# Patient Record
Sex: Female | Born: 1940 | Race: White | Hispanic: No | Smoking: Never smoker
Health system: Southern US, Community
[De-identification: ages and names within clinical notes are randomized; demographics above are authoritative.]

## PROBLEM LIST (undated history)

## (undated) DIAGNOSIS — E7800 Pure hypercholesterolemia, unspecified: Secondary | ICD-10-CM

## (undated) DIAGNOSIS — I10 Essential (primary) hypertension: Secondary | ICD-10-CM

## (undated) HISTORY — PX: PACEMAKER PLACEMENT: SHX43

---

## 2010-11-08 DIAGNOSIS — E785 Hyperlipidemia, unspecified: Secondary | ICD-10-CM | POA: Insufficient documentation

## 2010-11-08 DIAGNOSIS — I10 Essential (primary) hypertension: Secondary | ICD-10-CM | POA: Diagnosis present

## 2011-12-24 DIAGNOSIS — Z860100 Personal history of colon polyps, unspecified: Secondary | ICD-10-CM | POA: Insufficient documentation

## 2015-04-21 DIAGNOSIS — M8580 Other specified disorders of bone density and structure, unspecified site: Secondary | ICD-10-CM | POA: Insufficient documentation

## 2015-04-21 DIAGNOSIS — E039 Hypothyroidism, unspecified: Secondary | ICD-10-CM | POA: Insufficient documentation

## 2015-04-21 DIAGNOSIS — D509 Iron deficiency anemia, unspecified: Secondary | ICD-10-CM | POA: Insufficient documentation

## 2015-04-21 DIAGNOSIS — E7801 Familial hypercholesterolemia: Secondary | ICD-10-CM | POA: Insufficient documentation

## 2015-06-20 DIAGNOSIS — K219 Gastro-esophageal reflux disease without esophagitis: Secondary | ICD-10-CM | POA: Insufficient documentation

## 2017-07-10 DIAGNOSIS — R7301 Impaired fasting glucose: Secondary | ICD-10-CM | POA: Insufficient documentation

## 2017-11-19 DIAGNOSIS — I5021 Acute systolic (congestive) heart failure: Secondary | ICD-10-CM | POA: Insufficient documentation

## 2017-11-21 DIAGNOSIS — I429 Cardiomyopathy, unspecified: Secondary | ICD-10-CM

## 2017-11-22 DIAGNOSIS — Z9889 Other specified postprocedural states: Secondary | ICD-10-CM | POA: Insufficient documentation

## 2017-12-06 DIAGNOSIS — Z79899 Other long term (current) drug therapy: Secondary | ICD-10-CM | POA: Insufficient documentation

## 2017-12-06 DIAGNOSIS — R413 Other amnesia: Secondary | ICD-10-CM | POA: Diagnosis present

## 2017-12-16 DIAGNOSIS — G4700 Insomnia, unspecified: Secondary | ICD-10-CM | POA: Insufficient documentation

## 2018-01-10 DIAGNOSIS — E876 Hypokalemia: Secondary | ICD-10-CM

## 2018-01-10 DIAGNOSIS — Z7901 Long term (current) use of anticoagulants: Secondary | ICD-10-CM | POA: Insufficient documentation

## 2018-01-10 DIAGNOSIS — M62838 Other muscle spasm: Secondary | ICD-10-CM | POA: Insufficient documentation

## 2018-01-21 DIAGNOSIS — Z950 Presence of cardiac pacemaker: Secondary | ICD-10-CM | POA: Insufficient documentation

## 2019-07-22 ENCOUNTER — Emergency Department (HOSPITAL_COMMUNITY)
Admission: EM | Admit: 2019-07-22 | Discharge: 2019-07-23 | Disposition: A | Discharge status: Home / Self Care | Payer: Medicare Other | Attending: Emergency Medicine | Admitting: Emergency Medicine

## 2019-07-22 ENCOUNTER — Emergency Department (HOSPITAL_COMMUNITY): Payer: Medicare Other

## 2019-07-22 ENCOUNTER — Encounter (HOSPITAL_COMMUNITY): Payer: Self-pay

## 2019-07-22 DIAGNOSIS — S42292A Other displaced fracture of upper end of left humerus, initial encounter for closed fracture: Secondary | ICD-10-CM | POA: Insufficient documentation

## 2019-07-22 DIAGNOSIS — S43005A Unspecified dislocation of left shoulder joint, initial encounter: Secondary | ICD-10-CM | POA: Diagnosis not present

## 2019-07-22 DIAGNOSIS — W010XXA Fall on same level from slipping, tripping and stumbling without subsequent striking against object, initial encounter: Secondary | ICD-10-CM | POA: Insufficient documentation

## 2019-07-22 DIAGNOSIS — Y939 Activity, unspecified: Secondary | ICD-10-CM | POA: Insufficient documentation

## 2019-07-22 DIAGNOSIS — Y9222 Religious institution as the place of occurrence of the external cause: Secondary | ICD-10-CM | POA: Insufficient documentation

## 2019-07-22 DIAGNOSIS — Y999 Unspecified external cause status: Secondary | ICD-10-CM | POA: Diagnosis not present

## 2019-07-22 DIAGNOSIS — R52 Pain, unspecified: Secondary | ICD-10-CM

## 2019-07-22 DIAGNOSIS — Z950 Presence of cardiac pacemaker: Secondary | ICD-10-CM | POA: Insufficient documentation

## 2019-07-22 DIAGNOSIS — S4992XA Unspecified injury of left shoulder and upper arm, initial encounter: Secondary | ICD-10-CM | POA: Diagnosis present

## 2019-07-22 DIAGNOSIS — S0990XA Unspecified injury of head, initial encounter: Secondary | ICD-10-CM | POA: Diagnosis not present

## 2019-07-22 MED ORDER — OXYCODONE HCL 5 MG PO TABS: 5.0000 mg | ORAL_TABLET | Freq: Three times a day (TID) | ORAL | 0 refills | Status: DC | PRN

## 2019-07-22 MED ORDER — FENTANYL CITRATE (PF) 100 MCG/2ML IJ SOLN
50.0000 ug | Freq: Once | INTRAMUSCULAR | Status: AC
  Administered 2019-07-22: 50 ug via INTRAVENOUS
  Filled 2019-07-22: qty 2

## 2019-07-22 MED ORDER — ONDANSETRON HCL 4 MG/2ML IJ SOLN
4.0000 mg | Freq: Once | INTRAMUSCULAR | Status: AC
  Administered 2019-07-22: 22:00:00 4 mg via INTRAVENOUS
  Filled 2019-07-22: qty 2

## 2019-07-22 MED ORDER — PROPOFOL 10 MG/ML IV BOLUS
0.5000 mg/kg | Freq: Once | INTRAVENOUS | Status: AC
  Administered 2019-07-22: 25 mg via INTRAVENOUS
  Filled 2019-07-22: qty 20

## 2019-07-22 MED ORDER — PROPOFOL 10 MG/ML IV BOLUS
INTRAVENOUS | Status: AC | PRN
  Administered 2019-07-22: 20 mg via INTRAVENOUS
  Administered 2019-07-22 (×2): 25 mg via INTRAVENOUS
  Administered 2019-07-22: 20 mg via INTRAVENOUS

## 2019-07-22 MED ORDER — SODIUM CHLORIDE 0.9 % IV BOLUS
500.0000 mL | Freq: Once | INTRAVENOUS | Status: AC
  Administered 2019-07-22: 500 mL via INTRAVENOUS

## 2019-07-22 NOTE — ED Triage Notes (Signed)
[place]. Pt tripped on lip of door and fell, injuring left upper arm. No obvious deformity. Sling and cushion without relief. + PMS  170palp 100HR 20RR 96% RA 98.2

## 2019-07-22 NOTE — ED Notes (Signed)
Patient transported to X-ray 

## 2019-07-22 NOTE — ED Provider Notes (Addendum)
[facility]-EMERGENCY DEPT Provider Note   CSN: [id number] Arrival date & time: 07/22/19  1939     History Chief Complaint  Patient presents with  . Fall  . Arm Pain    Danielle Blanchard is a 79 y.o. female.  Patient with mechanical fall prior to arrival, landed on left elbow.  Pain to the entire left arm, thinks that she hit her head.  On blood thinners.  Mild headache. No neck pain.   The history is provided by the patient.  Fall This is a new problem. The current episode started less than 1 hour ago. The problem has not changed since onset.Associated symptoms include headaches. Pertinent negatives include no chest pain, no abdominal pain and no shortness of breath. Associated symptoms comments: Left arm pain. Exacerbated by: movement. Nothing relieves the symptoms. She has tried nothing for the symptoms. The treatment provided no relief.  Arm Pain Associated symptoms include headaches. Pertinent negatives include no chest pain, no abdominal pain and no shortness of breath.       History reviewed. No pertinent past medical history.  There are no problems to display for this patient.   Past Surgical History:  Procedure Laterality Date  . PACEMAKER PLACEMENT       OB History   No obstetric history on file.     No family history on file.  Social History   Tobacco Use  . Smoking status: Never Smoker  Substance Use Topics  . Alcohol use: Never  . Drug use: Never    Home Medications Prior to Admission medications   Medication Sig Start Date End Date Taking? Authorizing Provider  oxyCODONE (ROXICODONE) 5 MG immediate release tablet Take 1 tablet (5 mg total) by mouth every 8 (eight) hours as needed for up to 15 doses for severe pain or breakthrough pain. 07/22/19   Virgina Norfolk, DO    Allergies    Patient has no known allergies.  Review of Systems   Review of Systems  Constitutional: Negative for chills and fever.  HENT: Negative for ear  pain and sore throat.   Eyes: Negative for pain and visual disturbance.  Respiratory: Negative for cough and shortness of breath.   Cardiovascular: Negative for chest pain and palpitations.  Gastrointestinal: Negative for abdominal pain and vomiting.  Genitourinary: Negative for dysuria and hematuria.  Musculoskeletal: Positive for arthralgias. Negative for back pain, gait problem, joint swelling, myalgias, neck pain and neck stiffness.  Skin: Negative for color change and rash.  Neurological: Positive for headaches. Negative for seizures and syncope.  All other systems reviewed and are negative.   Physical Exam Updated Vital Signs  ED Triage Vitals  Enc Vitals Group     BP 07/22/19 1952 (!) 165/71     Pulse Rate 07/22/19 1952 60     Resp 07/22/19 1952 16     Temp 07/22/19 1952 98.3 F (36.8 C)     Temp Source 07/22/19 1952 Oral     SpO2 07/22/19 1946 96 %     Weight 07/22/19 1951 145 lb (65.8 kg)     Height 07/22/19 1951 5\' 2"  (1.575 m)     Head Circumference --      Peak Flow --      Pain Score 07/22/19 1951 10     Pain Loc --      Pain Edu? --      Excl. in GC? --     Physical Exam Vitals and nursing note reviewed.  Constitutional:      General: She is not in acute distress.    Appearance: She is well-developed. She is not ill-appearing.  HENT:     Head: Normocephalic and atraumatic.     Nose: Nose normal.     Mouth/Throat:     Mouth: Mucous membranes are moist.  Eyes:     Extraocular Movements: Extraocular movements intact.     Conjunctiva/sclera: Conjunctivae normal.     Pupils: Pupils are equal, round, and reactive to light.  Cardiovascular:     Rate and Rhythm: Normal rate and regular rhythm.     Pulses: Normal pulses.     Heart sounds: Normal heart sounds. No murmur.  Pulmonary:     Effort: Pulmonary effort is normal. No respiratory distress.     Breath sounds: Normal breath sounds.  Abdominal:     Palpations: Abdomen is soft.     Tenderness: There is  no abdominal tenderness.  Musculoskeletal:        General: Tenderness present. No deformity.     Cervical back: Neck supple. No tenderness.     Comments: Tenderness to left shoulder, left elbow, left wrist, overall difficult to assess LUE, decreased ROM of LUE due to pain   Skin:    General: Skin is warm and dry.  Neurological:     General: No focal deficit present.     Mental Status: She is alert and oriented to person, place, and time.     Cranial Nerves: No cranial nerve deficit.     Sensory: No sensory deficit.     Motor: No weakness.     Coordination: Coordination normal.     ED Results / Procedures / Treatments   Labs (all labs ordered are listed, but only abnormal results are displayed) Labs Reviewed - No data to display  EKG None  Radiology DG Chest 2 View  Result Date: 07/22/2019 CLINICAL DATA:  Trip and fall walking into church this evening. Left upper extremity pain most prominent involving the shoulder. 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 Heart size is grossly normal. No pneumothorax, large pleural effusion or focal airspace disease. Left shoulder fracture dislocation better assessed on dedicated imaging. No visualized rib fracture. IMPRESSION: 1. Left proximal humeral fracture dislocation. No other acute chest finding. 2. Large hiatal hernia. Electronically Signed   By: Narda Rutherford M.D.   On: 07/22/2019 21:25   DG Elbow Complete Left  Result Date: 07/22/2019 CLINICAL DATA:  Trip and fall walking into church this evening. Left upper extremity pain most prominent involving the shoulder. EXAM: LEFT ELBOW - COMPLETE 3+ VIEW COMPARISON:  None. FINDINGS: Lateral view is limited by positioning. No obvious joint effusion or acute fracture. No evidence of dislocation. Soft tissues are unremarkable. IMPRESSION: No fracture or subluxation of the left elbow.  Electronically Signed   By: Narda Rutherford M.D.   On: 07/22/2019 21:25   DG Wrist Complete Left  Result Date: 07/22/2019 CLINICAL DATA:  Trip and fall walking into church this evening. Left upper extremity pain most prominent involving the shoulder. EXAM: LEFT WRIST - COMPLETE 3+ VIEW COMPARISON:  None. FINDINGS: There is no evidence of fracture or dislocation. Osteoarthritis involving the radiocarpal joint and base of the thumb at the carpal metacarpal joint. Small erosion involving the ulna styloid. Soft tissues are unremarkable. IMPRESSION: 1. No fracture or subluxation of the left wrist. 2. Mild osteoarthritis. Erosion  involving the ulna styloid is nonspecific, however can be seen in the setting of inflammatory arthropathy such as rheumatoid arthritis. Recommend correlation for any pain prior to acute injury. Electronically Signed   By: Narda RutherfordMelanie  Sanford M.D.   On: 07/22/2019 21:27   CT Head Wo Contrast  Result Date: 07/22/2019 CLINICAL DATA:  Tripped and fell, left-sided head injury EXAM: CT HEAD WITHOUT CONTRAST TECHNIQUE: Contiguous axial images were obtained from the base of the skull through the vertex without intravenous contrast. COMPARISON:  None. FINDINGS: Brain: There is encephalomalacia right temporal lobe consistent with prior infarct. No signs of acute infarct or hemorrhage. Lateral ventricles and midline structures are unremarkable. No acute extra-axial fluid collections. No mass effect. Vascular: No hyperdense vessel or unexpected calcification. Skull: Normal. Negative for fracture or focal lesion. Sinuses/Orbits: No acute finding. Other: None. IMPRESSION: 1. Chronic right temporal lobe infarct. 2. No acute intracranial process. Electronically Signed   By: Sharlet SalinaMichael  Brown M.D.   On: 07/22/2019 21:30   DG Shoulder Left  Result Date: 07/22/2019 CLINICAL DATA:  Trip and fall walking into church this evening. Left upper extremity pain most prominent involving the shoulder. EXAM: LEFT  SHOULDER - 2+ VIEW COMPARISON:  None. FINDINGS: Displaced fracture of the proximal humerus with greater than 1 cm displacement of a 3.4 cm osseous fragment involving the lateral humeral head. There is anterior dislocation of the humeral head with respect to the glenoid. Acromioclavicular joint remains congruent. No fracture of the included left ribs. IMPRESSION: Fracture dislocation of the left shoulder. Anterior displacement of the humerus with comminuted and displaced proximal humeral fracture, greater than 1 cm displacement involves the lateral humeral head. Electronically Signed   By: Narda RutherfordMelanie  Sanford M.D.   On: 07/22/2019 21:23   DG Shoulder Left Port  Result Date: 07/22/2019 CLINICAL DATA:  Post reduction EXAM: LEFT SHOULDER COMPARISON:  07/22/2019 FINDINGS: Reduction of previously noted anterior shoulder dislocation, humeral head appears to articulate with the glenoid on the limited two views. Acute comminuted fracture involving the humeral neck and greater tuberosity. IMPRESSION: Reduction of previously noted humeral head dislocation. Acute comminuted proximal humerus fracture Electronically Signed   By: Jasmine PangKim  Fujinaga M.D.   On: 07/22/2019 23:10   DG Humerus Left  Result Date: 07/22/2019 CLINICAL DATA:  Trip and fall walking into church this evening. Left upper extremity pain most prominent involving the shoulder. EXAM: LEFT HUMERUS - 2+ VIEW COMPARISON:  None. FINDINGS: Anterior dislocation of the humerus with respect to the glenoid. Displaced and comminuted fracture involving the proximal humerus with the displaced fragment of the lateral humeral head. More distal humeral shaft is intact. IMPRESSION: Displaced and comminuted proximal humerus fracture with anterior shoulder dislocation. Electronically Signed   By: Narda RutherfordMelanie  Sanford M.D.   On: 07/22/2019 21:26    Procedures .Sedation  Date/Time: 07/22/2019 9:42 PM Performed by: Virgina Norfolkuratolo, Nathalya Wolanski, DO Authorized by: Virgina Norfolkuratolo, Jamylah Marinaccio, DO   Consent:      Consent obtained:  Written   Consent given by:  Patient   Risks discussed:  Allergic reaction, inadequate sedation, nausea, prolonged hypoxia resulting in organ damage, dysrhythmia, prolonged sedation necessitating reversal, respiratory compromise necessitating ventilatory assistance and intubation and vomiting   Alternatives discussed:  Analgesia without sedation Universal protocol:    Procedure explained and questions answered to patient or proxy's satisfaction: yes     Relevant documents present and verified: yes     Test results available and properly labeled: yes     Imaging studies available: yes  Immediately prior to procedure a time out was called: yes     Patient identity confirmation method:  Arm band, verbally with patient and hospital-assigned identification number Indications:    Procedure performed:  Fracture reduction (fracture/dislocation reduciton)   Procedure necessitating sedation performed by:  Physician performing sedation Pre-sedation assessment:    Time since last food or drink:  6 hours   ASA classification: class 2 - patient with mild systemic disease     Neck mobility: normal     Mouth opening:  3 or more finger widths   Thyromental distance:  4 finger widths   Mallampati score:  II - soft palate, uvula, fauces visible   Pre-sedation assessments completed and reviewed: airway patency, cardiovascular function, hydration status, mental status, nausea/vomiting, pain level, respiratory function and temperature     Pre-sedation assessment completed:  07/22/2019 9:43 PM Immediate pre-procedure details:    Reassessment: Patient reassessed immediately prior to procedure     Reviewed: vital signs, relevant labs/tests and NPO status     Verified: bag valve mask available, emergency equipment available, intubation equipment available, IV patency confirmed, oxygen available, reversal medications available and suction available   Procedure details (see MAR for exact  dosages):    Preoxygenation:  Nasal cannula   Sedation:  Propofol   Intended level of sedation: deep   Intra-procedure monitoring:  Blood pressure monitoring, cardiac monitor, continuous capnometry, continuous pulse oximetry, frequent LOC assessments and frequent vital sign checks   Intra-procedure events: none     Total Provider sedation time (minutes):  25 Post-procedure details:    Post-sedation assessment completed:  07/22/2019 11:10 PM   Attendance: Constant attendance by certified staff until patient recovered     Recovery: Patient returned to pre-procedure baseline     Post-sedation assessments completed and reviewed: airway patency, cardiovascular function, hydration status, mental status, nausea/vomiting, pain level, respiratory function and temperature     Patient is stable for discharge or admission: yes     Patient tolerance:  Tolerated well, no immediate complications  .Ortho Injury Treatment  Date/Time: 07/23/2019 1:01 AM Performed by: Lennice Sites, DO Authorized by: Lennice Sites, DO   Consent:    Consent obtained:  Written   Consent given by:  Patient   Risks discussed:  Irreducible dislocation, nerve damage, fracture, recurrent dislocation, stiffness, restricted joint movement and vascular damage   Alternatives discussed:  No treatmentInjury location: upper arm Location details: left upper arm Injury type: fracture-dislocation Pre-procedure neurovascular assessment: neurovascularly intact Pre-procedure distal perfusion: normal Pre-procedure neurological function: normal Pre-procedure range of motion: reduced  Anesthesia: Local anesthesia used: no  Patient sedated: Yes. Refer to sedation procedure documentation for details of sedation. Manipulation performed: yes Skin traction used: yes Skeletal traction used: yes Reduction successful: yes X-ray confirmed reduction: yes Immobilization: sling Post-procedure neurovascular assessment: post-procedure  neurovascularly intact Post-procedure distal perfusion: normal Post-procedure neurological function: normal Post-procedure range of motion: normal Patient tolerance: patient tolerated the procedure well with no immediate complications    (including critical care time)  Medications Ordered in ED Medications  fentaNYL (SUBLIMAZE) injection 50 mcg (50 mcg Intravenous Given 07/22/19 2041)  propofol (DIPRIVAN) 10 mg/mL bolus/IV push 32.9 mg (25 mg Intravenous Given 07/22/19 2228)  sodium chloride 0.9 % bolus 500 mL (500 mLs Intravenous New Bag/Given 07/22/19 2221)  ondansetron (ZOFRAN) injection 4 mg (4 mg Intravenous Given 07/22/19 2227)  propofol (DIPRIVAN) 10 mg/mL bolus/IV push (20 mg Intravenous Given 07/22/19 2250)    ED Course  I have reviewed the triage  vital signs and the nursing notes.  Pertinent labs & imaging results that were available during my care of the patient were reviewed by me and considered in my medical decision making (see chart for details).    MDM Rules/Calculators/A&P                      Danielle Blanchard is an 79 year old female who presents to the ED with left shoulder pain after a fall.  Patient on blood thinners.  Did not think she hit her head.  However states that she does not fully know.  No loss of consciousness.  Normal vitals.  No fever.  Mostly tenderness to the left shoulder area.  Decreased range of motion secondary to pain.  CT scan of the head was unremarkable.  X-ray of the left shoulder shows fracture dislocation of the shoulder.  Fractures at the proximal humeral head.  Shoulder and fracture were reduced under propofol sedation.  Talked with Dr. Ranell Patrick with orthopaedics who will follow up with the patient in a week.  Patient will wear sling, roxicodone for pain as well as tylenol and motrin.  This chart was dictated using voice recognition software.  Despite best efforts to proofread,  errors can occur which can change the documentation meaning.      Final Clinical Impression(s) / ED Diagnoses Final diagnoses:  Other closed displaced fracture of proximal end of left humerus, initial encounter  Dislocation of left shoulder joint, initial encounter    Rx / DC Orders ED Discharge Orders         Ordered    oxyCODONE (ROXICODONE) 5 MG immediate release tablet  Every 8 hours PRN     07/22/19 2345           Virgina Norfolk, DO 07/22/19 2346    Virgina Norfolk, DO 07/23/19 0102

## 2019-07-22 NOTE — ED Notes (Signed)
Pt is on eliquis, stated she hit the left side of her head.

## 2019-07-22 NOTE — Discharge Instructions (Addendum)
Wear sling at all times, no weightbearing to left upper extremity.  Use Tylenol for pain.  Use Roxicodone for breakthrough pain, be careful with use as this is a narcotic pain medicine, use as prescribed. Recommend ice as well to your shoulder as often as possible. Follow up with Dr. Ranell Patrick with orthopaedics in one week, call to confirm appointment.

## 2019-07-22 NOTE — ED Notes (Signed)
Rainbow sent to lab

## 2019-07-23 NOTE — ED Notes (Signed)
Patient's son informed of patient's discharge. Patient's son stated that he is coming to pick her up.

## 2019-12-05 ENCOUNTER — Emergency Department (HOSPITAL_COMMUNITY)
Admission: EM | Admit: 2019-12-05 | Discharge: 2019-12-06 | Disposition: A | Discharge status: Home / Self Care | Payer: Medicare Other | Attending: Emergency Medicine | Admitting: Emergency Medicine

## 2019-12-05 ENCOUNTER — Emergency Department (HOSPITAL_COMMUNITY): Payer: Medicare Other

## 2019-12-05 ENCOUNTER — Other Ambulatory Visit: Payer: Self-pay

## 2019-12-05 DIAGNOSIS — R079 Chest pain, unspecified: Secondary | ICD-10-CM | POA: Insufficient documentation

## 2019-12-05 DIAGNOSIS — K449 Diaphragmatic hernia without obstruction or gangrene: Secondary | ICD-10-CM

## 2019-12-05 DIAGNOSIS — Z950 Presence of cardiac pacemaker: Secondary | ICD-10-CM | POA: Diagnosis not present

## 2019-12-05 DIAGNOSIS — Z79899 Other long term (current) drug therapy: Secondary | ICD-10-CM | POA: Insufficient documentation

## 2019-12-05 DIAGNOSIS — R109 Unspecified abdominal pain: Secondary | ICD-10-CM | POA: Diagnosis present

## 2019-12-05 LAB — BASIC METABOLIC PANEL
Anion gap: 9 (ref 5–15)
BUN: 14 mg/dL (ref 8–23)
CO2: 25 mmol/L (ref 22–32)
Calcium: 8.8 mg/dL — ABNORMAL LOW (ref 8.9–10.3)
Chloride: 106 mmol/L (ref 98–111)
Creatinine, Ser: 0.87 mg/dL (ref 0.44–1.00)
GFR calc Af Amer: >60 mL/min (ref >60)
GFR calc non Af Amer: >60 mL/min (ref >60)
Glucose, Bld: 129 mg/dL — ABNORMAL HIGH (ref 70–99)
Potassium: 3.3 mmol/L — ABNORMAL LOW (ref 3.5–5.1)
Sodium: 140 mmol/L (ref 135–145)

## 2019-12-05 LAB — CBC
HCT: 34.3 % — ABNORMAL LOW (ref 36.0–46.0)
Hemoglobin: 10.9 g/dL — ABNORMAL LOW (ref 12.0–15.0)
MCH: 29.7 pg (ref 26.0–34.0)
MCHC: 31.8 g/dL (ref 30.0–36.0)
MCV: 93.5 fL (ref 80.0–100.0)
Platelets: 180 10*3/uL (ref 150–400)
RBC: 3.67 MIL/uL — ABNORMAL LOW (ref 3.87–5.11)
RDW: 14.8 % (ref 11.5–15.5)
WBC: 6.9 10*3/uL (ref 4.0–10.5)
nRBC: 0 % (ref 0.0–0.2)

## 2019-12-05 LAB — TROPONIN I (HIGH SENSITIVITY): Troponin I (High Sensitivity): 6 ng/L (ref <18)

## 2019-12-05 NOTE — ED Triage Notes (Addendum)
Pt presents to ED POV. Pt c/o CP that began at 2100. Pt was eating while eating. Pain began in abd, went to chest, back and neck. Pt EMS given nitro x2 and 10mg  morphine. Pt atrial paced, hx SSS  EMS VS -  138/78, intial 190/100 HR 98, intermittent atrial tach

## 2019-12-06 LAB — TROPONIN I (HIGH SENSITIVITY): Troponin I (High Sensitivity): 5 ng/L (ref <18)

## 2019-12-06 MED ORDER — PANTOPRAZOLE SODIUM 40 MG PO TBEC: 40.0000 mg | DELAYED_RELEASE_TABLET | Freq: Every day | ORAL | Status: DC

## 2019-12-06 MED ORDER — ONDANSETRON 4 MG PO TBDP
4.0000 mg | ORAL_TABLET | Freq: Once | ORAL | Status: AC | PRN
  Administered 2019-12-06: 4 mg via ORAL
  Filled 2019-12-06: qty 1

## 2019-12-06 MED ORDER — OMEPRAZOLE 40 MG PO CPDR: 40.0000 mg | DELAYED_RELEASE_CAPSULE | Freq: Every day | ORAL | 1 refills | Status: DC

## 2019-12-06 NOTE — Discharge Instructions (Signed)
1.  Take a 40 mg omeprazole capsule 30 minutes before breakfast every day. 2.  Follow dietary restrictions for low-fat foods, eat smaller meals more frequently. 3.  Make an appointment to see your doctor within the next week for recheck.  You may need referral to a gastroenterologist for an upper endoscopy. 4.  Return to emergency department immediately if you have recurrence of symptoms or new concerning symptoms develop.  Return if you get chest pressure or shortness of breath with walking or other symptoms suggesting heart problems.

## 2019-12-06 NOTE — ED Provider Notes (Signed)
[facility] EMERGENCY DEPARTMENT Provider Note   CSN: [id number] Arrival date & time: 12/05/19  2223     History Chief Complaint  Patient presents with  . Chest Pain    Danielle Blanchard is a 79 y.o. female.  HPI Patient ate dinner.  She had steak and cream potatoes and corn on the cob.  She had finished eating and just very shortly afterward started developing intense pain in her central upper abdomen.  She reports this started radiating slightly lower in her abdomen as well as up into her chest and shoulders.  Pain became very intense.  At that time EMS was called.  Patient denies any vomiting.  She did not have any choking sensation.  She reports she had a similar episode of month or more ago, she got evaluated at [facility].  She reports there did not seem to be any problems with her heart at that time and the episode was attributed to "food poisoning".  Patient reports that she has taken omeprazole in the past.  She is not sure if she takes it on a daily basis any longer.  Patient was treated with nitroglycerin and morphine by EMS.  Symptoms significantly improved.  At this time, patient denies she has any pain.    No past medical history on file.  There are no problems to display for this patient.   Past Surgical History:  Procedure Laterality Date  . PACEMAKER PLACEMENT       OB History   No obstetric history on file.     No family history on file.  Social History   Tobacco Use  . Smoking status: Never Smoker  Substance Use Topics  . Alcohol use: Never  . Drug use: Never    Home Medications Prior to Admission medications   Medication Sig Start Date End Date Taking? Authorizing Provider  oxyCODONE (ROXICODONE) 5 MG immediate release tablet Take 1 tablet (5 mg total) by mouth every 8 (eight) hours as needed for up to 15 doses for severe pain or breakthrough pain. 07/22/19   Virgina Norfolk, DO    Allergies    Patient has no known  allergies.  Review of Systems   Review of Systems 10 systems reviewed and negative except as per HPI Physical Exam Updated Vital Signs BP (!) 150/88   Pulse 78   Temp 98.3 F (36.8 C) (Oral)   Resp 16   SpO2 95%   Physical Exam Constitutional:      Appearance: She is well-developed.  HENT:     Head: Normocephalic and atraumatic.  Eyes:     Extraocular Movements: Extraocular movements intact.     Conjunctiva/sclera: Conjunctivae normal.  Cardiovascular:     Rate and Rhythm: Normal rate and regular rhythm.     Heart sounds: Normal heart sounds.  Pulmonary:     Effort: Pulmonary effort is normal.     Breath sounds: Normal breath sounds.  Abdominal:     General: Bowel sounds are normal. There is no distension.     Palpations: Abdomen is soft.     Tenderness: There is abdominal tenderness.     Comments: Patient has very mild epigastric discomfort to deep palpation.  No guarding.  No mass.  Musculoskeletal:        General: No swelling or tenderness. Normal range of motion.     Cervical back: Neck supple.     Right lower leg: No edema.     Left lower leg: No  edema.  Skin:    General: Skin is warm and dry.  Neurological:     Mental Status: She is alert and oriented to person, place, and time.     GCS: GCS eye subscore is 4. GCS verbal subscore is 5. GCS motor subscore is 6.     Coordination: Coordination normal.  Psychiatric:        Mood and Affect: Mood normal.     ED Results / Procedures / Treatments   Labs (all labs ordered are listed, but only abnormal results are displayed) Labs Reviewed  BASIC METABOLIC PANEL - Abnormal; Notable for the following components:      Result Value   Potassium 3.3 (*)    Glucose, Bld 129 (*)    Calcium 8.8 (*)    All other components within normal limits  CBC - Abnormal; Notable for the following components:   RBC 3.67 (*)    Hemoglobin 10.9 (*)    HCT 34.3 (*)    All other components within normal limits  TROPONIN I (HIGH  SENSITIVITY)  TROPONIN I (HIGH SENSITIVITY)    EKG EKG Interpretation  Date/Time:  Saturday December 05 2019 22:35:13 EDT Ventricular Rate:  61 PR Interval:  210 QRS Duration: 114 QT Interval:  494 QTC Calculation: 497 R Axis:   19 Text Interpretation: Atrial-paced rhythm with prolonged AV conduction Minimal voltage criteria for LVH, may be normal variant ( Cornell product ) Prolonged QT Abnormal ECG No old tracing to compare Confirmed by Drema Pry [phone]) on 12/06/2019 4:55:43 AM   Radiology DG Chest 2 View  Result Date: 12/05/2019 CLINICAL DATA:  Chest pain EXAM: CHEST - 2 VIEW COMPARISON:  07/22/2019 FINDINGS: Left pacer remains in place, unchanged. Large hiatal hernia, stable. Heart is normal size. Lungs clear. No effusions or acute bony abnormality. IMPRESSION: Large hiatal hernia.  No active disease. Electronically Signed   By: Charlett Nose M.D.   On: 12/05/2019 23:24    Procedures Procedures (including critical care time)  Medications Ordered in ED Medications  pantoprazole (PROTONIX) EC tablet 40 mg (has no administration in time range)  ondansetron (ZOFRAN-ODT) disintegrating tablet 4 mg (4 mg Oral Given 12/06/19 0121)    ED Course  I have reviewed the triage vital signs and the nursing notes.  Pertinent labs & imaging results that were available during my care of the patient were reviewed by me and considered in my medical decision making (see chart for details).    MDM Rules/Calculators/A&P                         Patient presents with an episode of severe pain that occurred after eating a large meal.  He does not describe any food impaction.  It started just shortly after completing the meal without difficulty.  Pain started in the central and epigastric abdomen and then began radiating to her central chest.  Patient did not at any time vomit.  Patient was treated by EMS with morphine and nitroglycerin.  She has had prolonged ED wait and pain has resolved.  At  this time, I have high suspicion for hiatal hernia and esophageal spasm as the cause of the patient's symptoms.  2 sets of troponin are normal.  Patient has an EKG with atrial paced and no ischemia.  Chest x-ray shows hiatal hernia but no other acute abnormality.  Abdominal exam has minimal tenderness.  At this time I have much lower suspicion for aneurysm or  dissection given complete resolution of symptoms which initially were severe.  Blood pressures only moderately elevated throughout ED stay.  EMS notes document a initial blood pressure of 190/100 but then subsequently 138/78.  The symptoms resolved and blood pressures hovering in 150s over 80s have low suspicion for hypertensive emergency.  At this time I feel patient stable for discharge.  She is to start omeprazole 40 mg daily and dietary adjustments for reflux and hiatal hernia.  Recommendations follow-up with PCP as well as GI.  Return precautions reviewed. Final Clinical Impression(s) / ED Diagnoses Final diagnoses:  Nonspecific chest pain  Hiatal hernia    Rx / DC Orders ED Discharge Orders    None       Arby Barrette, MD 12/06/19 1123

## 2019-12-06 NOTE — ED Notes (Signed)
Patient verbalizes understanding of discharge instructions. Opportunity for questioning and answers were provided. Armband removed by staff, pt discharged from ED to home with son 

## 2019-12-08 ENCOUNTER — Other Ambulatory Visit: Payer: Medicare Other

## 2019-12-08 ENCOUNTER — Other Ambulatory Visit: Payer: Self-pay

## 2019-12-08 DIAGNOSIS — Z20822 Contact with and (suspected) exposure to covid-19: Secondary | ICD-10-CM

## 2019-12-09 LAB — NOVEL CORONAVIRUS, NAA: SARS-CoV-2, NAA: DETECTED — AB

## 2019-12-10 ENCOUNTER — Telehealth: Payer: Self-pay | Admitting: *Deleted

## 2019-12-10 NOTE — Telephone Encounter (Signed)
Reviewed positive Covid results with the patient's son, Danielle Blanchard. The patient lives with Danielle Blanchard who is also Covid positive. Danielle Blanchard will relay health information and precautions that we discussed to his mother including monitoring her breathing for any changes, encouraging plenty of fluids to stay hydrated, washing hands frequently, disinfecting common areas often and not leaving her designated area with a mask on. Will notify [facility] of results.

## 2019-12-18 ENCOUNTER — Other Ambulatory Visit: Payer: Medicare Other

## 2019-12-18 ENCOUNTER — Other Ambulatory Visit: Payer: Self-pay | Admitting: *Deleted

## 2019-12-18 DIAGNOSIS — Z20822 Contact with and (suspected) exposure to covid-19: Secondary | ICD-10-CM

## 2019-12-21 LAB — NOVEL CORONAVIRUS, NAA: SARS-CoV-2, NAA: DETECTED — AB

## 2020-02-15 ENCOUNTER — Other Ambulatory Visit: Payer: Self-pay

## 2020-02-15 ENCOUNTER — Ambulatory Visit
Admission: EM | Admit: 2020-02-15 | Discharge: 2020-02-15 | Disposition: A | Discharge status: Home / Self Care | Payer: Medicare Other

## 2020-02-15 ENCOUNTER — Encounter: Payer: Self-pay | Admitting: Emergency Medicine

## 2020-02-15 DIAGNOSIS — H18891 Other specified disorders of cornea, right eye: Secondary | ICD-10-CM

## 2020-02-15 DIAGNOSIS — H0102A Squamous blepharitis right eye, upper and lower eyelids: Secondary | ICD-10-CM

## 2020-02-15 HISTORY — DX: Pure hypercholesterolemia, unspecified: E78.00

## 2020-02-15 HISTORY — DX: Essential (primary) hypertension: I10

## 2020-02-15 MED ORDER — SYSTANE 0.4-0.3 % OP GEL: 1.0000 "application " | Freq: Three times a day (TID) | OPHTHALMIC | 0 refills | Status: DC | PRN

## 2020-02-15 NOTE — ED Provider Notes (Signed)
[facility]    CSN: [id number] Arrival date & time: 02/15/20  1244      History   Chief Complaint Chief Complaint  Patient presents with  . Eye Problem    HPI Arlethia Basso is a 79 y.o. female.   79 year old female comes in for few week history of right eye irritation. States foreign body sensation with eye watering. Occasional crusting. Intermittent blurry vision that resolves with blinking. Denies photophobia, eye injury. Denies contact lens use. Has glasses for reading, but does not wear it.      Past Medical History:  Diagnosis Date  . High cholesterol   . Hypertension     There are no problems to display for this patient.   Past Surgical History:  Procedure Laterality Date  . PACEMAKER PLACEMENT      OB History   No obstetric history on file.      Home Medications    Prior to Admission medications   Medication Sig Start Date End Date Taking? Authorizing Provider  apixaban (ELIQUIS) 2.5 MG TABS tablet Take by mouth 2 (two) times daily.   Yes [provider]  carvedilol (COREG) 12.5 MG tablet Take 12.5 mg by mouth 2 (two) times daily with a meal.   Yes [provider]  losartan (COZAAR) 50 MG tablet Take 50 mg by mouth daily.   Yes [provider]  rosuvastatin (CRESTOR) 40 MG tablet Take 40 mg by mouth daily.   Yes [provider]  omeprazole (PRILOSEC) 40 MG capsule Take 1 capsule (40 mg total) by mouth daily. Take 1 tablet in the morning 30 minutes before you eat.  Take daily. 12/06/19   Arby Barrette, MD  Polyethyl Glycol-Propyl Glycol (SYSTANE) 0.4-0.3 % GEL ophthalmic gel Place 1 application into both eyes 3 (three) times daily as needed. 02/15/20   Belinda Fisher, PA-C    Family History No family history on file.  Social History Social History   Tobacco Use  . Smoking status: Never Smoker  . Smokeless tobacco: Never Used  Substance Use Topics  . Alcohol use: Never  . Drug use: Never      Allergies   Patient has no known allergies.   Review of Systems Review of Systems  Reason unable to perform ROS: See HPI as above.     Physical Exam Triage Vital Signs ED Triage Vitals  Enc Vitals Group     BP 02/15/20 1305 114/68     Pulse Rate 02/15/20 1305 68     Resp 02/15/20 1305 16     Temp 02/15/20 1305 98.3 F (36.8 C)     Temp Source 02/15/20 1305 Oral     SpO2 02/15/20 1305 96 %     Weight --      Height --      Head Circumference --      Peak Flow --      Pain Score 02/15/20 1301 5     Pain Loc --      Pain Edu? --      Excl. in GC? --    No data found.  Updated Vital Signs BP 114/68 (BP Location: Left Arm)   Pulse 68   Temp 98.3 F (36.8 C) (Oral)   Resp 16   SpO2 96%   Visual Acuity Right Eye Distance:   20/30 ([place]) Left Eye Distance:   20/30 ([place]) Bilateral Distance:    Right Eye Near:   Left Eye Near:  Bilateral Near:     Physical Exam Constitutional:      General: She is not in acute distress.    Appearance: She is well-developed. She is not ill-appearing, toxic-appearing or diaphoretic.  HENT:     Head: Normocephalic and atraumatic.  Eyes:     Pupils: Pupils are equal, round, and reactive to light.      Comments: Temporal right conjunctival injection. EOMi. PERRLA.   Neurological:     Mental Status: She is alert and oriented to person, place, and time.      UC Treatments / Results  Labs (all labs ordered are listed, but only abnormal results are displayed) Labs Reviewed - No data to display  EKG   Radiology No results found.  Procedures Procedures (including critical care time)  Medications Ordered in UC Medications - No data to display  Initial Impression / Assessment and Plan / UC Course  I have reviewed the triage vital signs and the nursing notes.  Pertinent labs & imaging results that were available during my care of the patient were reviewed by me and considered in my medical decision making (see chart  for details).    Discussed history and exam most consistent with dry eyes/blepharitis. Also ? Blocked tear duct given swelling to the nasal aspect of the eye. For now, will start artificial tears/gel and do warm compresses. Ophthalmology follow up this week. Return precautions given.  Final Clinical Impressions(s) / UC Diagnoses   Final diagnoses:  Corneal irritation of right eye  Squamous blepharitis of upper and lower eyelids of both eyes    ED Prescriptions    Medication Sig Dispense Auth. Provider   Polyethyl Glycol-Propyl Glycol (SYSTANE) 0.4-0.3 % GEL ophthalmic gel Place 1 application into both eyes 3 (three) times daily as needed. 8 mL Belinda Fisher, PA-C     PDMP not reviewed this encounter.   Belinda Fisher, PA-C 02/15/20 1350

## 2020-02-15 NOTE — Discharge Instructions (Signed)
Artificial tear gel as needed. Warm compresses as directed. Follow up with eye doctor this week for recheckMonitor for redness/pain/swelling to the corner of the eye. Monitor for any worsening of symptoms, changes in vision, sensitivity to light, eye swelling, painful eye movement, follow up with ophthalmology for further evaluation.

## 2020-02-15 NOTE — ED Triage Notes (Signed)
States her R eye has been irritated for several weeks. States it feels like something is in it and it is watery. Endorses slightly blurred vision to the R eye

## 2020-12-27 ENCOUNTER — Ambulatory Visit (HOSPITAL_BASED_OUTPATIENT_CLINIC_OR_DEPARTMENT_OTHER)
Admission: RE | Admit: 2020-12-27 | Discharge: 2020-12-27 | Disposition: A | Discharge status: Home / Self Care | Payer: Medicare Other | Source: Ambulatory Visit | Attending: Emergency Medicine | Admitting: Emergency Medicine

## 2020-12-27 ENCOUNTER — Ambulatory Visit
Admission: EM | Admit: 2020-12-27 | Discharge: 2020-12-27 | Disposition: A | Discharge status: Home / Self Care | Payer: Medicare Other | Attending: Emergency Medicine | Admitting: Emergency Medicine

## 2020-12-27 ENCOUNTER — Other Ambulatory Visit: Payer: Self-pay

## 2020-12-27 DIAGNOSIS — M47816 Spondylosis without myelopathy or radiculopathy, lumbar region: Secondary | ICD-10-CM | POA: Diagnosis not present

## 2020-12-27 DIAGNOSIS — M4316 Spondylolisthesis, lumbar region: Secondary | ICD-10-CM | POA: Diagnosis present

## 2020-12-27 DIAGNOSIS — M545 Low back pain, unspecified: Secondary | ICD-10-CM

## 2020-12-27 MED ORDER — DICLOFENAC SODIUM 2 % EX SOLN: 2.0000 | Freq: Two times a day (BID) | CUTANEOUS | 0 refills | Status: DC

## 2020-12-27 MED ORDER — METHYLPREDNISOLONE ACETATE 80 MG/ML IJ SUSP
80.0000 mg | Freq: Once | INTRAMUSCULAR | Status: AC
  Administered 2020-12-27: 80 mg via INTRAMUSCULAR

## 2020-12-27 MED ORDER — METHOCARBAMOL 500 MG PO TABS: 500.0000 mg | ORAL_TABLET | Freq: Every evening | ORAL | 0 refills | Status: DC | PRN

## 2020-12-27 NOTE — ED Triage Notes (Addendum)
Pt reports having a fall about a week ago and now has lower back pain (dull/ sharp) for a bout a week (mid back pain).

## 2020-12-27 NOTE — Discharge Instructions (Signed)
We gave you a shot of Depo-Medrol Tylenol as needed Topical diclofenac solution to back May use over-the-counter lidocaine patches Robaxin at bedtime-this is a muscle relaxer, may cause drowsiness Continue to move, avoid bedrest Please follow-up if not improving or worsening

## 2020-12-28 ENCOUNTER — Encounter (HOSPITAL_BASED_OUTPATIENT_CLINIC_OR_DEPARTMENT_OTHER): Payer: Self-pay

## 2020-12-28 ENCOUNTER — Other Ambulatory Visit: Payer: Self-pay

## 2020-12-28 ENCOUNTER — Emergency Department (HOSPITAL_BASED_OUTPATIENT_CLINIC_OR_DEPARTMENT_OTHER)
Admission: EM | Admit: 2020-12-28 | Discharge: 2020-12-28 | Disposition: A | Discharge status: Home / Self Care | Payer: Medicare Other | Attending: Student | Admitting: Student

## 2020-12-28 ENCOUNTER — Emergency Department (HOSPITAL_BASED_OUTPATIENT_CLINIC_OR_DEPARTMENT_OTHER): Payer: Medicare Other

## 2020-12-28 DIAGNOSIS — S3210XA Unspecified fracture of sacrum, initial encounter for closed fracture: Secondary | ICD-10-CM | POA: Diagnosis not present

## 2020-12-28 DIAGNOSIS — S3992XA Unspecified injury of lower back, initial encounter: Secondary | ICD-10-CM | POA: Diagnosis present

## 2020-12-28 DIAGNOSIS — M545 Low back pain, unspecified: Secondary | ICD-10-CM

## 2020-12-28 DIAGNOSIS — I10 Essential (primary) hypertension: Secondary | ICD-10-CM | POA: Insufficient documentation

## 2020-12-28 DIAGNOSIS — W19XXXA Unspecified fall, initial encounter: Secondary | ICD-10-CM | POA: Diagnosis not present

## 2020-12-28 DIAGNOSIS — R1011 Right upper quadrant pain: Secondary | ICD-10-CM | POA: Diagnosis not present

## 2020-12-28 DIAGNOSIS — Z79899 Other long term (current) drug therapy: Secondary | ICD-10-CM | POA: Insufficient documentation

## 2020-12-28 LAB — CBC WITH DIFFERENTIAL/PLATELET
Abs Immature Granulocytes: 0.03 10*3/uL (ref 0.00–0.07)
Basophils Absolute: 0 10*3/uL (ref 0.0–0.1)
Basophils Relative: 0 %
Eosinophils Absolute: 0.2 10*3/uL (ref 0.0–0.5)
Eosinophils Relative: 2 %
HCT: 35.8 % — ABNORMAL LOW (ref 36.0–46.0)
Hemoglobin: 11.9 g/dL — ABNORMAL LOW (ref 12.0–15.0)
Immature Granulocytes: 0 %
Lymphocytes Relative: 11 %
Lymphs Abs: 0.8 10*3/uL (ref 0.7–4.0)
MCH: 30.7 pg (ref 26.0–34.0)
MCHC: 33.2 g/dL (ref 30.0–36.0)
MCV: 92.5 fL (ref 80.0–100.0)
Monocytes Absolute: 0.6 10*3/uL (ref 0.1–1.0)
Monocytes Relative: 8 %
Neutro Abs: 5.9 10*3/uL (ref 1.7–7.7)
Neutrophils Relative %: 79 %
Platelets: 163 10*3/uL (ref 150–400)
RBC: 3.87 MIL/uL (ref 3.87–5.11)
RDW: 13.5 % (ref 11.5–15.5)
WBC: 7.5 10*3/uL (ref 4.0–10.5)
nRBC: 0 % (ref 0.0–0.2)

## 2020-12-28 LAB — COMPREHENSIVE METABOLIC PANEL
ALT: 26 U/L (ref 0–44)
AST: 29 U/L (ref 15–41)
Albumin: 4.2 g/dL (ref 3.5–5.0)
Alkaline Phosphatase: 81 U/L (ref 38–126)
Anion gap: 9 (ref 5–15)
BUN: 16 mg/dL (ref 8–23)
CO2: 29 mmol/L (ref 22–32)
Calcium: 9.2 mg/dL (ref 8.9–10.3)
Chloride: 96 mmol/L — ABNORMAL LOW (ref 98–111)
Creatinine, Ser: 0.96 mg/dL (ref 0.44–1.00)
GFR, Estimated: 59 mL/min — ABNORMAL LOW (ref >60)
Glucose, Bld: 114 mg/dL — ABNORMAL HIGH (ref 70–99)
Potassium: 3.6 mmol/L (ref 3.5–5.1)
Sodium: 134 mmol/L — ABNORMAL LOW (ref 135–145)
Total Bilirubin: 0.8 mg/dL (ref 0.3–1.2)
Total Protein: 7.7 g/dL (ref 6.5–8.1)

## 2020-12-28 LAB — LIPASE, BLOOD: Lipase: 26 U/L (ref 11–51)

## 2020-12-28 MED ORDER — IOHEXOL 350 MG/ML SOLN
80.0000 mL | Freq: Once | INTRAVENOUS | Status: AC | PRN
  Administered 2020-12-28: 80 mL via INTRAVENOUS

## 2020-12-28 MED ORDER — ACETAMINOPHEN 500 MG PO TABS
1000.0000 mg | ORAL_TABLET | Freq: Once | ORAL | Status: AC
  Administered 2020-12-28: 1000 mg via ORAL
  Filled 2020-12-28: qty 2

## 2020-12-28 NOTE — ED Triage Notes (Signed)
Per pt and son, pt c/o lower back pain x 2-3 day-abd pain today-denies n/v/d-seen at [facility] yesterday for back pain-NAD-to triage in w/c

## 2020-12-28 NOTE — ED Provider Notes (Addendum)
[facility] EMERGENCY DEPARTMENT Provider Note   CSN: [id number] Arrival date & time: 12/28/20  1909     History Chief Complaint  Patient presents with   Back Pain   Abdominal Pain    Danielle Blanchard is a 80 y.o. female with PMH HLD, HTN who presents the emergency department for evaluation of back pain and abdominal pain.  Patient seen in urgent care yesterday with negative traumatic lumbar x-rays and was ultimately discharged with a steroid regimen, but the patient has not picked these medications up yet and has not been started on them.  She represents today because of new abdominal pain that is present in a bandlike distribution over the right and left turns.  She endorses pain over the tailbone after a fall last week.  Denies chest pain, shortness of breath, cough, headache, fever or other systemic symptoms.   Back Pain Associated symptoms: abdominal pain   Associated symptoms: no chest pain, no dysuria and no fever   Abdominal Pain Associated symptoms: no chest pain, no chills, no cough, no dysuria, no fever, no hematuria, no shortness of breath, no sore throat and no vomiting       Past Medical History:  Diagnosis Date   High cholesterol    Hypertension     There are no problems to display for this patient.   Past Surgical History:  Procedure Laterality Date   PACEMAKER PLACEMENT       OB History   No obstetric history on file.     No family history on file.  Social History   Tobacco Use   Smoking status: Never   Smokeless tobacco: Never  Vaping Use   Vaping Use: Never used  Substance Use Topics   Alcohol use: Never   Drug use: Never    Home Medications Prior to Admission medications   Medication Sig Start Date End Date Taking? Authorizing Provider  apixaban (ELIQUIS) 2.5 MG TABS tablet Take by mouth 2 (two) times daily.    [provider]  carvedilol (COREG) 12.5 MG tablet Take 12.5 mg by mouth 2 (two) times daily with a meal.     [provider]  Diclofenac Sodium (PENNSAID) 2 % SOLN Apply 2 Pump topically 2 (two) times daily. 12/27/20   Wieters, Hallie C, PA-C  losartan (COZAAR) 50 MG tablet Take 50 mg by mouth daily.    [provider]  methocarbamol (ROBAXIN) 500 MG tablet Take 1 tablet (500 mg total) by mouth at bedtime as needed for muscle spasms. 12/27/20   Wieters, Hallie C, PA-C  omeprazole (PRILOSEC) 40 MG capsule Take 1 capsule (40 mg total) by mouth daily. Take 1 tablet in the morning 30 minutes before you eat.  Take daily. 12/06/19   Arby Barrette, MD  Polyethyl Glycol-Propyl Glycol (SYSTANE) 0.4-0.3 % GEL ophthalmic gel Place 1 application into both eyes 3 (three) times daily as needed. 02/15/20   Cathie Hoops, Amy V, PA-C  rosuvastatin (CRESTOR) 40 MG tablet Take 40 mg by mouth daily.    [provider]    Allergies    Patient has no known allergies.  Review of Systems   Review of Systems  Constitutional:  Negative for chills and fever.  HENT:  Negative for ear pain and sore throat.   Eyes:  Negative for pain and visual disturbance.  Respiratory:  Negative for cough and shortness of breath.   Cardiovascular:  Negative for chest pain and palpitations.  Gastrointestinal:  Positive for abdominal pain. Negative  for vomiting.  Genitourinary:  Negative for dysuria and hematuria.  Musculoskeletal:  Positive for back pain. Negative for arthralgias.  Skin:  Negative for color change and rash.  Neurological:  Negative for seizures and syncope.  All other systems reviewed and are negative.  Physical Exam Updated Vital Signs BP (!) 178/78 (BP Location: Left Arm)   Pulse 68   Temp 98.4 F (36.9 C) (Oral)   Resp 20   SpO2 99%   Physical Exam Vitals and nursing note reviewed.  Constitutional:      General: She is not in acute distress.    Appearance: She is well-developed.  HENT:     Head: Normocephalic and atraumatic.  Eyes:     Conjunctiva/sclera: Conjunctivae normal.   Cardiovascular:     Rate and Rhythm: Normal rate and regular rhythm.     Heart sounds: No murmur heard. Pulmonary:     Effort: Pulmonary effort is normal. No respiratory distress.     Breath sounds: Normal breath sounds.  Abdominal:     Palpations: Abdomen is soft.     Tenderness: There is abdominal tenderness (Mild epigastric, right upper quadrant).  Musculoskeletal:        General: Tenderness (L-spine, sacrum) present.     Cervical back: Neck supple.  Skin:    General: Skin is warm and dry.  Neurological:     Mental Status: She is alert.    ED Results / Procedures / Treatments   Labs (all labs ordered are listed, but only abnormal results are displayed) Labs Reviewed  COMPREHENSIVE METABOLIC PANEL - Abnormal; Notable for the following components:      Result Value   Sodium 134 (*)    Chloride 96 (*)    Glucose, Bld 114 (*)    GFR, Estimated 59 (*)    All other components within normal limits  CBC WITH DIFFERENTIAL/PLATELET - Abnormal; Notable for the following components:   Hemoglobin 11.9 (*)    HCT 35.8 (*)    All other components within normal limits  LIPASE, BLOOD    EKG None  Radiology DG Lumbar Spine 2-3 Views  Result Date: 12/27/2020 CLINICAL DATA:  Back pain after fall EXAM: LUMBAR SPINE - 2-3 VIEW COMPARISON:  None. FINDINGS: Grade 1 anterolisthesis L4 on L5. Lumbar vertebra demonstrate grossly normal stature. Minimal wedging at T12 of uncertain age. Diffuse degenerative changes. Mild disc space narrowing at L1-L2, L4-L5 and L5-S1. Facet degenerative changes at multiple levels. IMPRESSION: 1. Minimal wedging deformity at T12 of uncertain age. 2. No acute osseous abnormality of the lumbar spine. Grade 1 anterolisthesis L4 on L5 with multilevel degenerative change Electronically Signed   By: Jasmine Pang M.D.   On: 12/27/2020 17:54   CT ABDOMEN PELVIS W CONTRAST  Result Date: 12/28/2020 CLINICAL DATA:  Lower back pain for several days, right upper quadrant  pain EXAM: CT ABDOMEN AND PELVIS WITH CONTRAST TECHNIQUE: Multidetector CT imaging of the abdomen and pelvis was performed using the standard protocol following bolus administration of intravenous contrast. CONTRAST:  43mL OMNIPAQUE IOHEXOL 350 MG/ML SOLN COMPARISON:  None. FINDINGS: Lower chest: No acute pleural or parenchymal lung disease. There is a large hiatal hernia containing the stomach as well as portions of the transverse colon. Hepatobiliary: No focal liver abnormality is seen. Status post cholecystectomy. No biliary dilatation. Pancreas: Unremarkable. No pancreatic ductal dilatation or surrounding inflammatory changes. Spleen: Normal in size without focal abnormality. Adrenals/Urinary Tract: Adrenal glands are unremarkable. Kidneys are normal, without renal calculi, focal lesion,  or hydronephrosis. Bladder is unremarkable. Stomach/Bowel: No bowel obstruction or ileus. Normal appendix central lower abdomen. No bowel wall thickening or inflammatory change. As discussed above, there is a large hiatal hernia containing portions of the transverse colon as well as the majority of the stomach. Vascular/Lymphatic: Aortic atherosclerosis. No enlarged abdominal or pelvic lymph nodes. Reproductive: Uterus and bilateral adnexa are unremarkable. Other: No free fluid or free gas. Small fat containing left inguinal hernia. Musculoskeletal: Stable age indeterminate wedge compression deformity T12. Nonspecific sclerosis within the S1 vertebral body could reflect an insufficiency fracture given underlying osteopenia. Prominent facet hypertrophy throughout the lower lumbar spine greatest at L4-5 and L5-S1. Mild anterolisthesis of L4 on L5 due to degenerative change. Reconstructed images demonstrate no additional findings. IMPRESSION: 1. Large hiatal hernia containing the stomach and a portion of the mid transverse colon. No obstruction. 2. Small fat containing left inguinal hernia. 3. Sclerosis within the S1 vertebral body  which could reflect osteoporotic insufficiency fracture. Stable age-indeterminate T12 anterior wedge compression deformity. 4.  Aortic Atherosclerosis (ICD10-I70.0). Electronically Signed   By: Sharlet Salina M.D.   On: 12/28/2020 22:36   CT L-SPINE NO CHARGE  Result Date: 12/28/2020 CLINICAL DATA:  Low back pain EXAM: CT LUMBAR SPINE WITHOUT CONTRAST TECHNIQUE: Multidetector CT imaging of the lumbar spine was performed without intravenous contrast administration. Multiplanar CT image reconstructions were also generated. COMPARISON:  None. FINDINGS: Segmentation: 5 lumbar type vertebrae. Alignment: Grade 1 anterolisthesis at L4-5 Vertebrae: Mild height loss at T12.  No acute fracture. Paraspinal and other soft tissues: Large hiatal hernia Disc levels: L1-2: No spinal canal stenosis. L2-3: Small disc bulge without spinal canal or neural foraminal stenosis. L3-4: Mild facet hypertrophy without spinal canal stenosis. L4-5: Grade 1 anterolisthesis with severe facet hypertrophy. No stenosis. L5-S1: No spinal canal or neural foraminal stenosis. IMPRESSION: 1. No acute fracture or static subluxation of the lumbar spine. 2. Grade 1 anterolisthesis at L4-5 with severe facet arthrosis, which may be a source of local low back pain. 3. Large hiatal hernia. Electronically Signed   By: Deatra Robinson M.D.   On: 12/28/2020 22:20    Procedures Procedures   Medications Ordered in ED Medications  acetaminophen (TYLENOL) tablet 1,000 mg (1,000 mg Oral Given 12/28/20 2055)  iohexol (OMNIPAQUE) 350 MG/ML injection 80 mL (80 mLs Intravenous Contrast Given 12/28/20 2144)    ED Course  I have reviewed the triage vital signs and the nursing notes.  Pertinent labs & imaging results that were available during my care of the patient were reviewed by me and considered in my medical decision making (see chart for details).    MDM Rules/Calculators/A&P                           For evaluation of back pain and abdominal pain  after a fall.  Physical exam with tenderness over the L-spine and in the sacrum, mild epigastric and right upper quadrant tenderness to palpation on abdominal exam.  Laboratory evaluation largely unremarkable outside of mild hyponatremia 134, hypochloremia 96.  CT abdomen pelvis with a large hiatal hernia, small fat-containing left inguinal hernia and a possible S1 osteoporotic insufficiency fracture as well as an age-indeterminate T12 anterior wedge compression deformity.  CT L-spine recon with grade 1 anterolisthesis at L4-L5 with severe facet arthrosis.  Patient's pain controlled well with Tylenol and I spoke with her son to encourage both of them to follow-up with her primary care physician to ensure  proper pain control as these findings will not require surgery but will require outpatient follow-up.  She was then discharged with outpatient follow-up with instructions to take Tylenol 3 times daily for her pain.  She is also instructed not to fill the prescription for steroids as she is an 80 year old female at high risk for osteoporosis and in the setting of a possible osteoporotic fracture this medication is not appropriate for her. Final Clinical Impression(s) / ED Diagnoses Final diagnoses:  Lumbar back pain  Closed fracture of sacrum, unspecified portion of sacrum, initial encounter [facility])    Rx / DC Orders ED Discharge Orders     None        Raylei Losurdo, Wyn Forster, MD 12/28/20 2343    Glendora Score, MD 12/28/20 2344

## 2020-12-28 NOTE — Discharge Instructions (Signed)
You were seen in the emergency department for evaluation of back pain after a fall.  Your CAT scans today show evidence of a possible small osteoporotic fracture in the sacrum at the level of S1 that likely explains your back pain.  There is also some chronic narrowing of the nerve roots at the L5 level that may also be responsible for the pain.  At this time, these fractures do not require surgery and you are safe for discharge.  Please continue to take Tylenol 3 times daily, 1000 mg for pain control and call your family medicine doctor for follow-up of your pain.

## 2020-12-28 NOTE — ED Provider Notes (Signed)
[facility]    CSN: [id number] Arrival date & time: 12/27/20  1527      History   Chief Complaint Chief Complaint  Patient presents with   Back Pain    HPI Danielle Blanchard is a 80 y.o. female history of hypertension presenting today for evaluation of back pain.  Reports that she has had back pain over the past week.  Does report falling approximately 1 week ago as well as she has been moving into a new apartment.  Reports pain in her lower back and goes across.  Denies radiation into lower legs.  Denies numbness or tingling.  Denies urinary symptoms.  Patient reports being on Eliquis.  HPI  Past Medical History:  Diagnosis Date   High cholesterol    Hypertension     There are no problems to display for this patient.   Past Surgical History:  Procedure Laterality Date   PACEMAKER PLACEMENT      OB History   No obstetric history on file.      Home Medications    Prior to Admission medications   Medication Sig Start Date End Date Taking? Authorizing Provider  Diclofenac Sodium (PENNSAID) 2 % SOLN Apply 2 Pump topically 2 (two) times daily. 12/27/20  Yes Aislee Landgren C, PA-C  methocarbamol (ROBAXIN) 500 MG tablet Take 1 tablet (500 mg total) by mouth at bedtime as needed for muscle spasms. 12/27/20  Yes Jahid Weida C, PA-C  apixaban (ELIQUIS) 2.5 MG TABS tablet Take by mouth 2 (two) times daily.    [provider]  carvedilol (COREG) 12.5 MG tablet Take 12.5 mg by mouth 2 (two) times daily with a meal.    [provider]  losartan (COZAAR) 50 MG tablet Take 50 mg by mouth daily.    [provider]  omeprazole (PRILOSEC) 40 MG capsule Take 1 capsule (40 mg total) by mouth daily. Take 1 tablet in the morning 30 minutes before you eat.  Take daily. 12/06/19   Arby Barrette, MD  Polyethyl Glycol-Propyl Glycol (SYSTANE) 0.4-0.3 % GEL ophthalmic gel Place 1 application into both eyes 3 (three) times daily as needed. 02/15/20   Cathie Hoops, Amy V,  PA-C  rosuvastatin (CRESTOR) 40 MG tablet Take 40 mg by mouth daily.    [provider]    Family History History reviewed. No pertinent family history.  Social History Social History   Tobacco Use   Smoking status: Never   Smokeless tobacco: Never  Substance Use Topics   Alcohol use: Never   Drug use: Never     Allergies   Patient has no known allergies.   Review of Systems Review of Systems  Constitutional:  Negative for fatigue and fever.  Eyes:  Negative for visual disturbance.  Respiratory:  Negative for shortness of breath.   Cardiovascular:  Negative for chest pain.  Gastrointestinal:  Negative for abdominal pain, nausea and vomiting.  Musculoskeletal:  Positive for back pain and myalgias. Negative for arthralgias and joint swelling.  Skin:  Negative for color change, rash and wound.  Neurological:  Negative for dizziness, weakness, light-headedness and headaches.    Physical Exam Triage Vital Signs ED Triage Vitals  Enc Vitals Group     BP 12/27/20 1549 124/77     Pulse Rate 12/27/20 1549 61     Resp 12/27/20 1549 20     Temp 12/27/20 1549 99 F (37.2 C)     Temp Source 12/27/20 1549 Oral     SpO2  12/27/20 1549 95 %     Weight --      Height --      Head Circumference --      Peak Flow --      Pain Score 12/27/20 1545 10     Pain Loc --      Pain Edu? --      Excl. in GC? --    No data found.  Updated Vital Signs BP 124/77 (BP Location: Left Arm)   Pulse 61   Temp 99 F (37.2 C) (Oral)   Resp 20   SpO2 95%   Visual Acuity Right Eye Distance:   Left Eye Distance:   Bilateral Distance:    Right Eye Near:   Left Eye Near:    Bilateral Near:     Physical Exam Vitals and nursing note reviewed.  Constitutional:      Appearance: She is well-developed.     Comments: No acute distress  HENT:     Head: Normocephalic and atraumatic.     Nose: Nose normal.  Eyes:     Conjunctiva/sclera: Conjunctivae normal.  Cardiovascular:      Rate and Rhythm: Normal rate.  Pulmonary:     Effort: Pulmonary effort is normal. No respiratory distress.  Abdominal:     General: There is no distension.  Musculoskeletal:        General: Normal range of motion.     Cervical back: Neck supple.     Comments: Tenderness to palpation along lumbar spine midline diffusely and bilateral paraspinal musculature, no palpable deformity or step-off  Skin:    General: Skin is warm and dry.  Neurological:     Mental Status: She is alert and oriented to person, place, and time.     UC Treatments / Results  Labs (all labs ordered are listed, but only abnormal results are displayed) Labs Reviewed - No data to display  EKG   Radiology DG Lumbar Spine 2-3 Views  Result Date: 12/27/2020 CLINICAL DATA:  Back pain after fall EXAM: LUMBAR SPINE - 2-3 VIEW COMPARISON:  None. FINDINGS: Grade 1 anterolisthesis L4 on L5. Lumbar vertebra demonstrate grossly normal stature. Minimal wedging at T12 of uncertain age. Diffuse degenerative changes. Mild disc space narrowing at L1-L2, L4-L5 and L5-S1. Facet degenerative changes at multiple levels. IMPRESSION: 1. Minimal wedging deformity at T12 of uncertain age. 2. No acute osseous abnormality of the lumbar spine. Grade 1 anterolisthesis L4 on L5 with multilevel degenerative change Electronically Signed   By: Jasmine Pang M.D.   On: 12/27/2020 17:54    Procedures Procedures (including critical care time)  Medications Ordered in UC Medications  methylPREDNISolone acetate (DEPO-MEDROL) injection 80 mg (80 mg Intramuscular Given 12/27/20 1643)    Initial Impression / Assessment and Plan / UC Course  I have reviewed the triage vital signs and the nursing notes.  Pertinent labs & imaging results that were available during my care of the patient were reviewed by me and considered in my medical decision making (see chart for details).     Lumbar back pain-lumbar x-ray pending, will call with results, on  Eliquis, will avoid NSAIDs, provided Depo-Medrol prior to discharge, Tylenol as needed, topical diclofenac, lidocaine patches and Robaxin at bedtime.  Encouraged movement.  Discussed possible follow-up with orthopedics if persistent.  Called son with x-ray results, verbalized understanding.  Discussed strict return precautions. Patient verbalized understanding and is agreeable with plan.  Final Clinical Impressions(s) / UC Diagnoses   Final diagnoses:  Acute bilateral low back pain without sciatica     Discharge Instructions      We gave you a shot of Depo-Medrol Tylenol as needed Topical diclofenac solution to back May use over-the-counter lidocaine patches Robaxin at bedtime-this is a muscle relaxer, may cause drowsiness Continue to move, avoid bedrest Please follow-up if not improving or worsening     ED Prescriptions     Medication Sig Dispense Auth. Provider   Diclofenac Sodium (PENNSAID) 2 % SOLN Apply 2 Pump topically 2 (two) times daily. 112 g Carrolyn Hilmes C, PA-C   methocarbamol (ROBAXIN) 500 MG tablet Take 1 tablet (500 mg total) by mouth at bedtime as needed for muscle spasms. 20 tablet Sumie Remsen, [place] C, PA-C      PDMP not reviewed this encounter.   Lew Dawes, [place] 12/28/20 1118

## 2021-02-11 ENCOUNTER — Inpatient Hospital Stay (HOSPITAL_COMMUNITY)
Admission: EM | Admit: 2021-02-11 | Discharge: 2021-02-16 | DRG: 392 | Disposition: A | Discharge status: Home / Self Care | Payer: Medicare Other | Attending: Internal Medicine | Admitting: Internal Medicine

## 2021-02-11 ENCOUNTER — Other Ambulatory Visit: Payer: Self-pay

## 2021-02-11 ENCOUNTER — Encounter (HOSPITAL_COMMUNITY): Payer: Self-pay | Admitting: Emergency Medicine

## 2021-02-11 ENCOUNTER — Emergency Department (HOSPITAL_COMMUNITY): Payer: Medicare Other

## 2021-02-11 DIAGNOSIS — I509 Heart failure, unspecified: Secondary | ICD-10-CM

## 2021-02-11 DIAGNOSIS — I5032 Chronic diastolic (congestive) heart failure: Secondary | ICD-10-CM | POA: Diagnosis present

## 2021-02-11 DIAGNOSIS — K3189 Other diseases of stomach and duodenum: Secondary | ICD-10-CM | POA: Diagnosis not present

## 2021-02-11 DIAGNOSIS — K449 Diaphragmatic hernia without obstruction or gangrene: Secondary | ICD-10-CM

## 2021-02-11 DIAGNOSIS — K219 Gastro-esophageal reflux disease without esophagitis: Secondary | ICD-10-CM | POA: Diagnosis present

## 2021-02-11 DIAGNOSIS — Z7901 Long term (current) use of anticoagulants: Secondary | ICD-10-CM

## 2021-02-11 DIAGNOSIS — Z20822 Contact with and (suspected) exposure to covid-19: Secondary | ICD-10-CM | POA: Diagnosis present

## 2021-02-11 DIAGNOSIS — E876 Hypokalemia: Secondary | ICD-10-CM

## 2021-02-11 DIAGNOSIS — Z950 Presence of cardiac pacemaker: Secondary | ICD-10-CM

## 2021-02-11 DIAGNOSIS — Z79899 Other long term (current) drug therapy: Secondary | ICD-10-CM

## 2021-02-11 DIAGNOSIS — I4891 Unspecified atrial fibrillation: Secondary | ICD-10-CM

## 2021-02-11 DIAGNOSIS — I110 Hypertensive heart disease with heart failure: Secondary | ICD-10-CM | POA: Diagnosis present

## 2021-02-11 DIAGNOSIS — E7800 Pure hypercholesterolemia, unspecified: Secondary | ICD-10-CM | POA: Diagnosis present

## 2021-02-11 DIAGNOSIS — I495 Sick sinus syndrome: Secondary | ICD-10-CM

## 2021-02-11 DIAGNOSIS — M4854XA Collapsed vertebra, not elsewhere classified, thoracic region, initial encounter for fracture: Secondary | ICD-10-CM | POA: Diagnosis present

## 2021-02-11 DIAGNOSIS — R042 Hemoptysis: Secondary | ICD-10-CM

## 2021-02-11 DIAGNOSIS — R1013 Epigastric pain: Secondary | ICD-10-CM

## 2021-02-11 LAB — COMPREHENSIVE METABOLIC PANEL
ALT: 27 U/L (ref 0–44)
AST: 31 U/L (ref 15–41)
Albumin: 4.3 g/dL (ref 3.5–5.0)
Alkaline Phosphatase: 87 U/L (ref 38–126)
Anion gap: 7 (ref 5–15)
BUN: 17 mg/dL (ref 8–23)
CO2: 27 mmol/L (ref 22–32)
Calcium: 9.1 mg/dL (ref 8.9–10.3)
Chloride: 102 mmol/L (ref 98–111)
Creatinine, Ser: 0.95 mg/dL (ref 0.44–1.00)
GFR, Estimated: 60 mL/min — ABNORMAL LOW (ref >60)
Glucose, Bld: 120 mg/dL — ABNORMAL HIGH (ref 70–99)
Potassium: 2.6 mmol/L — CL (ref 3.5–5.1)
Sodium: 136 mmol/L (ref 135–145)
Total Bilirubin: 0.5 mg/dL (ref 0.3–1.2)
Total Protein: 7.5 g/dL (ref 6.5–8.1)

## 2021-02-11 LAB — CBC WITH DIFFERENTIAL/PLATELET
Abs Immature Granulocytes: 0.02 10*3/uL (ref 0.00–0.07)
Basophils Absolute: 0 10*3/uL (ref 0.0–0.1)
Basophils Relative: 1 %
Eosinophils Absolute: 0.1 10*3/uL (ref 0.0–0.5)
Eosinophils Relative: 2 %
HCT: 34.5 % — ABNORMAL LOW (ref 36.0–46.0)
Hemoglobin: 11.6 g/dL — ABNORMAL LOW (ref 12.0–15.0)
Immature Granulocytes: 0 %
Lymphocytes Relative: 22 %
Lymphs Abs: 1.3 10*3/uL (ref 0.7–4.0)
MCH: 31.2 pg (ref 26.0–34.0)
MCHC: 33.6 g/dL (ref 30.0–36.0)
MCV: 92.7 fL (ref 80.0–100.0)
Monocytes Absolute: 0.5 10*3/uL (ref 0.1–1.0)
Monocytes Relative: 9 %
Neutro Abs: 3.9 10*3/uL (ref 1.7–7.7)
Neutrophils Relative %: 66 %
Platelets: 159 10*3/uL (ref 150–400)
RBC: 3.72 MIL/uL — ABNORMAL LOW (ref 3.87–5.11)
RDW: 15 % (ref 11.5–15.5)
WBC: 5.8 10*3/uL (ref 4.0–10.5)
nRBC: 0 % (ref 0.0–0.2)

## 2021-02-11 LAB — LIPASE, BLOOD: Lipase: 42 U/L (ref 11–51)

## 2021-02-11 MED ORDER — POTASSIUM CHLORIDE 10 MEQ/100ML IV SOLN
10.0000 meq | INTRAVENOUS | Status: AC
  Administered 2021-02-11 – 2021-02-12 (×2): 10 meq via INTRAVENOUS
  Filled 2021-02-11 (×2): qty 100

## 2021-02-11 MED ORDER — FENTANYL CITRATE PF 50 MCG/ML IJ SOSY
50.0000 ug | PREFILLED_SYRINGE | Freq: Once | INTRAMUSCULAR | Status: AC
  Administered 2021-02-11: 50 ug via INTRAVENOUS
  Filled 2021-02-11: qty 1

## 2021-02-11 MED ORDER — IOHEXOL 350 MG/ML SOLN
80.0000 mL | Freq: Once | INTRAVENOUS | Status: AC | PRN
  Administered 2021-02-11: 80 mL via INTRAVENOUS

## 2021-02-11 NOTE — ED Notes (Signed)
Patient transported to CT 

## 2021-02-11 NOTE — ED Triage Notes (Addendum)
Pt from home c/o intermittent LUQ abdominal pain x 2 months. Pain radiates to RUQ and to lower abdomen. Denies radiation to back, shob, n/v, fevers, cough.

## 2021-02-11 NOTE — ED Notes (Signed)
Patient returned from CT

## 2021-02-11 NOTE — ED Provider Notes (Addendum)
[facility] DEPT Provider Note   CSN: [id number] Arrival date & time: 02/11/21  2003     History Chief Complaint  Patient presents with   Abdominal Pain    Danielle Blanchard is a 80 y.o. female.  The history is provided by the patient, a relative and medical records. No language interpreter was used.  Abdominal Pain Pain location:  Epigastric Pain quality: aching   Pain radiates to:  Does not radiate Pain severity:  Severe Onset quality:  Gradual Duration:  3 days Timing:  Constant Progression:  Waxing and waning Chronicity:  Recurrent Context: eating   Relieved by:  Nothing Worsened by:  Nothing Ineffective treatments:  None tried Associated symptoms: constipation and fatigue   Associated symptoms: no chest pain, no chills, no cough, no diarrhea, no dysuria, no fever, no nausea, no shortness of breath and no vomiting       Past Medical History:  Diagnosis Date   High cholesterol    Hypertension     There are no problems to display for this patient.   Past Surgical History:  Procedure Laterality Date   PACEMAKER PLACEMENT       OB History   No obstetric history on file.     No family history on file.  Social History   Tobacco Use   Smoking status: Never   Smokeless tobacco: Never  Vaping Use   Vaping Use: Never used  Substance Use Topics   Alcohol use: Never   Drug use: Never    Home Medications Prior to Admission medications   Medication Sig Start Date End Date Taking? Authorizing Provider  apixaban (ELIQUIS) 2.5 MG TABS tablet Take by mouth 2 (two) times daily.    [provider]  carvedilol (COREG) 12.5 MG tablet Take 12.5 mg by mouth 2 (two) times daily with a meal.    [provider]  Diclofenac Sodium (PENNSAID) 2 % SOLN Apply 2 Pump topically 2 (two) times daily. 12/27/20   Wieters, Hallie C, PA-C  losartan (COZAAR) 50 MG tablet Take 50 mg by mouth daily.    [provider]   methocarbamol (ROBAXIN) 500 MG tablet Take 1 tablet (500 mg total) by mouth at bedtime as needed for muscle spasms. 12/27/20   Wieters, Hallie C, PA-C  omeprazole (PRILOSEC) 40 MG capsule Take 1 capsule (40 mg total) by mouth daily. Take 1 tablet in the morning 30 minutes before you eat.  Take daily. 12/06/19   Charlesetta Shanks, MD  Polyethyl Glycol-Propyl Glycol (SYSTANE) 0.4-0.3 % GEL ophthalmic gel Place 1 application into both eyes 3 (three) times daily as needed. 02/15/20   Tasia Catchings, Amy V, PA-C  rosuvastatin (CRESTOR) 40 MG tablet Take 40 mg by mouth daily.    [provider]    Allergies    Patient has no known allergies.  Review of Systems   Review of Systems  Constitutional:  Positive for fatigue. Negative for chills and fever.  HENT:  Negative for congestion.   Eyes:  Negative for visual disturbance.  Respiratory:  Negative for cough, chest tightness and shortness of breath.   Cardiovascular:  Negative for chest pain.  Gastrointestinal:  Positive for abdominal pain and constipation. Negative for abdominal distention, diarrhea, nausea and vomiting.  Genitourinary:  Negative for dysuria and flank pain.  Musculoskeletal:  Negative for neck pain.  Skin:  Negative for rash and wound.  Neurological:  Negative for light-headedness and headaches.  Psychiatric/Behavioral:  Negative for agitation and confusion.  All other systems reviewed and are negative.  Physical Exam Updated Vital Signs BP 135/69   Pulse 62   Temp 97.9 F (36.6 C) (Oral)   Resp 13   SpO2 100%   Physical Exam Vitals and nursing note reviewed.  Constitutional:      General: She is not in acute distress.    Appearance: She is well-developed. She is not ill-appearing, toxic-appearing or diaphoretic.  HENT:     Head: Normocephalic and atraumatic.  Eyes:     Conjunctiva/sclera: Conjunctivae normal.  Cardiovascular:     Rate and Rhythm: Normal rate and regular rhythm.     Heart sounds: No murmur  heard. Pulmonary:     Effort: Pulmonary effort is normal. No respiratory distress.     Breath sounds: Normal breath sounds.  Abdominal:     General: Abdomen is flat. Bowel sounds are normal. There is no distension.     Palpations: Abdomen is soft.     Tenderness: There is abdominal tenderness in the epigastric area.  Musculoskeletal:     Cervical back: Neck supple.  Skin:    General: Skin is warm and dry.     Capillary Refill: Capillary refill takes less than 2 seconds.  Neurological:     General: No focal deficit present.     Mental Status: She is alert.  Psychiatric:        Mood and Affect: Mood normal.    ED Results / Procedures / Treatments   Labs (all labs ordered are listed, but only abnormal results are displayed) Labs Reviewed  COMPREHENSIVE METABOLIC PANEL - Abnormal; Notable for the following components:      Result Value   Potassium 2.6 (*)    Glucose, Bld 120 (*)    GFR, Estimated 60 (*)    All other components within normal limits  CBC WITH DIFFERENTIAL/PLATELET - Abnormal; Notable for the following components:   RBC 3.72 (*)    Hemoglobin 11.6 (*)    HCT 34.5 (*)    All other components within normal limits  RESP PANEL BY RT-PCR (FLU A&B, COVID) ARPGX2  LIPASE, BLOOD  URINALYSIS, ROUTINE W REFLEX MICROSCOPIC    EKG EKG Interpretation  Date/Time:  Saturday February 11 2021 20:17:42 EST Ventricular Rate:  60 PR Interval:  208 QRS Duration: 114 QT Interval:  485 QTC Calculation: 485 R Axis:   36 Text Interpretation: Atrial-paced rhythm Borderline intraventricular conduction delay Borderline low voltage, extremity leads Minimal ST depression, anterolateral leads When compared to prior, similar appearance. NO STEMI Confirmed by Antony Blackbird [phone]) on 02/11/2021 9:46:05 PM  Radiology CT ABDOMEN PELVIS W CONTRAST  Result Date: 02/11/2021 CLINICAL DATA:  Evaluate for bowel obstruction. Left upper quadrant abdominal pain. EXAM: CT ABDOMEN AND PELVIS  WITH CONTRAST TECHNIQUE: Multidetector CT imaging of the abdomen and pelvis was performed using the standard protocol following bolus administration of intravenous contrast. CONTRAST:  40mL OMNIPAQUE IOHEXOL 350 MG/ML SOLN COMPARISON:  CT abdomen and pelvis 12/28/2020. FINDINGS: Lower chest: No acute abnormality. Hepatobiliary: No focal liver abnormality is seen. Status post cholecystectomy. No biliary dilatation. Pancreas: Unremarkable. No pancreatic ductal dilatation or surrounding inflammatory changes. Spleen: Normal in size without focal abnormality. Adrenals/Urinary Tract: Adrenal glands are unremarkable. Kidneys are normal, without renal calculi, focal lesion, or hydronephrosis. Bladder is unremarkable. Stomach/Bowel: There is sigmoid colon diverticulosis without evidence for diverticulitis. The appendix is likely surgically absent. Small bowel and colon are nondilated. Again seen is a large hiatal hernia. The gastric body and fundus are  now below the diaphragm, new finding. The gastric antrum remains within the hernia. The intrathoracic and intra-abdominal portions of the stomach are dilated with air-fluid levels. There is no pneumatosis or free air. Vascular/Lymphatic: Aortic atherosclerosis. No enlarged abdominal or pelvic lymph nodes. Reproductive: Uterus and bilateral adnexa are unremarkable. Other: No abdominal wall hernia or abnormality. There is a small fat containing left inguinal hernia. There is no ascites. Musculoskeletal: T12 inferior endplate compression fracture has increased when compared to the prior study. IMPRESSION: 1. Large hiatal hernia is again seen. In the interval, the gastric fundus has change locations and is now intraabdominal while the gastric antrum remains in the hernia. Both intrathoracic and intra-abdominal portions of the stomach are dilated with air-fluid levels. 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 Surgical consultation recommended. 2. T12 compression  fracture has increased compared to 12/28/2020. Electronically Signed   By: Ronney Asters M.D.   On: 02/11/2021 23:25    Procedures Procedures   Medications Ordered in ED Medications  potassium chloride 10 mEq in 100 mL IVPB (10 mEq Intravenous New Bag/Given 02/11/21 2309)  fentaNYL (SUBLIMAZE) injection 50 mcg (has no administration in time range)  iohexol (OMNIPAQUE) 350 MG/ML injection 80 mL (80 mLs Intravenous Contrast Given 02/11/21 2254)    ED Course  I have reviewed the triage vital signs and the nursing notes.  Pertinent labs & imaging results that were available during my care of the patient were reviewed by me and considered in my medical decision making (see chart for details).    MDM Rules/Calculators/A&P                           Danielle Blanchard is a 80 y.o. female with a past medical history significant for pacemaker, hypertension, hypercholesterolemia as well as previous hiatal hernia who presents with worsened abdominal pain.  According to patient, she has been having pain on and off for several months but acutely worsened over the last few days.  She reports no nausea, vomiting, or diarrhea.  She denies constipation but it appears she has had visits for constipation recently.  Patient reports the pain is moderate to severe and in the epigastric and upper abdomen primarily.  She is denying chest pain for me.  No shortness of breath reported.  She denies any fevers, chills, congestion, cough, COVID or flu symptoms.  On exam, abdomen is tender to palpation in the upper abdomen primarily.  Lower abdomen was less tender.  Bowel sounds were appreciated.  Lungs were otherwise clear.  Chest nontender.  Intact pulses in extremities.  Clinically I am concerned the patient may have bowel obstruction or some abnormality with her hiatal hernia.  Will give pain medicine and get a CT scan and labs.  Anticipate reassessment after work-up is completed to determine disposition.   11:28  PM CT scan just returned showing evidence air-fluid levels in the stomach and hiatal hernia area with transition from they were last time suspicious for likely gastric volvulus and obstruction.  Surgery consult placed.  Anticipate admission.  11:49 PM Just spoke with Dr. Ninfa Linden with general surgery and he recommends admission to medicine and likely GI consult during the admission.  He does feel that an NG tube needs to be placed or at least have an attempt at placement to try to decompress.  We discussed that this might be a challenge given the concern for acute obstruction and volvulus at the stomach but he requested  an attempt.  Secure chat message sent to GI team to be involved and see patient tomorrow.  NG order placed and patient will be admitted.   Final Clinical Impression(s) / ED Diagnoses Final diagnoses:  Epigastric pain  Acute gastric volvulus   Clinical Impression: 1. Epigastric pain   2. Acute gastric volvulus     Disposition: Admit  This note was prepared with assistance of Dragon voice recognition software. Occasional wrong-word or sound-a-like substitutions may have occurred due to the inherent limitations of voice recognition software.     Isaih Bulger, Canary Brim, MD 02/12/21 0015    Adylee Leonardo, Canary Brim, MD 02/12/21 0030

## 2021-02-12 ENCOUNTER — Encounter (HOSPITAL_COMMUNITY): Payer: Self-pay | Admitting: Internal Medicine

## 2021-02-12 DIAGNOSIS — Z950 Presence of cardiac pacemaker: Secondary | ICD-10-CM | POA: Diagnosis not present

## 2021-02-12 DIAGNOSIS — K3189 Other diseases of stomach and duodenum: Secondary | ICD-10-CM | POA: Diagnosis present

## 2021-02-12 DIAGNOSIS — R1013 Epigastric pain: Secondary | ICD-10-CM | POA: Diagnosis not present

## 2021-02-12 DIAGNOSIS — I495 Sick sinus syndrome: Secondary | ICD-10-CM | POA: Diagnosis present

## 2021-02-12 DIAGNOSIS — I4891 Unspecified atrial fibrillation: Secondary | ICD-10-CM

## 2021-02-12 DIAGNOSIS — I110 Hypertensive heart disease with heart failure: Secondary | ICD-10-CM | POA: Diagnosis present

## 2021-02-12 DIAGNOSIS — I509 Heart failure, unspecified: Secondary | ICD-10-CM | POA: Diagnosis not present

## 2021-02-12 DIAGNOSIS — E876 Hypokalemia: Secondary | ICD-10-CM | POA: Diagnosis present

## 2021-02-12 DIAGNOSIS — K449 Diaphragmatic hernia without obstruction or gangrene: Principal | ICD-10-CM

## 2021-02-12 DIAGNOSIS — Z79899 Other long term (current) drug therapy: Secondary | ICD-10-CM | POA: Diagnosis not present

## 2021-02-12 DIAGNOSIS — E7800 Pure hypercholesterolemia, unspecified: Secondary | ICD-10-CM | POA: Diagnosis present

## 2021-02-12 DIAGNOSIS — K219 Gastro-esophageal reflux disease without esophagitis: Secondary | ICD-10-CM | POA: Diagnosis present

## 2021-02-12 DIAGNOSIS — Z7901 Long term (current) use of anticoagulants: Secondary | ICD-10-CM | POA: Diagnosis not present

## 2021-02-12 DIAGNOSIS — I5032 Chronic diastolic (congestive) heart failure: Secondary | ICD-10-CM | POA: Diagnosis present

## 2021-02-12 DIAGNOSIS — M4854XA Collapsed vertebra, not elsewhere classified, thoracic region, initial encounter for fracture: Secondary | ICD-10-CM | POA: Diagnosis present

## 2021-02-12 DIAGNOSIS — Z20822 Contact with and (suspected) exposure to covid-19: Secondary | ICD-10-CM | POA: Diagnosis present

## 2021-02-12 LAB — COMPREHENSIVE METABOLIC PANEL
ALT: 22 U/L (ref 0–44)
AST: 23 U/L (ref 15–41)
Albumin: 3.4 g/dL — ABNORMAL LOW (ref 3.5–5.0)
Alkaline Phosphatase: 70 U/L (ref 38–126)
Anion gap: 7 (ref 5–15)
BUN: 13 mg/dL (ref 8–23)
CO2: 28 mmol/L (ref 22–32)
Calcium: 8.4 mg/dL — ABNORMAL LOW (ref 8.9–10.3)
Chloride: 103 mmol/L (ref 98–111)
Creatinine, Ser: 0.69 mg/dL (ref 0.44–1.00)
GFR, Estimated: >60 mL/min (ref >60)
Glucose, Bld: 88 mg/dL (ref 70–99)
Potassium: 3.5 mmol/L (ref 3.5–5.1)
Sodium: 138 mmol/L (ref 135–145)
Total Bilirubin: 0.4 mg/dL (ref 0.3–1.2)
Total Protein: 6 g/dL — ABNORMAL LOW (ref 6.5–8.1)

## 2021-02-12 LAB — CBC WITH DIFFERENTIAL/PLATELET
Abs Immature Granulocytes: 0.02 10*3/uL (ref 0.00–0.07)
Basophils Absolute: 0 10*3/uL (ref 0.0–0.1)
Basophils Relative: 1 %
Eosinophils Absolute: 0.1 10*3/uL (ref 0.0–0.5)
Eosinophils Relative: 2 %
HCT: 30.8 % — ABNORMAL LOW (ref 36.0–46.0)
Hemoglobin: 10.3 g/dL — ABNORMAL LOW (ref 12.0–15.0)
Immature Granulocytes: 1 %
Lymphocytes Relative: 22 %
Lymphs Abs: 1 10*3/uL (ref 0.7–4.0)
MCH: 31.3 pg (ref 26.0–34.0)
MCHC: 33.4 g/dL (ref 30.0–36.0)
MCV: 93.6 fL (ref 80.0–100.0)
Monocytes Absolute: 0.4 10*3/uL (ref 0.1–1.0)
Monocytes Relative: 10 %
Neutro Abs: 2.8 10*3/uL (ref 1.7–7.7)
Neutrophils Relative %: 64 %
Platelets: 142 10*3/uL — ABNORMAL LOW (ref 150–400)
RBC: 3.29 MIL/uL — ABNORMAL LOW (ref 3.87–5.11)
RDW: 15 % (ref 11.5–15.5)
WBC: 4.4 10*3/uL (ref 4.0–10.5)
nRBC: 0 % (ref 0.0–0.2)

## 2021-02-12 LAB — URINALYSIS, ROUTINE W REFLEX MICROSCOPIC
Bacteria, UA: NONE SEEN
Bilirubin Urine: NEGATIVE
Glucose, UA: NEGATIVE mg/dL
Hgb urine dipstick: NEGATIVE
Ketones, ur: NEGATIVE mg/dL
Leukocytes,Ua: NEGATIVE
Nitrite: NEGATIVE
Protein, ur: 30 mg/dL — AB
Specific Gravity, Urine: 1.015 (ref 1.005–1.030)
pH: 5 (ref 5.0–8.0)

## 2021-02-12 LAB — APTT
aPTT: 35 seconds (ref 24–36)
aPTT: >200 seconds (ref 24–36)

## 2021-02-12 LAB — RESP PANEL BY RT-PCR (FLU A&B, COVID) ARPGX2
Influenza A by PCR: NEGATIVE
Influenza B by PCR: NEGATIVE
SARS Coronavirus 2 by RT PCR: NEGATIVE

## 2021-02-12 LAB — HEPARIN LEVEL (UNFRACTIONATED): Heparin Unfractionated: >1.1 IU/mL — ABNORMAL HIGH (ref 0.30–0.70)

## 2021-02-12 LAB — MAGNESIUM
Magnesium: 2.2 mg/dL (ref 1.7–2.4)
Magnesium: 2 mg/dL (ref 1.7–2.4)

## 2021-02-12 LAB — PROTIME-INR
INR: 1.5 — ABNORMAL HIGH (ref 0.8–1.2)
Prothrombin Time: 18 seconds — ABNORMAL HIGH (ref 11.4–15.2)

## 2021-02-12 MED ORDER — POTASSIUM CHLORIDE 10 MEQ/100ML IV SOLN
10.0000 meq | INTRAVENOUS | Status: AC
  Administered 2021-02-12 (×3): 10 meq via INTRAVENOUS
  Filled 2021-02-12 (×3): qty 100

## 2021-02-12 MED ORDER — HEPARIN (PORCINE) 25000 UT/250ML-% IV SOLN
950.0000 [IU]/h | INTRAVENOUS | Status: DC
  Administered 2021-02-12: 950 [IU]/h via INTRAVENOUS
  Filled 2021-02-12: qty 250

## 2021-02-12 MED ORDER — HYDRALAZINE HCL 20 MG/ML IJ SOLN: 5.0000 mg | Freq: Four times a day (QID) | INTRAMUSCULAR | Status: DC | PRN

## 2021-02-12 MED ORDER — SODIUM CHLORIDE 0.9 % IV SOLN: INTRAVENOUS | Status: AC

## 2021-02-12 MED ORDER — POTASSIUM CHLORIDE 10 MEQ/100ML IV SOLN
10.0000 meq | INTRAVENOUS | Status: AC
  Administered 2021-02-12 (×5): 10 meq via INTRAVENOUS
  Filled 2021-02-12 (×5): qty 100

## 2021-02-12 MED ORDER — MORPHINE SULFATE (PF) 2 MG/ML IV SOLN
1.0000 mg | INTRAVENOUS | Status: DC | PRN
  Administered 2021-02-12 – 2021-02-15 (×3): 1 mg via INTRAVENOUS
  Filled 2021-02-12 (×3): qty 1

## 2021-02-12 MED ORDER — SODIUM CHLORIDE 0.9 % IV SOLN: INTRAVENOUS | Status: DC | PRN

## 2021-02-12 MED ORDER — HEPARIN BOLUS VIA INFUSION
3500.0000 [IU] | Freq: Once | INTRAVENOUS | Status: AC
  Administered 2021-02-12: 3500 [IU] via INTRAVENOUS
  Filled 2021-02-12: qty 3500

## 2021-02-12 MED ORDER — SODIUM CHLORIDE 0.9 % IV SOLN: INTRAVENOUS | Status: DC

## 2021-02-12 MED ORDER — POTASSIUM CHLORIDE 10 MEQ/100ML IV SOLN
10.0000 meq | INTRAVENOUS | Status: DC
  Administered 2021-02-12: 10 meq via INTRAVENOUS
  Filled 2021-02-12: qty 100

## 2021-02-12 MED ORDER — HEPARIN (PORCINE) 25000 UT/250ML-% IV SOLN
700.0000 [IU]/h | INTRAVENOUS | Status: DC
  Administered 2021-02-12: 800 [IU]/h via INTRAVENOUS
  Filled 2021-02-12: qty 250

## 2021-02-12 MED ORDER — PANTOPRAZOLE SODIUM 40 MG IV SOLR
40.0000 mg | INTRAVENOUS | Status: DC
  Administered 2021-02-12 – 2021-02-16 (×5): 40 mg via INTRAVENOUS
  Filled 2021-02-12 (×5): qty 40

## 2021-02-12 NOTE — Progress Notes (Signed)
NG insertion attempted met resistance pt request sedation for a 2nd attempt.

## 2021-02-12 NOTE — Consult Note (Signed)
CC: abdominal pain  Requesting provider: Dr Shela Leff  HPI: Danielle Blanchard is an 80 y.o. female who presented to the emergency room last evening with abdominal pain.  She has a history of chronic abdominal pain but over the last several days she reports that the pain in the left upper quadrant is increased.  She denied nausea or vomiting.  She has a difficult time quantifying her discomfort.  This morning she is currently without pain.  She had undergone a CAT scan of the abdomen and pelvis which showed a large hiatal hernia and a question of a gastric volvulus.  The hiatal hernia also containing transverse colon.  The stomach was dilated above and below the diaphragm but contrast did pass through.  A nasogastric tube was attempted on the floor but resistance was met at the nose so it was stopped. Again, this morning she is currently denies abdominal pain. She has a significant past medical history of A. 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  She took her last dose of Eliquis yesterday morning.  Past Medical History:  Diagnosis Date   High cholesterol    Hypertension     Past Surgical History:  Procedure Laterality Date   PACEMAKER PLACEMENT      History reviewed. No pertinent family history.  Social:  reports that she has never smoked. She has never used smokeless tobacco. She reports that she does not drink alcohol and does not use drugs.  Allergies: No Known Allergies  Medications: I have reviewed the patient's current medications.  Results for orders placed or performed during the hospital encounter of 02/11/21 (from the past 48 hour(s))  Urinalysis, Routine w reflex microscopic Urine, Clean Catch     Status: Abnormal   Collection Time: 02/11/21 12:00 PM  Result Value Ref Range   Color, Urine YELLOW YELLOW   APPearance CLEAR CLEAR   Specific Gravity, Urine 1.015 1.005 - 1.030   pH 5.0 5.0 - 8.0   Glucose, UA NEGATIVE NEGATIVE mg/dL    Hgb urine dipstick NEGATIVE NEGATIVE   Bilirubin Urine NEGATIVE NEGATIVE   Ketones, ur NEGATIVE NEGATIVE mg/dL   Protein, ur 30 (A) NEGATIVE mg/dL   Nitrite NEGATIVE NEGATIVE   Leukocytes,Ua NEGATIVE NEGATIVE   RBC / HPF 6-10 0 - 5 RBC/hpf   WBC, UA 11-20 0 - 5 WBC/hpf   Bacteria, UA NONE SEEN NONE SEEN   Squamous Epithelial / LPF 0-5 0 - 5   Mucus PRESENT    Hyaline Casts, UA PRESENT     Comment: Performed at [facility], [place] [address], [place], [place] [address]  Lipase, blood     Status: None   Collection Time: 02/11/21  8:17 PM  Result Value Ref Range   Lipase 42 11 - 51 U/L    Comment: Performed at [facility], [place] [address] [place],  [address]  Comprehensive metabolic panel     Status: Abnormal   Collection Time: 02/11/21  8:17 PM  Result Value Ref Range   Sodium 136 135 - 145 mmol/L   Potassium 2.6 (LL) 3.5 - 5.1 mmol/L    Comment: CRITICAL RESULT CALLED TO, READ BACK BY AND VERIFIED WITH: RIMANDO J @2148  BY BATTLET    Chloride 102 98 - 111 mmol/L   CO2 27 22 - 32 mmol/L   Glucose, Bld 120 (H) 70 - 99 mg/dL    Comment: Glucose reference range applies only to  samples taken after fasting for at least 8 hours.   BUN 17 8 - 23 mg/dL   Creatinine, Ser 0.95 0.44 - 1.00 mg/dL   Calcium 9.1 8.9 - 10.3 mg/dL   Total Protein 7.5 6.5 - 8.1 g/dL   Albumin 4.3 3.5 - 5.0 g/dL   AST 31 15 - 41 U/L   ALT 27 0 - 44 U/L   Alkaline Phosphatase 87 38 - 126 U/L   Total Bilirubin 0.5 0.3 - 1.2 mg/dL   GFR, Estimated 60 (L) >60 mL/min    Comment: (NOTE) Calculated using the CKD-EPI Creatinine Equation (2021)    Anion gap 7 5 - 15    Comment: Performed at [facility], [place] [address], [place], [place] [address]  CBC with Differential     Status: Abnormal   Collection Time: 02/11/21  8:17 PM  Result Value Ref Range   WBC 5.8 4.0 - 10.5 K/uL   RBC 3.72 (L) 3.87 - 5.11 MIL/uL   Hemoglobin 11.6 (L) 12.0 - 15.0 g/dL    HCT 34.5 (L) 36.0 - 46.0 %   MCV 92.7 80.0 - 100.0 fL   MCH 31.2 26.0 - 34.0 pg   MCHC 33.6 30.0 - 36.0 g/dL   RDW 15.0 11.5 - 15.5 %   Platelets 159 150 - 400 K/uL   nRBC 0.0 0.0 - 0.2 %   Neutrophils Relative % 66 %   Neutro Abs 3.9 1.7 - 7.7 K/uL   Lymphocytes Relative 22 %   Lymphs Abs 1.3 0.7 - 4.0 K/uL   Monocytes Relative 9 %   Monocytes Absolute 0.5 0.1 - 1.0 K/uL   Eosinophils Relative 2 %   Eosinophils Absolute 0.1 0.0 - 0.5 K/uL   Basophils Relative 1 %   Basophils Absolute 0.0 0.0 - 0.1 K/uL   Immature Granulocytes 0 %   Abs Immature Granulocytes 0.02 0.00 - 0.07 K/uL    Comment: Performed at [facility], [place] [address] [place], [place] [address]  Magnesium     Status: None   Collection Time: 02/11/21  8:17 PM  Result Value Ref Range   Magnesium 2.2 1.7 - 2.4 mg/dL    Comment: Performed at [facility], [place] [address] [place], [place] [address]  Resp Panel by RT-PCR (Flu A&B, Covid) Nasopharyngeal Swab     Status: None   Collection Time: 02/11/21 11:50 PM   Specimen: Nasopharyngeal Swab; Nasopharyngeal(NP) swabs in vial transport medium  Result Value Ref Range   SARS Coronavirus 2 by RT PCR NEGATIVE NEGATIVE    Comment: (NOTE) SARS-CoV-2 target nucleic acids are NOT DETECTED.  The SARS-CoV-2 RNA is generally detectable in upper respiratory specimens during the acute phase of infection. The lowest concentration of SARS-CoV-2 viral copies this assay can detect is 138 copies/mL. A negative result does not preclude SARS-Cov-2 infection and should not be used as the sole basis for treatment or other patient management decisions. A negative result may occur with  improper specimen collection/handling, submission of specimen other than nasopharyngeal swab, presence of viral mutation(s) within the areas targeted by this assay, and inadequate number of viral copies(<138 copies/mL). A negative result must be combined  with clinical observations, patient history, and epidemiological information. The expected result is Negative.  Fact Sheet for Patients:  EntrepreneurPulse.com.au  Fact Sheet for Healthcare Providers:  IncredibleEmployment.be  This test is no t yet approved or cleared by the [place] and  has been authorized for  detection and/or diagnosis of SARS-CoV-2 by FDA under an Emergency Use Authorization (EUA). This EUA will remain  in effect (meaning this test can be used) for the duration of the COVID-19 declaration under Section 564(b)(1) of the Act, 21 U.S.C.section 360bbb-3(b)(1), unless the authorization is terminated  or revoked sooner.       Influenza A by PCR NEGATIVE NEGATIVE   Influenza B by PCR NEGATIVE NEGATIVE    Comment: (NOTE) The Xpert Xpress SARS-CoV-2/FLU/RSV plus assay is intended as an aid in the diagnosis of influenza from Nasopharyngeal swab specimens and should not be used as a sole basis for treatment. Nasal washings and aspirates are unacceptable for Xpert Xpress SARS-CoV-2/FLU/RSV testing.  Fact Sheet for Patients: EntrepreneurPulse.com.au  Fact Sheet for Healthcare Providers: IncredibleEmployment.be  This test is not yet approved or cleared by the [place] FDA and has been authorized for detection and/or diagnosis of SARS-CoV-2 by FDA under an Emergency Use Authorization (EUA). This EUA will remain in effect (meaning this test can be used) for the duration of the COVID-19 declaration under Section 564(b)(1) of the Act, 21 U.S.C. section 360bbb-3(b)(1), unless the authorization is terminated or revoked.  Performed at [facility], [place] [address], [place], [place] [address]     CT ABDOMEN PELVIS W CONTRAST  Result Date: 02/11/2021 CLINICAL DATA:  Evaluate for bowel obstruction. Left upper quadrant abdominal pain. EXAM: CT ABDOMEN AND PELVIS WITH  CONTRAST TECHNIQUE: Multidetector CT imaging of the abdomen and pelvis was performed using the standard protocol following bolus administration of intravenous contrast. CONTRAST:  56m OMNIPAQUE IOHEXOL 350 MG/ML SOLN COMPARISON:  CT abdomen and pelvis 12/28/2020. FINDINGS: Lower chest: No acute abnormality. Hepatobiliary: No focal liver abnormality is seen. Status post cholecystectomy. No biliary dilatation. Pancreas: Unremarkable. No pancreatic ductal dilatation or surrounding inflammatory changes. Spleen: Normal in size without focal abnormality. Adrenals/Urinary Tract: Adrenal glands are unremarkable. Kidneys are normal, without renal calculi, focal lesion, or hydronephrosis. Bladder is unremarkable. Stomach/Bowel: There is sigmoid colon diverticulosis without evidence for diverticulitis. The appendix is likely surgically absent. Small bowel and colon are nondilated. Again seen is a large hiatal hernia. The gastric body and fundus are now below the diaphragm, new finding. The gastric antrum remains within the hernia. The intrathoracic and intra-abdominal portions of the stomach are dilated with air-fluid levels. There is no pneumatosis or free air. Vascular/Lymphatic: Aortic atherosclerosis. No enlarged abdominal or pelvic lymph nodes. Reproductive: Uterus and bilateral adnexa are unremarkable. Other: No abdominal wall hernia or abnormality. There is a small fat containing left inguinal hernia. There is no ascites. Musculoskeletal: T12 inferior endplate compression fracture has increased when compared to the prior study. IMPRESSION: 1. Large hiatal hernia is again seen. In the interval, the gastric fundus has change locations and is now intraabdominal while the gastric antrum remains in the hernia. Both intrathoracic and intra-abdominal portions of the stomach are dilated with air-fluid levels. 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 Surgical consultation recommended. 2. T12 compression  fracture has increased compared to 12/28/2020. Electronically Signed   By: ARonney AstersM.D.   On: 02/11/2021 23:25    ROS - all of the below systems have been reviewed with the patient and positives are indicated with bold text General: chills, fever or night sweats Eyes: blurry vision or double vision ENT: epistaxis or sore throat Allergy/Immunology: itchy/watery eyes or nasal congestion Hematologic/Lymphatic: bleeding problems, blood clots or swollen lymph nodes Endocrine: temperature intolerance or unexpected weight changes Breast: new or changing breast  lumps or nipple discharge Resp: cough, shortness of breath, or wheezing CV: chest pain or dyspnea on exertion GI: as per HPI GU: dysuria, trouble voiding, or hematuria MSK: joint pain or joint stiffness Neuro: TIA or stroke symptoms Derm: pruritus and skin lesion changes Psych: anxiety and depression  PE Blood pressure 130/77, pulse (!) 59, temperature 97.9 F (36.6 C), temperature source Oral, resp. rate 16, height 5' 2"  (1.575 m), weight 71.8 kg, SpO2 98 %. Constitutional: NAD; conversant; no deformities Eyes: Moist conjunctiva; no lid lag; anicteric; PERRL Neck: Trachea midline; no thyromegaly Lungs: Normal respiratory effort; no tactile fremitus CV: RRR; no palpable thrills; no pitting edema GI: Abd soft, minimal fullness in the left upper quadrant, minimal tenderness with no peritonitis; no palpable hepatosplenomegaly MSK: Normal range of motion of extremities; no clubbing/cyanosis Psychiatric: Appropriate affect; alert and oriented x3 Lymphatic: No palpable cervical or axillary lymphadenopathy  Results for orders placed or performed during the hospital encounter of 02/11/21 (from the past 48 hour(s))  Urinalysis, Routine w reflex microscopic Urine, Clean Catch     Status: Abnormal   Collection Time: 02/11/21 12:00 PM  Result Value Ref Range   Color, Urine YELLOW YELLOW   APPearance CLEAR CLEAR   Specific Gravity,  Urine 1.015 1.005 - 1.030   pH 5.0 5.0 - 8.0   Glucose, UA NEGATIVE NEGATIVE mg/dL   Hgb urine dipstick NEGATIVE NEGATIVE   Bilirubin Urine NEGATIVE NEGATIVE   Ketones, ur NEGATIVE NEGATIVE mg/dL   Protein, ur 30 (A) NEGATIVE mg/dL   Nitrite NEGATIVE NEGATIVE   Leukocytes,Ua NEGATIVE NEGATIVE   RBC / HPF 6-10 0 - 5 RBC/hpf   WBC, UA 11-20 0 - 5 WBC/hpf   Bacteria, UA NONE SEEN NONE SEEN   Squamous Epithelial / LPF 0-5 0 - 5   Mucus PRESENT    Hyaline Casts, UA PRESENT     Comment: Performed at [facility], [place] [address], [place], [place] [address]  Lipase, blood     Status: None   Collection Time: 02/11/21  8:17 PM  Result Value Ref Range   Lipase 42 11 - 51 U/L    Comment: Performed at [facility], [place] [address], [place], [place] [address]  Comprehensive metabolic panel     Status: Abnormal   Collection Time: 02/11/21  8:17 PM  Result Value Ref Range   Sodium 136 135 - 145 mmol/L   Potassium 2.6 (LL) 3.5 - 5.1 mmol/L    Comment: CRITICAL RESULT CALLED TO, READ BACK BY AND VERIFIED WITH: RIMANDO J @2148  BY BATTLET    Chloride 102 98 - 111 mmol/L   CO2 27 22 - 32 mmol/L   Glucose, Bld 120 (H) 70 - 99 mg/dL    Comment: Glucose reference range applies only to samples taken after fasting for at least 8 hours.   BUN 17 8 - 23 mg/dL   Creatinine, Ser 0.95 0.44 - 1.00 mg/dL   Calcium 9.1 8.9 - 10.3 mg/dL   Total Protein 7.5 6.5 - 8.1 g/dL   Albumin 4.3 3.5 - 5.0 g/dL   AST 31 15 - 41 U/L   ALT 27 0 - 44 U/L   Alkaline Phosphatase 87 38 - 126 U/L   Total Bilirubin 0.5 0.3 - 1.2 mg/dL   GFR, Estimated 60 (L) >60 mL/min    Comment: (NOTE) Calculated using the CKD-EPI Creatinine Equation (2021)    Anion gap 7 5 - 15    Comment: Performed at  [facility], [place] [address] [place], [place] [address]  CBC with Differential     Status: Abnormal   Collection Time: 02/11/21  8:17 PM  Result Value Ref Range   WBC 5.8 4.0 -  10.5 K/uL   RBC 3.72 (L) 3.87 - 5.11 MIL/uL   Hemoglobin 11.6 (L) 12.0 - 15.0 g/dL   HCT 34.5 (L) 36.0 - 46.0 %   MCV 92.7 80.0 - 100.0 fL   MCH 31.2 26.0 - 34.0 pg   MCHC 33.6 30.0 - 36.0 g/dL   RDW 15.0 11.5 - 15.5 %   Platelets 159 150 - 400 K/uL   nRBC 0.0 0.0 - 0.2 %   Neutrophils Relative % 66 %   Neutro Abs 3.9 1.7 - 7.7 K/uL   Lymphocytes Relative 22 %   Lymphs Abs 1.3 0.7 - 4.0 K/uL   Monocytes Relative 9 %   Monocytes Absolute 0.5 0.1 - 1.0 K/uL   Eosinophils Relative 2 %   Eosinophils Absolute 0.1 0.0 - 0.5 K/uL   Basophils Relative 1 %   Basophils Absolute 0.0 0.0 - 0.1 K/uL   Immature Granulocytes 0 %   Abs Immature Granulocytes 0.02 0.00 - 0.07 K/uL    Comment: Performed at [facility], [place] [address] [place], [place] [address]  Magnesium     Status: None   Collection Time: 02/11/21  8:17 PM  Result Value Ref Range   Magnesium 2.2 1.7 - 2.4 mg/dL    Comment: Performed at [facility], [place] [address] [place],  [address]  Resp Panel by RT-PCR (Flu A&B, Covid) Nasopharyngeal Swab     Status: None   Collection Time: 02/11/21 11:50 PM   Specimen: Nasopharyngeal Swab; Nasopharyngeal(NP) swabs in vial transport medium  Result Value Ref Range   SARS Coronavirus 2 by RT PCR NEGATIVE NEGATIVE    Comment: (NOTE) SARS-CoV-2 target nucleic acids are NOT DETECTED.  The SARS-CoV-2 RNA is generally detectable in upper respiratory specimens during the acute phase of infection. The lowest concentration of SARS-CoV-2 viral copies this assay can detect is 138 copies/mL. A negative result does not preclude SARS-Cov-2 infection and should not be used as the sole basis for treatment or other patient management decisions. A negative result may occur with  improper specimen collection/handling, submission of specimen other than nasopharyngeal swab, presence of viral mutation(s) within the areas targeted by this assay, and inadequate  number of viral copies(<138 copies/mL). A negative result must be combined with clinical observations, patient history, and epidemiological information. The expected result is Negative.  Fact Sheet for Patients:  EntrepreneurPulse.com.au  Fact Sheet for Healthcare Providers:  IncredibleEmployment.be  This test is no t yet approved or cleared by the [place] FDA and  has been authorized for detection and/or diagnosis of SARS-CoV-2 by FDA under an Emergency Use Authorization (EUA). This EUA will remain  in effect (meaning this test can be used) for the duration of the COVID-19 declaration under Section 564(b)(1) of the Act, 21 U.S.C.section 360bbb-3(b)(1), unless the authorization is terminated  or revoked sooner.       Influenza A by PCR NEGATIVE NEGATIVE   Influenza B by PCR NEGATIVE NEGATIVE    Comment: (NOTE) The Xpert Xpress SARS-CoV-2/FLU/RSV plus assay is intended as an aid in the diagnosis of influenza from Nasopharyngeal swab specimens and should not be used as a sole basis for treatment. Nasal washings and aspirates are unacceptable for Xpert Xpress SARS-CoV-2/FLU/RSV testing.  Fact  Sheet for Patients: EntrepreneurPulse.com.au  Fact Sheet for Healthcare Providers: IncredibleEmployment.be  This test is not yet approved or cleared by the [place] FDA and has been authorized for detection and/or diagnosis of SARS-CoV-2 by FDA under an Emergency Use Authorization (EUA). This EUA will remain in effect (meaning this test can be used) for the duration of the COVID-19 declaration under Section 564(b)(1) of the Act, 21 U.S.C. section 360bbb-3(b)(1), unless the authorization is terminated or revoked.  Performed at [facility], [place] [address], [place], [place] [address]     CT ABDOMEN PELVIS W CONTRAST  Result Date: 02/11/2021 CLINICAL DATA:  Evaluate for bowel  obstruction. Left upper quadrant abdominal pain. EXAM: CT ABDOMEN AND PELVIS WITH CONTRAST TECHNIQUE: Multidetector CT imaging of the abdomen and pelvis was performed using the standard protocol following bolus administration of intravenous contrast. CONTRAST:  65m OMNIPAQUE IOHEXOL 350 MG/ML SOLN COMPARISON:  CT abdomen and pelvis 12/28/2020. FINDINGS: Lower chest: No acute abnormality. Hepatobiliary: No focal liver abnormality is seen. Status post cholecystectomy. No biliary dilatation. Pancreas: Unremarkable. No pancreatic ductal dilatation or surrounding inflammatory changes. Spleen: Normal in size without focal abnormality. Adrenals/Urinary Tract: Adrenal glands are unremarkable. Kidneys are normal, without renal calculi, focal lesion, or hydronephrosis. Bladder is unremarkable. Stomach/Bowel: There is sigmoid colon diverticulosis without evidence for diverticulitis. The appendix is likely surgically absent. Small bowel and colon are nondilated. Again seen is a large hiatal hernia. The gastric body and fundus are now below the diaphragm, new finding. The gastric antrum remains within the hernia. The intrathoracic and intra-abdominal portions of the stomach are dilated with air-fluid levels. There is no pneumatosis or free air. Vascular/Lymphatic: Aortic atherosclerosis. No enlarged abdominal or pelvic lymph nodes. Reproductive: Uterus and bilateral adnexa are unremarkable. Other: No abdominal wall hernia or abnormality. There is a small fat containing left inguinal hernia. There is no ascites. Musculoskeletal: T12 inferior endplate compression fracture has increased when compared to the prior study. IMPRESSION: 1. Large hiatal hernia is again seen. In the interval, the gastric fundus has change locations and is now intraabdominal while the gastric antrum remains in the hernia. Both intrathoracic and intra-abdominal portions of the stomach are dilated with air-fluid levels. Findings are worrisome for gastric  volvulus and obstruction. Surgical consultation recommended. 2. T12 compression fracture has increased compared to 12/28/2020. Electronically Signed   By: ARonney AstersM.D.   On: 02/11/2021 23:25     A/P: BShalena Ezzellis an 80y.o. female with a very large hiatal hernia containing stomach and colon with the possibility of a chronic gastric volvulus.  I have reviewed her CT scan and laboratory data.  Currently, she does not have acute abdomen so I do not believe she needs emergent surgery.  GI is going to evaluate the patient.  She may benefit from an upper endoscopy to define the anatomy.  She still may require nasogastric decompression.  For nonemergent surgical repair of this, she would need to be off of Eliquis at least another 24 hours.  Given the extent of her hiatal hernia, she would likely need at the least an open laparotomy with pexing of the stomach to the abdominal wall with a gastric tube.  I have discussed this with her.  I am not sure how well she understands the situation.  Again, she does not need emergent surgery today.  We will follow her closely.  She should remain n.p.o. for now  DNedra Hai MD [facility] Use [email or web address] to contact  on call provider

## 2021-02-12 NOTE — H&P (View-Only) (Signed)
Reason for Consult: Gastric volvulus Referring Physician: Hospital team  Danielle Blanchard is an 80 y.o. female.  HPI: Patient seen and examined in hospital computer chart reviewed and case discussed with the hospital team and agree with surgical note and patient has for 3 to 6 months had significant eating problems but is unaware of any significant weight loss although her size may have decreased from 16-14 and she has not had a previous endoscopy was not aware she had a hiatal hernia but she did have a colonoscopy years ago and is not having any pain nausea or vomiting or fever and no other complaints  Past Medical History:  Diagnosis Date   High cholesterol    Hypertension     Past Surgical History:  Procedure Laterality Date   PACEMAKER PLACEMENT      History reviewed. No pertinent family history.  Social History:  reports that she has never smoked. She has never used smokeless tobacco. She reports that she does not drink alcohol and does not use drugs.  Allergies: No Known Allergies  Medications: I have reviewed the patient's current medications.  Results for orders placed or performed during the hospital encounter of 02/11/21 (from the past 48 hour(s))  Urinalysis, Routine w reflex microscopic Urine, Clean Catch     Status: Abnormal   Collection Time: 02/11/21 12:00 PM  Result Value Ref Range   Color, Urine YELLOW YELLOW   APPearance CLEAR CLEAR   Specific Gravity, Urine 1.015 1.005 - 1.030   pH 5.0 5.0 - 8.0   Glucose, UA NEGATIVE NEGATIVE mg/dL   Hgb urine dipstick NEGATIVE NEGATIVE   Bilirubin Urine NEGATIVE NEGATIVE   Ketones, ur NEGATIVE NEGATIVE mg/dL   Protein, ur 30 (A) NEGATIVE mg/dL   Nitrite NEGATIVE NEGATIVE   Leukocytes,Ua NEGATIVE NEGATIVE   RBC / HPF 6-10 0 - 5 RBC/hpf   WBC, UA 11-20 0 - 5 WBC/hpf   Bacteria, UA NONE SEEN NONE SEEN   Squamous Epithelial / LPF 0-5 0 - 5   Mucus PRESENT    Hyaline Casts, UA PRESENT     Comment: Performed at [facility], [address] [place], [place] [address]  Lipase, blood     Status: None   Collection Time: 02/11/21  8:17 PM  Result Value Ref Range   Lipase 42 11 - 51 U/L    Comment: Performed at [facility], [address] [place], [place] [address]  Comprehensive metabolic panel     Status: Abnormal   Collection Time: 02/11/21  8:17 PM  Result Value Ref Range   Sodium 136 135 - 145 mmol/L   Potassium 2.6 (LL) 3.5 - 5.1 mmol/L    Comment: CRITICAL RESULT CALLED TO, READ BACK BY AND VERIFIED WITH: RIMANDO J @2148 BY BATTLET    Chloride 102 98 - 111 mmol/L   CO2 27 22 - 32 mmol/L   Glucose, Bld 120 (H) 70 - 99 mg/dL    Comment: Glucose reference range applies only to samples taken after fasting for at least 8 hours.   BUN 17 8 - 23 mg/dL   Creatinine, Ser 0.95 0.44 - 1.00 mg/dL   Calcium 9.1 8.9 - 10.3 mg/dL   Total Protein 7.5 6.5 - 8.1 g/dL   Albumin 4.3 3.5 - 5.0 g/dL   AST 31 15 - 41 U/L   ALT 27 0 - 44 U/L   Alkaline Phosphatase 87 38 - 126 U/L   Total Bilirubin 0.5 0.3 -   1.2 mg/dL   GFR, Estimated 60 (L) >60 mL/min    Comment: (NOTE) Calculated using the CKD-EPI Creatinine Equation (2021)    Anion gap 7 5 - 15    Comment: Performed at [facility], [address], [place], [place] [address]  CBC with Differential     Status: Abnormal   Collection Time: 02/11/21  8:17 PM  Result Value Ref Range   WBC 5.8 4.0 - 10.5 K/uL   RBC 3.72 (L) 3.87 - 5.11 MIL/uL   Hemoglobin 11.6 (L) 12.0 - 15.0 g/dL   HCT 63.1 (L) 49.7 - 02.6 %   MCV 92.7 80.0 - 100.0 fL   MCH 31.2 26.0 - 34.0 pg   MCHC 33.6 30.0 - 36.0 g/dL   RDW 37.8 58.8 - 50.2 %   Platelets 159 150 - 400 K/uL   nRBC 0.0 0.0 - 0.2 %   Neutrophils Relative % 66 %   Neutro Abs 3.9 1.7 - 7.7 K/uL   Lymphocytes Relative 22 %   Lymphs Abs 1.3 0.7 - 4.0 K/uL   Monocytes Relative 9 %   Monocytes Absolute 0.5 0.1 - 1.0 K/uL   Eosinophils Relative 2 %   Eosinophils Absolute  0.1 0.0 - 0.5 K/uL   Basophils Relative 1 %   Basophils Absolute 0.0 0.0 - 0.1 K/uL   Immature Granulocytes 0 %   Abs Immature Granulocytes 0.02 0.00 - 0.07 K/uL    Comment: Performed at [facility], [address], [place], [place] [address]  Magnesium     Status: None   Collection Time: 02/11/21  8:17 PM  Result Value Ref Range   Magnesium 2.2 1.7 - 2.4 mg/dL    Comment: Performed at [facility], [address] [place], [place] [address]  Resp Panel by RT-PCR (Flu A&B, Covid) Nasopharyngeal Swab     Status: None   Collection Time: 02/11/21 11:50 PM   Specimen: Nasopharyngeal Swab; Nasopharyngeal(NP) swabs in vial transport medium  Result Value Ref Range   SARS Coronavirus 2 by RT PCR NEGATIVE NEGATIVE    Comment: (NOTE) SARS-CoV-2 target nucleic acids are NOT DETECTED.  The SARS-CoV-2 RNA is generally detectable in upper respiratory specimens during the acute phase of infection. The lowest concentration of SARS-CoV-2 viral copies this assay can detect is 138 copies/mL. A negative result does not preclude SARS-Cov-2 infection and should not be used as the sole basis for treatment or other patient management decisions. A negative result may occur with  improper specimen collection/handling, submission of specimen other than nasopharyngeal swab, presence of viral mutation(s) within the areas targeted by this assay, and inadequate number of viral copies(<138 copies/mL). A negative result must be combined with clinical observations, patient history, and epidemiological information. The expected result is Negative.  Fact Sheet for Patients:  BloggerCourse.com  Fact Sheet for Healthcare Providers:  SeriousBroker.it  This test is no t yet approved or cleared by the [place] FDA and  has been authorized for detection and/or diagnosis of SARS-CoV-2 by FDA under an Emergency Use Authorization  (EUA). This EUA will remain  in effect (meaning this test can be used) for the duration of the COVID-19 declaration under Section 564(b)(1) of the Act, 21 U.S.C.section 360bbb-3(b)(1), unless the authorization is terminated  or revoked sooner.       Influenza A by PCR NEGATIVE NEGATIVE   Influenza B by PCR NEGATIVE NEGATIVE    Comment: (NOTE) The Xpert Xpress SARS-CoV-2/FLU/RSV plus assay is  intended as an aid in the diagnosis of influenza from Nasopharyngeal swab specimens and should not be used as a sole basis for treatment. Nasal washings and aspirates are unacceptable for Xpert Xpress SARS-CoV-2/FLU/RSV testing.  Fact Sheet for Patients: BloggerCourse.com  Fact Sheet for Healthcare Providers: SeriousBroker.it  This test is not yet approved or cleared by the [place] FDA and has been authorized for detection and/or diagnosis of SARS-CoV-2 by FDA under an Emergency Use Authorization (EUA). This EUA will remain in effect (meaning this test can be used) for the duration of the COVID-19 declaration under Section 564(b)(1) of the Act, 21 U.S.C. section 360bbb-3(b)(1), unless the authorization is terminated or revoked.  Performed at [facility], [address], [place], [place] [address]   Comprehensive metabolic panel     Status: Abnormal   Collection Time: 02/12/21 10:18 AM  Result Value Ref Range   Sodium 138 135 - 145 mmol/L   Potassium 3.5 3.5 - 5.1 mmol/L    Comment: DELTA CHECK NOTED   Chloride 103 98 - 111 mmol/L   CO2 28 22 - 32 mmol/L   Glucose, Bld 88 70 - 99 mg/dL    Comment: Glucose reference range applies only to samples taken after fasting for at least 8 hours.   BUN 13 8 - 23 mg/dL   Creatinine, Ser 5.92 0.44 - 1.00 mg/dL   Calcium 8.4 (L) 8.9 - 10.3 mg/dL   Total Protein 6.0 (L) 6.5 - 8.1 g/dL   Albumin 3.4 (L) 3.5 - 5.0 g/dL   AST 23 15 - 41 U/L   ALT 22 0 - 44 U/L   Alkaline  Phosphatase 70 38 - 126 U/L   Total Bilirubin 0.4 0.3 - 1.2 mg/dL   GFR, Estimated >92 >44 mL/min    Comment: (NOTE) Calculated using the CKD-EPI Creatinine Equation (2021)    Anion gap 7 5 - 15    Comment: Performed at [facility], [address], [place], [place] [address]  CBC with Differential/Platelet     Status: Abnormal   Collection Time: 02/12/21 10:18 AM  Result Value Ref Range   WBC 4.4 4.0 - 10.5 K/uL   RBC 3.29 (L) 3.87 - 5.11 MIL/uL   Hemoglobin 10.3 (L) 12.0 - 15.0 g/dL   HCT 81.7 (L) 71.1 - 65.7 %   MCV 93.6 80.0 - 100.0 fL   MCH 31.3 26.0 - 34.0 pg   MCHC 33.4 30.0 - 36.0 g/dL   RDW 90.3 83.3 - 38.3 %   Platelets 142 (L) 150 - 400 K/uL   nRBC 0.0 0.0 - 0.2 %   Neutrophils Relative % 64 %   Neutro Abs 2.8 1.7 - 7.7 K/uL   Lymphocytes Relative 22 %   Lymphs Abs 1.0 0.7 - 4.0 K/uL   Monocytes Relative 10 %   Monocytes Absolute 0.4 0.1 - 1.0 K/uL   Eosinophils Relative 2 %   Eosinophils Absolute 0.1 0.0 - 0.5 K/uL   Basophils Relative 1 %   Basophils Absolute 0.0 0.0 - 0.1 K/uL   Immature Granulocytes 1 %   Abs Immature Granulocytes 0.02 0.00 - 0.07 K/uL    Comment: Performed at [facility], [address], [place], [place] [address]  Magnesium     Status: None   Collection Time: 02/12/21 10:18 AM  Result Value Ref Range   Magnesium 2.0 1.7 - 2.4 mg/dL    Comment: Performed at [facility], [address] [place], [place] [address]    CT ABDOMEN PELVIS W CONTRAST  Result Date: 02/11/2021 CLINICAL DATA:  Evaluate for bowel obstruction. Left upper quadrant abdominal pain. EXAM: CT ABDOMEN AND PELVIS WITH CONTRAST TECHNIQUE: Multidetector CT imaging of the abdomen and pelvis was performed using the standard protocol following bolus administration of intravenous contrast. CONTRAST:  30mL OMNIPAQUE IOHEXOL 350 MG/ML SOLN COMPARISON:  CT abdomen and pelvis 12/28/2020. FINDINGS: Lower chest: No acute  abnormality. Hepatobiliary: No focal liver abnormality is seen. Status post cholecystectomy. No biliary dilatation. Pancreas: Unremarkable. No pancreatic ductal dilatation or surrounding inflammatory changes. Spleen: Normal in size without focal abnormality. Adrenals/Urinary Tract: Adrenal glands are unremarkable. Kidneys are normal, without renal calculi, focal lesion, or hydronephrosis. Bladder is unremarkable. Stomach/Bowel: There is sigmoid colon diverticulosis without evidence for diverticulitis. The appendix is likely surgically absent. Small bowel and colon are nondilated. Again seen is a large hiatal hernia. The gastric body and fundus are now below the diaphragm, new finding. The gastric antrum remains within the hernia. The intrathoracic and intra-abdominal portions of the stomach are dilated with air-fluid levels. There is no pneumatosis or free air. Vascular/Lymphatic: Aortic atherosclerosis. No enlarged abdominal or pelvic lymph nodes. Reproductive: Uterus and bilateral adnexa are unremarkable. Other: No abdominal wall hernia or abnormality. There is a small fat containing left inguinal hernia. There is no ascites. Musculoskeletal: T12 inferior endplate compression fracture has increased when compared to the prior study. IMPRESSION: 1. Large hiatal hernia is again seen. In the interval, the gastric fundus has change locations and is now intraabdominal while the gastric antrum remains in the hernia. Both intrathoracic and intra-abdominal portions of the stomach are dilated with air-fluid levels. 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 Surgical consultation recommended. 2. T12 compression fracture has increased compared to 12/28/2020. Electronically Signed   By: Darliss Cheney M.D.   On: 02/11/2021 23:25    ROS negative except above Blood pressure 130/77, pulse (!) 59, temperature 97.9 F (36.6 C), temperature source Oral, resp. rate 16, height 5\' 2"  (1.575 m), weight 71.8 kg, SpO2  98 %. 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 and okay to hold off for now since she is essentially asymptomatic as long as she does not eat and will need to hold heparin at 6 AM which was discussed with her nurse with further work-up and plans pending those findings  Danielle Blanchard E 02/12/2021, 12:22 PM

## 2021-02-12 NOTE — Progress Notes (Addendum)
Pt arrived to room 1505 [facility] 5 E c/o new right shoulder pain level of 7 from 0-10. Will continue to monitor.

## 2021-02-12 NOTE — Consult Note (Signed)
Reason for Consult: Gastric volvulus Referring Physician: Hospital team  Danielle Blanchard is an 80 y.o. female.  HPI: Patient seen and examined in hospital computer chart reviewed and case discussed with the hospital team and agree with surgical note and patient has for 3 to 6 months had significant eating problems but is unaware of any significant weight loss although her size may have decreased from 16-14 and she has not had a previous endoscopy was not aware she had a hiatal hernia but she did have a colonoscopy years ago and is not having any pain nausea or vomiting or fever and no other complaints  Past Medical History:  Diagnosis Date   High cholesterol    Hypertension     Past Surgical History:  Procedure Laterality Date   PACEMAKER PLACEMENT      History reviewed. No pertinent family history.  Social History:  reports that she has never smoked. She has never used smokeless tobacco. She reports that she does not drink alcohol and does not use drugs.  Allergies: No Known Allergies  Medications: I have reviewed the patient's current medications.  Results for orders placed or performed during the hospital encounter of 02/11/21 (from the past 48 hour(s))  Urinalysis, Routine w reflex microscopic Urine, Clean Catch     Status: Abnormal   Collection Time: 02/11/21 12:00 PM  Result Value Ref Range   Color, Urine YELLOW YELLOW   APPearance CLEAR CLEAR   Specific Gravity, Urine 1.015 1.005 - 1.030   pH 5.0 5.0 - 8.0   Glucose, UA NEGATIVE NEGATIVE mg/dL   Hgb urine dipstick NEGATIVE NEGATIVE   Bilirubin Urine NEGATIVE NEGATIVE   Ketones, ur NEGATIVE NEGATIVE mg/dL   Protein, ur 30 (A) NEGATIVE mg/dL   Nitrite NEGATIVE NEGATIVE   Leukocytes,Ua NEGATIVE NEGATIVE   RBC / HPF 6-10 0 - 5 RBC/hpf   WBC, UA 11-20 0 - 5 WBC/hpf   Bacteria, UA NONE SEEN NONE SEEN   Squamous Epithelial / LPF 0-5 0 - 5   Mucus PRESENT    Hyaline Casts, UA PRESENT     Comment: Performed at [facility], [address] [place], [place] [address]  Lipase, blood     Status: None   Collection Time: 02/11/21  8:17 PM  Result Value Ref Range   Lipase 42 11 - 51 U/L    Comment: Performed at [facility], [address], [place], [place] [address]  Comprehensive metabolic panel     Status: Abnormal   Collection Time: 02/11/21  8:17 PM  Result Value Ref Range   Sodium 136 135 - 145 mmol/L   Potassium 2.6 (LL) 3.5 - 5.1 mmol/L    Comment: CRITICAL RESULT CALLED TO, READ BACK BY AND VERIFIED WITH: RIMANDO J @2148  BY BATTLET    Chloride 102 98 - 111 mmol/L   CO2 27 22 - 32 mmol/L   Glucose, Bld 120 (H) 70 - 99 mg/dL    Comment: Glucose reference range applies only to samples taken after fasting for at least 8 hours.   BUN 17 8 - 23 mg/dL   Creatinine, Ser 4.49 0.44 - 1.00 mg/dL   Calcium 9.1 8.9 - 75.3 mg/dL   Total Protein 7.5 6.5 - 8.1 g/dL   Albumin 4.3 3.5 - 5.0 g/dL   AST 31 15 - 41 U/L   ALT 27 0 - 44 U/L   Alkaline Phosphatase 87 38 - 126 U/L   Total Bilirubin 0.5 0.3 -  1.2 mg/dL   GFR, Estimated 60 (L) >60 mL/min    Comment: (NOTE) Calculated using the CKD-EPI Creatinine Equation (2021)    Anion gap 7 5 - 15    Comment: Performed at [facility], [address], [place], [place] [address]  CBC with Differential     Status: Abnormal   Collection Time: 02/11/21  8:17 PM  Result Value Ref Range   WBC 5.8 4.0 - 10.5 K/uL   RBC 3.72 (L) 3.87 - 5.11 MIL/uL   Hemoglobin 11.6 (L) 12.0 - 15.0 g/dL   HCT 63.1 (L) 49.7 - 02.6 %   MCV 92.7 80.0 - 100.0 fL   MCH 31.2 26.0 - 34.0 pg   MCHC 33.6 30.0 - 36.0 g/dL   RDW 37.8 58.8 - 50.2 %   Platelets 159 150 - 400 K/uL   nRBC 0.0 0.0 - 0.2 %   Neutrophils Relative % 66 %   Neutro Abs 3.9 1.7 - 7.7 K/uL   Lymphocytes Relative 22 %   Lymphs Abs 1.3 0.7 - 4.0 K/uL   Monocytes Relative 9 %   Monocytes Absolute 0.5 0.1 - 1.0 K/uL   Eosinophils Relative 2 %   Eosinophils Absolute  0.1 0.0 - 0.5 K/uL   Basophils Relative 1 %   Basophils Absolute 0.0 0.0 - 0.1 K/uL   Immature Granulocytes 0 %   Abs Immature Granulocytes 0.02 0.00 - 0.07 K/uL    Comment: Performed at [facility], [address], [place], [place] [address]  Magnesium     Status: None   Collection Time: 02/11/21  8:17 PM  Result Value Ref Range   Magnesium 2.2 1.7 - 2.4 mg/dL    Comment: Performed at [facility], [address] [place], [place] [address]  Resp Panel by RT-PCR (Flu A&B, Covid) Nasopharyngeal Swab     Status: None   Collection Time: 02/11/21 11:50 PM   Specimen: Nasopharyngeal Swab; Nasopharyngeal(NP) swabs in vial transport medium  Result Value Ref Range   SARS Coronavirus 2 by RT PCR NEGATIVE NEGATIVE    Comment: (NOTE) SARS-CoV-2 target nucleic acids are NOT DETECTED.  The SARS-CoV-2 RNA is generally detectable in upper respiratory specimens during the acute phase of infection. The lowest concentration of SARS-CoV-2 viral copies this assay can detect is 138 copies/mL. A negative result does not preclude SARS-Cov-2 infection and should not be used as the sole basis for treatment or other patient management decisions. A negative result may occur with  improper specimen collection/handling, submission of specimen other than nasopharyngeal swab, presence of viral mutation(s) within the areas targeted by this assay, and inadequate number of viral copies(<138 copies/mL). A negative result must be combined with clinical observations, patient history, and epidemiological information. The expected result is Negative.  Fact Sheet for Patients:  BloggerCourse.com  Fact Sheet for Healthcare Providers:  SeriousBroker.it  This test is no t yet approved or cleared by the [place] FDA and  has been authorized for detection and/or diagnosis of SARS-CoV-2 by FDA under an Emergency Use Authorization  (EUA). This EUA will remain  in effect (meaning this test can be used) for the duration of the COVID-19 declaration under Section 564(b)(1) of the Act, 21 U.S.C.section 360bbb-3(b)(1), unless the authorization is terminated  or revoked sooner.       Influenza A by PCR NEGATIVE NEGATIVE   Influenza B by PCR NEGATIVE NEGATIVE    Comment: (NOTE) The Xpert Xpress SARS-CoV-2/FLU/RSV plus assay is  intended as an aid in the diagnosis of influenza from Nasopharyngeal swab specimens and should not be used as a sole basis for treatment. Nasal washings and aspirates are unacceptable for Xpert Xpress SARS-CoV-2/FLU/RSV testing.  Fact Sheet for Patients: BloggerCourse.com  Fact Sheet for Healthcare Providers: SeriousBroker.it  This test is not yet approved or cleared by the [place] FDA and has been authorized for detection and/or diagnosis of SARS-CoV-2 by FDA under an Emergency Use Authorization (EUA). This EUA will remain in effect (meaning this test can be used) for the duration of the COVID-19 declaration under Section 564(b)(1) of the Act, 21 U.S.C. section 360bbb-3(b)(1), unless the authorization is terminated or revoked.  Performed at [facility], [address], [place], [place] [address]   Comprehensive metabolic panel     Status: Abnormal   Collection Time: 02/12/21 10:18 AM  Result Value Ref Range   Sodium 138 135 - 145 mmol/L   Potassium 3.5 3.5 - 5.1 mmol/L    Comment: DELTA CHECK NOTED   Chloride 103 98 - 111 mmol/L   CO2 28 22 - 32 mmol/L   Glucose, Bld 88 70 - 99 mg/dL    Comment: Glucose reference range applies only to samples taken after fasting for at least 8 hours.   BUN 13 8 - 23 mg/dL   Creatinine, Ser 5.92 0.44 - 1.00 mg/dL   Calcium 8.4 (L) 8.9 - 10.3 mg/dL   Total Protein 6.0 (L) 6.5 - 8.1 g/dL   Albumin 3.4 (L) 3.5 - 5.0 g/dL   AST 23 15 - 41 U/L   ALT 22 0 - 44 U/L   Alkaline  Phosphatase 70 38 - 126 U/L   Total Bilirubin 0.4 0.3 - 1.2 mg/dL   GFR, Estimated >92 >44 mL/min    Comment: (NOTE) Calculated using the CKD-EPI Creatinine Equation (2021)    Anion gap 7 5 - 15    Comment: Performed at [facility], [address], [place], [place] [address]  CBC with Differential/Platelet     Status: Abnormal   Collection Time: 02/12/21 10:18 AM  Result Value Ref Range   WBC 4.4 4.0 - 10.5 K/uL   RBC 3.29 (L) 3.87 - 5.11 MIL/uL   Hemoglobin 10.3 (L) 12.0 - 15.0 g/dL   HCT 81.7 (L) 71.1 - 65.7 %   MCV 93.6 80.0 - 100.0 fL   MCH 31.3 26.0 - 34.0 pg   MCHC 33.4 30.0 - 36.0 g/dL   RDW 90.3 83.3 - 38.3 %   Platelets 142 (L) 150 - 400 K/uL   nRBC 0.0 0.0 - 0.2 %   Neutrophils Relative % 64 %   Neutro Abs 2.8 1.7 - 7.7 K/uL   Lymphocytes Relative 22 %   Lymphs Abs 1.0 0.7 - 4.0 K/uL   Monocytes Relative 10 %   Monocytes Absolute 0.4 0.1 - 1.0 K/uL   Eosinophils Relative 2 %   Eosinophils Absolute 0.1 0.0 - 0.5 K/uL   Basophils Relative 1 %   Basophils Absolute 0.0 0.0 - 0.1 K/uL   Immature Granulocytes 1 %   Abs Immature Granulocytes 0.02 0.00 - 0.07 K/uL    Comment: Performed at [facility], [address], [place], [place] [address]  Magnesium     Status: None   Collection Time: 02/12/21 10:18 AM  Result Value Ref Range   Magnesium 2.0 1.7 - 2.4 mg/dL    Comment: Performed at [facility], [address] [place], [place] [address]    CT ABDOMEN PELVIS W CONTRAST  Result Date: 02/11/2021 CLINICAL DATA:  Evaluate for bowel obstruction. Left upper quadrant abdominal pain. EXAM: CT ABDOMEN AND PELVIS WITH CONTRAST TECHNIQUE: Multidetector CT imaging of the abdomen and pelvis was performed using the standard protocol following bolus administration of intravenous contrast. CONTRAST:  30mL OMNIPAQUE IOHEXOL 350 MG/ML SOLN COMPARISON:  CT abdomen and pelvis 12/28/2020. FINDINGS: Lower chest: No acute  abnormality. Hepatobiliary: No focal liver abnormality is seen. Status post cholecystectomy. No biliary dilatation. Pancreas: Unremarkable. No pancreatic ductal dilatation or surrounding inflammatory changes. Spleen: Normal in size without focal abnormality. Adrenals/Urinary Tract: Adrenal glands are unremarkable. Kidneys are normal, without renal calculi, focal lesion, or hydronephrosis. Bladder is unremarkable. Stomach/Bowel: There is sigmoid colon diverticulosis without evidence for diverticulitis. The appendix is likely surgically absent. Small bowel and colon are nondilated. Again seen is a large hiatal hernia. The gastric body and fundus are now below the diaphragm, new finding. The gastric antrum remains within the hernia. The intrathoracic and intra-abdominal portions of the stomach are dilated with air-fluid levels. There is no pneumatosis or free air. Vascular/Lymphatic: Aortic atherosclerosis. No enlarged abdominal or pelvic lymph nodes. Reproductive: Uterus and bilateral adnexa are unremarkable. Other: No abdominal wall hernia or abnormality. There is a small fat containing left inguinal hernia. There is no ascites. Musculoskeletal: T12 inferior endplate compression fracture has increased when compared to the prior study. IMPRESSION: 1. Large hiatal hernia is again seen. In the interval, the gastric fundus has change locations and is now intraabdominal while the gastric antrum remains in the hernia. Both intrathoracic and intra-abdominal portions of the stomach are dilated with air-fluid levels. 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 Surgical consultation recommended. 2. T12 compression fracture has increased compared to 12/28/2020. Electronically Signed   By: Darliss Cheney M.D.   On: 02/11/2021 23:25    ROS negative except above Blood pressure 130/77, pulse (!) 59, temperature 97.9 F (36.6 C), temperature source Oral, resp. rate 16, height 5\' 2"  (1.575 m), weight 71.8 kg, SpO2  98 %. 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 and okay to hold off for now since she is essentially asymptomatic as long as she does not eat and will need to hold heparin at 6 AM which was discussed with her nurse with further work-up and plans pending those findings  Janann Boeve E 02/12/2021, 12:22 PM

## 2021-02-12 NOTE — Progress Notes (Addendum)
PROGRESS NOTE    Danielle Blanchard  TRR:[id number] DOB: Nov 13, 1940 DOA: 02/11/2021 PCP: Pcp, No    Chief Complaint  Patient presents with   Abdominal Pain    Brief Narrative:  Patient 80 year old female history of A. fib on chronic anticoagulation with Eliquis, SSS status post PPM, chronic diastolic CHF, hypertension, hyperlipidemia, GERD presenting with abdominal pain.  COVID-19 PCR, influenza PCR negative.  CT abdomen and pelvis with large hiatal hernia with findings concerning for gastric volvulus and obstruction.  Patient placed on bowel rest.  General surgery consulted.  GI consulted and pending.   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  No nausea or vomiting.  CT concerning for large hiatal hernia with findings concerning for gastric volvulus and obstruction. -NG tube placement recommended however resistance noted in NG tube not placed. -Patient with no further nausea or vomiting. -Eliquis on hold and patient placed on IV heparin for bridging. -Patient seen in consultation by general surgery and is felt that patient with no current acute abdomen and as such emergent surgery not needed at this time and recommending GI evaluation for possible upper endoscopy to define anatomy. -GI consultation pending. -Per general surgery.  2.  Atrial fibrillation -Rate currently controlled. -Patient currently n.p.o. -Eliquis on hold and patient started on heparin for bridging.  3.  SSS status post PPM   4.  Chronic diastolic CHF -Currently euvolemic. -Monitor volume status with gentle hydration.  5.  Hypertension -Stable. -Placed on IV hydralazine as needed.  6.  T12 compression fracture -Noted on CT and increased  compared to 12/28/2020. -Patient currently asymptomatic and neurologically intact. -Symptomatic treatment. -Outpatient follow-up.  6.  Hypokalemia -Potassium at 3.5 -KCl 10 mEq IV every hour x4 runs. -Repeat labs in the morning.   DVT prophylaxis: Heparin Code Status: Full Family Communication: Updated patient.  Updated son on the telephone. Disposition:   Status is: Inpatient  Remains inpatient appropriate because: Severity of illness       Consultants:  General surgery: Dr. Magnus Ivan 02/12/2021 GI pending  Procedures:  CT abdomen and pelvis 02/11/2021    Antimicrobials:  None   Subjective: Patient laying in bed.  Denies any chest pain.  No shortness of breath.  No nausea or vomiting.  Denies any abdominal pain at this time however does complain of having some epigastric and left upper quadrant and mid abdominal pain prior to admission.  Objective: Vitals:   02/12/21 0100 02/12/21 0129 02/12/21 0247 02/12/21 0500  BP: (!) 145/65 (!) 151/76 119/61 130/77  Pulse: (!) 58 61 60 (!) 59  Resp: 17 18 16 16   Temp:  97.9 F (36.6 C) 98 F (36.7 C) 97.9 F (36.6 C)  TempSrc:  Oral Oral Oral  SpO2: 97% 98% 100% 98%  Weight:  71.8 kg    Height:  5\' 2"  (1.575 m)      Intake/Output Summary (Last 24 hours) at 02/12/2021 1032 Last data filed at 02/12/2021 0314 Gross per 24 hour  Intake 363.06 ml  Output --  Net 363.06 ml   Filed Weights   02/12/21 0129  Weight: 71.8 kg    Examination:  General exam: Appears calm and comfortable  Respiratory system: Clear to auscultation bilaterally with no wheezes,  no crackles, no rhonchi.Marland Kitchen Respiratory effort normal. Cardiovascular system: S1 & S2 heard, RRR. No JVD, murmurs, rubs, gallops or clicks. No pedal edema. Gastrointestinal system: Abdomen is nondistended, soft and some tenderness to palpation in the mid abdominal, epigastric and left upper quadrant region.  Positive bowel sounds.  No rebound.  No guarding.  Central  nervous system: Alert and oriented. No focal neurological deficits. Extremities: Symmetric 5 x 5 power. Skin: No rashes, lesions or ulcers Psychiatry: Judgement and insight appear normal. Mood & affect appropriate.     Data Reviewed: I have personally reviewed following labs and imaging studies  CBC: Recent Labs  Lab 02/11/21 2017  WBC 5.8  NEUTROABS 3.9  HGB 11.6*  HCT 34.5*  MCV 92.7  PLT 159    Basic Metabolic Panel: Recent Labs  Lab 02/11/21 2017  NA 136  K 2.6*  CL 102  CO2 27  GLUCOSE 120*  BUN 17  CREATININE 0.95  CALCIUM 9.1  MG 2.2    GFR: Estimated Creatinine Clearance: 42.4 mL/min (by C-G formula based on SCr of 0.95 mg/dL).  Liver Function Tests: Recent Labs  Lab 02/11/21 2017  AST 31  ALT 27  ALKPHOS 87  BILITOT 0.5  PROT 7.5  ALBUMIN 4.3    CBG: No results for input(s): GLUCAP in the last 168 hours.   Recent Results (from the past 240 hour(s))  Resp Panel by RT-PCR (Flu A&B, Covid) Nasopharyngeal Swab     Status: None   Collection Time: 02/11/21 11:50 PM   Specimen: Nasopharyngeal Swab; Nasopharyngeal(NP) swabs in vial transport medium  Result Value Ref Range Status   SARS Coronavirus 2 by RT PCR NEGATIVE NEGATIVE Final    Comment: (NOTE) SARS-CoV-2 target nucleic acids are NOT DETECTED.  The SARS-CoV-2 RNA is generally detectable in upper respiratory specimens during the acute phase of infection. The lowest concentration of SARS-CoV-2 viral copies this assay can detect is 138 copies/mL. A negative result does not preclude SARS-Cov-2 infection and should not be used as the sole basis for treatment or other patient management decisions. A negative result may occur with  improper specimen collection/handling, submission of specimen other than nasopharyngeal swab, presence of viral mutation(s) within the areas targeted by this assay, and inadequate number of viral copies(<138 copies/mL). A negative result must be combined  with clinical observations, patient history, and epidemiological information. The expected result is Negative.  Fact Sheet for Patients:  BloggerCourse.com  Fact Sheet for Healthcare Providers:  SeriousBroker.it  This test is no t yet approved or cleared by the [place] FDA and  has been authorized for detection and/or diagnosis of SARS-CoV-2 by FDA under an Emergency Use Authorization (EUA). This EUA will remain  in effect (meaning this test can be used) for the duration of the COVID-19 declaration under Section 564(b)(1) of the Act, 21 U.S.C.section 360bbb-3(b)(1), unless the authorization is terminated  or revoked sooner.       Influenza A by PCR NEGATIVE NEGATIVE Final   Influenza B by PCR NEGATIVE NEGATIVE Final    Comment: (NOTE) The Xpert Xpress SARS-CoV-2/FLU/RSV plus assay is intended as an aid in the diagnosis of influenza from Nasopharyngeal swab specimens and should not be used as a sole basis for treatment. Nasal washings and aspirates are unacceptable for Xpert Xpress SARS-CoV-2/FLU/RSV testing.  Fact Sheet for Patients: BloggerCourse.com  Fact Sheet for Healthcare Providers: SeriousBroker.it  This test is not yet approved or cleared by the [place] FDA and has been authorized for detection  and/or diagnosis of SARS-CoV-2 by FDA under an Emergency Use Authorization (EUA). This EUA will remain in effect (meaning this test can be used) for the duration of the COVID-19 declaration under Section 564(b)(1) of the Act, 21 U.S.C. section 360bbb-3(b)(1), unless the authorization is terminated or revoked.  Performed at [facility], [place] [address], [place], [place] [address]          Radiology Studies: CT ABDOMEN PELVIS W CONTRAST  Result Date: 02/11/2021 CLINICAL DATA:  Evaluate for bowel obstruction. Left upper quadrant abdominal  pain. EXAM: CT ABDOMEN AND PELVIS WITH CONTRAST TECHNIQUE: Multidetector CT imaging of the abdomen and pelvis was performed using the standard protocol following bolus administration of intravenous contrast. CONTRAST:  58mL OMNIPAQUE IOHEXOL 350 MG/ML SOLN COMPARISON:  CT abdomen and pelvis 12/28/2020. FINDINGS: Lower chest: No acute abnormality. Hepatobiliary: No focal liver abnormality is seen. Status post cholecystectomy. No biliary dilatation. Pancreas: Unremarkable. No pancreatic ductal dilatation or surrounding inflammatory changes. Spleen: Normal in size without focal abnormality. Adrenals/Urinary Tract: Adrenal glands are unremarkable. Kidneys are normal, without renal calculi, focal lesion, or hydronephrosis. Bladder is unremarkable. Stomach/Bowel: There is sigmoid colon diverticulosis without evidence for diverticulitis. The appendix is likely surgically absent. Small bowel and colon are nondilated. Again seen is a large hiatal hernia. The gastric body and fundus are now below the diaphragm, new finding. The gastric antrum remains within the hernia. The intrathoracic and intra-abdominal portions of the stomach are dilated with air-fluid levels. There is no pneumatosis or free air. Vascular/Lymphatic: Aortic atherosclerosis. No enlarged abdominal or pelvic lymph nodes. Reproductive: Uterus and bilateral adnexa are unremarkable. Other: No abdominal wall hernia or abnormality. There is a small fat containing left inguinal hernia. There is no ascites. Musculoskeletal: T12 inferior endplate compression fracture has increased when compared to the prior study. IMPRESSION: 1. Large hiatal hernia is again seen. In the interval, the gastric fundus has change locations and is now intraabdominal while the gastric antrum remains in the hernia. Both intrathoracic and intra-abdominal portions of the stomach are dilated with air-fluid levels. 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 Surgical  consultation recommended. 2. T12 compression fracture has increased compared to 12/28/2020. Electronically Signed   By: Ronney Asters M.D.   On: 02/11/2021 23:25        Scheduled Meds:  heparin  3,500 Units Intravenous Once   pantoprazole (PROTONIX) IV  40 mg Intravenous Q24H   Continuous Infusions:  sodium chloride Stopped (02/12/21 0214)   sodium chloride 75 mL/hr at 02/12/21 0910   heparin       LOS: 0 days    Time spent: 35 minutes  No charge.    Irine Seal, MD [facility]   To contact the attending provider between 7A-7P or the covering provider during after hours 7P-7A, please log into the web site [email or web address] and access using universal [place] password for that web site. If you do not have the password, please call the hospital operator.  02/12/2021, 10:32 AM

## 2021-02-12 NOTE — Progress Notes (Signed)
ANTICOAGULATION CONSULT NOTE - Initial Consult  Pharmacy Consult for Heparin Indication: atrial fibrillation  No Known Allergies  Patient Measurements: Height: 5\' 2"  (157.5 cm) Weight: 71.8 kg (158 lb 4.6 oz) IBW/kg (Calculated) : 50.1 Heparin Dosing Weight: 65 kg  Vital Signs: Temp: 97.9 F (36.6 C) (11/13 0500) Temp Source: Oral (11/13 0500) BP: 130/77 (11/13 0500) Pulse Rate: 59 (11/13 0500)  Labs: Recent Labs    02/11/21 2017  HGB 11.6*  HCT 34.5*  PLT 159  CREATININE 0.95    Estimated Creatinine Clearance: 42.4 mL/min (by C-G formula based on SCr of 0.95 mg/dL).   Medical History: Past Medical History:  Diagnosis Date   High cholesterol    Hypertension     Medications:  Infusions:   sodium chloride Stopped (02/12/21 0214)   sodium chloride 75 mL/hr at 02/12/21 0910    Assessment: 80 yoF presented on 11/12 PM with abdominal pain.  PMH significant for Afib on chronic apixaban anticoagulation.  Pharmacy is consulted to dose heparin for Afib while NPO.   CT shows large hiatal hernia containing stomach and colon and a question of a gastric volvulus.  No indication for emergent surgery, but surgery is following for possible procedure.    Last dose of apixaban reportedly taken 11/12 AM.   Baseline HL, aPTT pending.  Expect baseline HL to be artificially elevated d/t recent DOAC use.  Plan to use aPTT for dosing titration until HL/aPTT correlating when DOAC is cleared in several days.   CBC: 11.6, Plt 159 SCr 0.95  Goal of Therapy:  Heparin level 0.3-0.7 units/ml aPTT 66-102 seconds Monitor platelets by anticoagulation protocol: Yes   Plan:  Baseline labs ordered.  Give heparin 3500 units bolus IV x 1 Start heparin IV infusion at 950 units/hr APTT 8 hours after starting Daily heparin level and CBC Continue to monitor H&H and platelets   13/12 PharmD, BCPS Clinical Pharmacist [facility] main pharmacy [phone] 02/12/2021 9:18 AM

## 2021-02-12 NOTE — ED Notes (Signed)
Labeled urine specimen and culture sent to lab. ENMiles 

## 2021-02-12 NOTE — H&P (Addendum)
History and Physical    Danielle Blanchard ASN:[id number] DOB: Oct 28, 1940 DOA: 02/11/2021  PCP: Pcp, No Patient coming from: Home  Chief Complaint: Abdominal pain  HPI: Danielle Blanchard is a 80 y.o. female with medical history significant of A. fib on Eliquis, sick sinus syndrome status post pacemaker, chronic diastolic CHF, hypertension, hyperlipidemia, GERD presented to the ED for evaluation of abdominal pain.  Vital signs stable on arrival.  Labs showing WBC 5.8, hemoglobin 11.6 (stable), platelet count 159k.  Sodium 136, potassium 2.6, chloride 102, bicarb 27, BUN 17, creatinine 0.9, glucose 120.  Lipase and LFTs normal.  UA pending.  COVID and influenza PCR pending.  CT showing large hiatal hernia and findings concerning for gastric volvulus and obstruction. ED physician discussed the case with Dr. Magnus Ivan with general surgery who recommended NG tube placement and GI consultation.  ED physician sent a message to Dr. Ewing Schlein on-call for GI requesting consultation in the morning.  Patient was given fentanyl and potassium supplementation.  Patient reports left upper quadrant abdominal pain for several days.  Unclear how severe the pain is.  She is eating food and having regular bowel movements.  No nausea or vomiting.  No diarrhea.  Denies history of prior abdominal surgeries.  Denies cough, shortness of breath, or chest pain.  No additional history could be obtained from her.  Review of Systems:  All systems reviewed and apart from history of presenting illness, are negative.  Past Medical History:  Diagnosis Date   High cholesterol    Hypertension     Past Surgical History:  Procedure Laterality Date   PACEMAKER PLACEMENT       reports that she has never smoked. She has never used smokeless tobacco. She reports that she does not drink alcohol and does not use drugs.  No Known Allergies  History reviewed. No pertinent family history.  Prior to Admission medications   Medication Sig Start Date  End Date Taking? Authorizing Provider  apixaban (ELIQUIS) 2.5 MG TABS tablet Take by mouth 2 (two) times daily.    [provider]  carvedilol (COREG) 12.5 MG tablet Take 12.5 mg by mouth 2 (two) times daily with a meal.    [provider]  Diclofenac Sodium (PENNSAID) 2 % SOLN Apply 2 Pump topically 2 (two) times daily. 12/27/20   Wieters, Hallie C, PA-C  losartan (COZAAR) 50 MG tablet Take 50 mg by mouth daily.    [provider]  methocarbamol (ROBAXIN) 500 MG tablet Take 1 tablet (500 mg total) by mouth at bedtime as needed for muscle spasms. 12/27/20   Wieters, Hallie C, PA-C  omeprazole (PRILOSEC) 40 MG capsule Take 1 capsule (40 mg total) by mouth daily. Take 1 tablet in the morning 30 minutes before you eat.  Take daily. 12/06/19   Arby Barrette, MD  Polyethyl Glycol-Propyl Glycol (SYSTANE) 0.4-0.3 % GEL ophthalmic gel Place 1 application into both eyes 3 (three) times daily as needed. 02/15/20   Cathie Hoops, Amy V, PA-C  rosuvastatin (CRESTOR) 40 MG tablet Take 40 mg by mouth daily.    [provider]    Physical Exam: Vitals:   02/11/21 2315 02/11/21 2330 02/12/21 0100 02/12/21 0129  BP:  126/62 (!) 145/65 (!) 151/76  Pulse: 60 60 (!) 58 61  Resp: 15 15 17 18   Temp:    97.9 F (36.6 C)  TempSrc:    Oral  SpO2: 100% 99% 97% 98%    Physical Exam Constitutional:      General:  She is not in acute distress. HENT:     Head: Normocephalic and atraumatic.  Eyes:     Extraocular Movements: Extraocular movements intact.     Conjunctiva/sclera: Conjunctivae normal.  Cardiovascular:     Rate and Rhythm: Normal rate and regular rhythm.     Pulses: Normal pulses.  Pulmonary:     Effort: Pulmonary effort is normal. No respiratory distress.     Breath sounds: Normal breath sounds. No wheezing or rales.  Abdominal:     General: Bowel sounds are normal. There is no distension.     Palpations: Abdomen is soft.     Tenderness: There is no abdominal tenderness.  There is no guarding or rebound.  Musculoskeletal:        General: No swelling or tenderness.     Cervical back: Normal range of motion and neck supple.  Skin:    General: Skin is warm and dry.  Neurological:     General: No focal deficit present.     Mental Status: She is alert and oriented to person, place, and time.     Labs on Admission: I have personally reviewed following labs and imaging studies  CBC: Recent Labs  Lab 02/11/21 2017  WBC 5.8  NEUTROABS 3.9  HGB 11.6*  HCT 34.5*  MCV 92.7  PLT [id number]   Basic Metabolic Panel: Recent Labs  Lab 02/11/21 2017  NA 136  K 2.6*  CL 102  CO2 27  GLUCOSE 120*  BUN 17  CREATININE 0.95  CALCIUM 9.1   GFR: CrCl cannot be calculated (Unknown ideal weight.). Liver Function Tests: Recent Labs  Lab 02/11/21 2017  AST 31  ALT 27  ALKPHOS 87  BILITOT 0.5  PROT 7.5  ALBUMIN 4.3   Recent Labs  Lab 02/11/21 2017  LIPASE 42   No results for input(s): AMMONIA in the last 168 hours. Coagulation Profile: No results for input(s): INR, PROTIME in the last 168 hours. Cardiac Enzymes: No results for input(s): CKTOTAL, CKMB, CKMBINDEX, TROPONINI in the last 168 hours. BNP (last 3 results) No results for input(s): PROBNP in the last 8760 hours. HbA1C: No results for input(s): HGBA1C in the last 72 hours. CBG: No results for input(s): GLUCAP in the last 168 hours. Lipid Profile: No results for input(s): CHOL, HDL, LDLCALC, TRIG, CHOLHDL, LDLDIRECT in the last 72 hours. Thyroid Function Tests: No results for input(s): TSH, T4TOTAL, FREET4, T3FREE, THYROIDAB in the last 72 hours. Anemia Panel: No results for input(s): VITAMINB12, FOLATE, FERRITIN, TIBC, IRON, RETICCTPCT in the last 72 hours. Urine analysis:    Component Value Date/Time   COLORURINE YELLOW 02/11/2021 1200   APPEARANCEUR CLEAR 02/11/2021 1200   LABSPEC 1.015 02/11/2021 1200   PHURINE 5.0 02/11/2021 1200   GLUCOSEU NEGATIVE 02/11/2021 1200   HGBUR  NEGATIVE 02/11/2021 1200   BILIRUBINUR NEGATIVE 02/11/2021 1200   KETONESUR NEGATIVE 02/11/2021 1200   PROTEINUR 30 (A) 02/11/2021 1200   NITRITE NEGATIVE 02/11/2021 1200   LEUKOCYTESUR NEGATIVE 02/11/2021 1200    Radiological Exams on Admission: CT ABDOMEN PELVIS W CONTRAST  Result Date: 02/11/2021 CLINICAL DATA:  Evaluate for bowel obstruction. Left upper quadrant abdominal pain. EXAM: CT ABDOMEN AND PELVIS WITH CONTRAST TECHNIQUE: Multidetector CT imaging of the abdomen and pelvis was performed using the standard protocol following bolus administration of intravenous contrast. CONTRAST:  26mL OMNIPAQUE IOHEXOL 350 MG/ML SOLN COMPARISON:  CT abdomen and pelvis 12/28/2020. FINDINGS: Lower chest: No acute abnormality. Hepatobiliary: No focal liver abnormality is seen. Status post  cholecystectomy. No biliary dilatation. Pancreas: Unremarkable. No pancreatic ductal dilatation or surrounding inflammatory changes. Spleen: Normal in size without focal abnormality. Adrenals/Urinary Tract: Adrenal glands are unremarkable. Kidneys are normal, without renal calculi, focal lesion, or hydronephrosis. Bladder is unremarkable. Stomach/Bowel: There is sigmoid colon diverticulosis without evidence for diverticulitis. The appendix is likely surgically absent. Small bowel and colon are nondilated. Again seen is a large hiatal hernia. The gastric body and fundus are now below the diaphragm, new finding. The gastric antrum remains within the hernia. The intrathoracic and intra-abdominal portions of the stomach are dilated with air-fluid levels. There is no pneumatosis or free air. Vascular/Lymphatic: Aortic atherosclerosis. No enlarged abdominal or pelvic lymph nodes. Reproductive: Uterus and bilateral adnexa are unremarkable. Other: No abdominal wall hernia or abnormality. There is a small fat containing left inguinal hernia. There is no ascites. Musculoskeletal: T12 inferior endplate compression fracture has increased  when compared to the prior study. IMPRESSION: 1. Large hiatal hernia is again seen. In the interval, the gastric fundus has change locations and is now intraabdominal while the gastric antrum remains in the hernia. Both intrathoracic and intra-abdominal portions of the stomach are dilated with air-fluid levels. 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 Surgical consultation recommended. 2. T12 compression fracture has increased compared to 12/28/2020. Electronically Signed   By: Ronney Asters M.D.   On: 02/11/2021 23:25    EKG: Independently reviewed.  Interpretation limited secondary to paced rhythm.  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  No nausea or vomiting. CT showing large hiatal hernia and findings concerning for gastric volvulus and obstruction. General surgery recommended NG tube placement and GI consultation.  GI consulted by ED physician. -Bowel rest, gentle IV fluid hydration.  Place NG tube.  Pain management.  Serial abdominal exams.  Hold home Eliquis until patient is evaluated by GI and general surgery.  A. Fib -Hold Eliquis at this time.  Sick sinus syndrome -Status post PPM  Chronic diastolic CHF No signs of volume overload. -Monitor volume status closely  Hypertension Stable. -IV hydralazine PRN  T12 compression fracture Seen on CT and increased compared to 12/28/2020.  Patient is not endorsing back pain.  Neurologically intact. -Symptomatic management  Pharmacy med rec pending.  Addendum: Hypokalemia -Monitor potassium and magnesium levels, replenish as needed.  DVT prophylaxis: SCDs Code Status: Full code-discussed with the patient. Family Communication: No family  available at this time. Disposition Plan: Status is: Inpatient  Remains inpatient appropriate because: Needs IV fluids, IV analgesics, bowel rest, and NG tube for decompression.  Level of care:  Level of care: Telemetry  The medical decision making on this patient was of high complexity and the patient is at high risk for clinical deterioration, therefore this is a level 3 visit.  Shela Leff MD [facility]  If 7PM-7AM, please contact night-coverage [email or web address]  02/12/2021, 1:34 AM

## 2021-02-12 NOTE — Progress Notes (Signed)
ANTICOAGULATION CONSULT NOTE - follow up  Pharmacy Consult for Heparin (on apixaban PTA) Indication: atrial fibrillation  No Known Allergies  Patient Measurements: Height: 5\' 2"  (157.5 cm) Weight: 71.8 kg (158 lb 4.6 oz) IBW/kg (Calculated) : 50.1 Heparin Dosing Weight: 65 kg  Vital Signs: Temp: 98.2 F (36.8 C) (11/13 1933) Temp Source: Oral (11/13 1933) BP: 128/58 (11/13 1933) Pulse Rate: 61 (11/13 1933)  Labs: Recent Labs    02/11/21 2017 02/12/21 1018 02/12/21 1914  HGB 11.6* 10.3*  --   HCT 34.5* 30.8*  --   PLT 159 142*  --   APTT  --  35 >200*  LABPROT  --  18.0*  --   INR  --  1.5*  --   HEPARINUNFRC  --  >1.10*  --   CREATININE 0.95 0.69  --      Estimated Creatinine Clearance: 50.3 mL/min (by C-G formula based on SCr of 0.69 mg/dL).   Medical History: Past Medical History:  Diagnosis Date   High cholesterol    Hypertension     Medications:  Infusions:   sodium chloride Stopped (02/12/21 0214)   sodium chloride 75 mL/hr at 02/12/21 1550   heparin Stopped (02/13/21 0600)   potassium chloride      Assessment: 80 yoF presented on 11/12 PM with abdominal pain.  PMH significant for Afib on chronic apixaban anticoagulation.  Pharmacy is consulted to dose heparin for Afib while NPO.   CT shows large hiatal hernia containing stomach and colon and a question of a gastric volvulus.  No indication for emergent surgery, but surgery is following for possible procedure.    Last dose of apixaban reportedly taken 11/12 AM.   Baseline HL, aPTT pending.  Expect baseline HL to be artificially elevated d/t recent DOAC use.  Plan to use aPTT for dosing titration until HL/aPTT correlating when DOAC is cleared in several days.   CBC: 11.6, Plt 159 SCr 0.95  1st aPTT > 200s, SUPRAtherapeutic on current IV heparin rate of 950 units/hr.  Per RN, no issues or bleeding noted  Goal of Therapy:  Heparin level 0.3-0.7 units/ml aPTT 66-102 seconds Monitor platelets by  anticoagulation protocol: Yes   Plan:  HOLD heparin x 1 hr then restart heparin IV at rate of 800 units/hr Recheck APTT 8 hours after dose reduction started Daily heparin level and CBC Continue to monitor H&H and platelets    13/12, PharmD, BCPS Secure Chat if ?s 02/12/2021 8:32 PM

## 2021-02-13 ENCOUNTER — Inpatient Hospital Stay (HOSPITAL_COMMUNITY): Payer: Medicare Other | Admitting: Certified Registered Nurse Anesthetist

## 2021-02-13 ENCOUNTER — Encounter (HOSPITAL_COMMUNITY)
Admission: EM | Disposition: A | Discharge status: Home / Self Care | Payer: Self-pay | Source: Home / Self Care | Attending: Internal Medicine

## 2021-02-13 ENCOUNTER — Encounter (HOSPITAL_COMMUNITY): Payer: Self-pay | Admitting: Internal Medicine

## 2021-02-13 DIAGNOSIS — I509 Heart failure, unspecified: Secondary | ICD-10-CM | POA: Diagnosis not present

## 2021-02-13 DIAGNOSIS — K3189 Other diseases of stomach and duodenum: Secondary | ICD-10-CM | POA: Diagnosis not present

## 2021-02-13 DIAGNOSIS — I4891 Unspecified atrial fibrillation: Secondary | ICD-10-CM | POA: Diagnosis not present

## 2021-02-13 DIAGNOSIS — E876 Hypokalemia: Secondary | ICD-10-CM | POA: Diagnosis not present

## 2021-02-13 HISTORY — PX: ESOPHAGOGASTRODUODENOSCOPY (EGD) WITH PROPOFOL: SHX5813

## 2021-02-13 LAB — CBC
HCT: 29.9 % — ABNORMAL LOW (ref 36.0–46.0)
Hemoglobin: 10 g/dL — ABNORMAL LOW (ref 12.0–15.0)
MCH: 31.5 pg (ref 26.0–34.0)
MCHC: 33.4 g/dL (ref 30.0–36.0)
MCV: 94.3 fL (ref 80.0–100.0)
Platelets: 129 10*3/uL — ABNORMAL LOW (ref 150–400)
RBC: 3.17 MIL/uL — ABNORMAL LOW (ref 3.87–5.11)
RDW: 15.1 % (ref 11.5–15.5)
WBC: 4.9 10*3/uL (ref 4.0–10.5)
nRBC: 0 % (ref 0.0–0.2)

## 2021-02-13 LAB — BASIC METABOLIC PANEL
Anion gap: 8 (ref 5–15)
BUN: 8 mg/dL (ref 8–23)
CO2: 25 mmol/L (ref 22–32)
Calcium: 8.5 mg/dL — ABNORMAL LOW (ref 8.9–10.3)
Chloride: 108 mmol/L (ref 98–111)
Creatinine, Ser: 0.62 mg/dL (ref 0.44–1.00)
GFR, Estimated: >60 mL/min (ref >60)
Glucose, Bld: 77 mg/dL (ref 70–99)
Potassium: 3.8 mmol/L (ref 3.5–5.1)
Sodium: 141 mmol/L (ref 135–145)

## 2021-02-13 LAB — APTT
aPTT: 116 seconds — ABNORMAL HIGH (ref 24–36)
aPTT: 82 seconds — ABNORMAL HIGH (ref 24–36)

## 2021-02-13 LAB — HEPARIN LEVEL (UNFRACTIONATED): Heparin Unfractionated: >1.1 IU/mL — ABNORMAL HIGH (ref 0.30–0.70)

## 2021-02-13 SURGERY — ESOPHAGOGASTRODUODENOSCOPY (EGD) WITH PROPOFOL
Anesthesia: General

## 2021-02-13 MED ORDER — DEXAMETHASONE SODIUM PHOSPHATE 10 MG/ML IJ SOLN
INTRAMUSCULAR | Status: DC | PRN
  Administered 2021-02-13: 5 mg via INTRAVENOUS

## 2021-02-13 MED ORDER — LIDOCAINE 2% (20 MG/ML) 5 ML SYRINGE
INTRAMUSCULAR | Status: DC | PRN
  Administered 2021-02-13: 80 mg via INTRAVENOUS

## 2021-02-13 MED ORDER — LACTATED RINGERS IV SOLN
INTRAVENOUS | Status: AC | PRN
  Administered 2021-02-13: 1000 mL via INTRAVENOUS

## 2021-02-13 MED ORDER — FENTANYL CITRATE (PF) 100 MCG/2ML IJ SOLN
INTRAMUSCULAR | Status: DC | PRN
  Administered 2021-02-13: 25 ug via INTRAVENOUS
  Administered 2021-02-13: 50 ug via INTRAVENOUS

## 2021-02-13 MED ORDER — FENTANYL CITRATE (PF) 100 MCG/2ML IJ SOLN
INTRAMUSCULAR | Status: AC
  Filled 2021-02-13: qty 2

## 2021-02-13 MED ORDER — PROPOFOL 10 MG/ML IV BOLUS
INTRAVENOUS | Status: DC | PRN
  Administered 2021-02-13: 100 mg via INTRAVENOUS

## 2021-02-13 MED ORDER — HEPARIN (PORCINE) 25000 UT/250ML-% IV SOLN
800.0000 [IU]/h | INTRAVENOUS | Status: DC
  Administered 2021-02-13 – 2021-02-15 (×2): 700 [IU]/h via INTRAVENOUS
  Filled 2021-02-13 (×2): qty 250

## 2021-02-13 MED ORDER — SUCCINYLCHOLINE CHLORIDE 200 MG/10ML IV SOSY
PREFILLED_SYRINGE | INTRAVENOUS | Status: DC | PRN
  Administered 2021-02-13: 180 mg via INTRAVENOUS

## 2021-02-13 MED ORDER — SODIUM CHLORIDE 0.9 % IV SOLN: INTRAVENOUS | Status: DC

## 2021-02-13 MED ORDER — ONDANSETRON HCL 4 MG/2ML IJ SOLN
INTRAMUSCULAR | Status: DC | PRN
  Administered 2021-02-13: 4 mg via INTRAVENOUS

## 2021-02-13 SURGICAL SUPPLY — 15 items

## 2021-02-13 NOTE — Anesthesia Preprocedure Evaluation (Addendum)
Anesthesia Evaluation  Patient identified by MRN, date of birth, ID band Patient awake    Reviewed: Allergy & Precautions, H&P , NPO status , Patient's Chart, lab work & pertinent test results, reviewed documented beta blocker date and time   Airway Mallampati: I  TM Distance: >3 FB Neck ROM: full    Dental no notable dental hx. (+) Edentulous Lower, Poor Dentition, Missing,    Pulmonary neg pulmonary ROS,    Pulmonary exam normal breath sounds clear to auscultation       Cardiovascular Exercise Tolerance: Good hypertension, Pt. on medications +CHF  + dysrhythmias Atrial Fibrillation + pacemaker  Rhythm:regular Rate:Normal     Neuro/Psych negative neurological ROS  negative psych ROS   GI/Hepatic Neg liver ROS, hiatal hernia,   Endo/Other  negative endocrine ROS  Renal/GU negative Renal ROS  negative genitourinary   Musculoskeletal   Abdominal   Peds  Hematology  (+) Blood dyscrasia, anemia ,   Anesthesia Other Findings   Reproductive/Obstetrics negative OB ROS                           Anesthesia Physical Anesthesia Plan  ASA: 4  Anesthesia Plan: General   Post-op Pain Management:    Induction: Cricoid pressure planned and Intravenous  PONV Risk Score and Plan: 3 and Ondansetron  Airway Management Planned: Oral ETT  Additional Equipment: None  Intra-op Plan:   Post-operative Plan: Extubation in OR  Informed Consent: I have reviewed the patients History and Physical, chart, labs and discussed the procedure including the risks, benefits and alternatives for the proposed anesthesia with the patient or authorized representative who has indicated his/her understanding and acceptance.     Dental Advisory Given  Plan Discussed with: CRNA and Anesthesiologist  Anesthesia Plan Comments:        Anesthesia Quick Evaluation

## 2021-02-13 NOTE — Anesthesia Procedure Notes (Signed)
Procedure Name: Intubation Date/Time: 02/13/2021 12:16 PM Performed by: West Pugh, CRNA Pre-anesthesia Checklist: Patient identified, Emergency Drugs available, Suction available, Patient being monitored and Timeout performed Patient Re-evaluated:Patient Re-evaluated prior to induction Oxygen Delivery Method: Circle system utilized Preoxygenation: Pre-oxygenation with 100% oxygen Induction Type: IV induction, Rapid sequence and Cricoid Pressure applied Laryngoscope Size: Mac and 3 Grade View: Grade I Tube type: Oral Airway Equipment and Method: Stylet Placement Confirmation: ETT inserted through vocal cords under direct vision, positive ETCO2, CO2 detector and breath sounds checked- equal and bilateral Tube secured with: Tape Dental Injury: Teeth and Oropharynx as per pre-operative assessment

## 2021-02-13 NOTE — Plan of Care (Signed)
  Problem: Education: Goal: Knowledge of General Education information will improve Description: Including pain rating scale, medication(s)/side effects and non-pharmacologic comfort measures Outcome: Progressing   Problem: Health Behavior/Discharge Planning: Goal: Ability to manage health-related needs will improve Outcome: Progressing   Problem: Clinical Measurements: Goal: Ability to maintain clinical measurements within normal limits will improve Outcome: Progressing Goal: Will remain free from infection Outcome: Progressing Goal: Diagnostic test results will improve Outcome: Progressing Goal: Cardiovascular complication will be avoided Outcome: Progressing   Problem: Nutrition: Goal: Adequate nutrition will be maintained Outcome: Progressing   Problem: Elimination: Goal: Will not experience complications related to bowel motility Outcome: Progressing   Problem: Pain Managment: Goal: General experience of comfort will improve Outcome: Progressing   Problem: Safety: Goal: Ability to remain free from injury will improve Outcome: Progressing   Problem: Skin Integrity: Goal: Risk for impaired skin integrity will decrease Outcome: Progressing   

## 2021-02-13 NOTE — Progress Notes (Signed)
ANTICOAGULATION CONSULT NOTE - follow up  Pharmacy Consult for Heparin (on apixaban PTA) Indication: atrial fibrillation  No Known Allergies  Patient Measurements: Height: 5\' 2"  (157.5 cm) Weight: 71.8 kg (158 lb 4.6 oz) IBW/kg (Calculated) : 50.1 Heparin Dosing Weight: 65 kg  Vital Signs: Temp: 98.8 F (37.1 C) (11/14 2036) Temp Source: Oral (11/14 2036) BP: 117/53 (11/14 2036) Pulse Rate: 69 (11/14 2036)  Labs: Recent Labs    02/11/21 2017 02/11/21 2017 02/12/21 1018 02/12/21 1914 02/13/21 0507 02/13/21 2302  HGB 11.6*  --  10.3*  --  10.0*  --   HCT 34.5*  --  30.8*  --  29.9*  --   PLT 159  --  142*  --  129*  --   APTT  --    < > 35 >200* 116* 82*  LABPROT  --   --  18.0*  --   --   --   INR  --   --  1.5*  --   --   --   HEPARINUNFRC  --   --  >1.10*  --  >1.10*  --   CREATININE 0.95  --  0.69  --  0.62  --    < > = values in this interval not displayed.     Estimated Creatinine Clearance: 50.3 mL/min (by C-G formula based on SCr of 0.62 mg/dL).   Medical History: Past Medical History:  Diagnosis Date   High cholesterol    Hypertension     Medications:  Infusions:   sodium chloride Stopped (02/12/21 0214)   sodium chloride 75 mL/hr at 02/13/21 2134   heparin 700 Units/hr (02/13/21 1552)    Assessment: 80 yoF presented on 11/12 PM with abdominal pain.  PMH significant for Afib on chronic apixaban anticoagulation.  Pharmacy is consulted to dose heparin for Afib while NPO.   CT shows large hiatal hernia containing stomach and colon and a question of a gastric volvulus.  No indication for emergent surgery, but surgery is following for possible procedure.    Last dose of apixaban reportedly taken 11/12 AM.   Baseline HL, aPTT pending.  Expect baseline HL to be artificially elevated d/t recent DOAC use.  Plan to use aPTT for dosing titration until HL/aPTT correlating when DOAC is cleared in several days.   CBC: 11.6, Plt 159 SCr  0.95  02/13/2021: aPTT 82 therapeutic on 700 units/hr CBC- low but relatively stable over past few days No bleeding or infusion related concerns per RN Heparin was stopped this AM prior to EGD procedure Ok to resume 2 hours after end of procedure per GI   Goal of Therapy:  Heparin level 0.3-0.7 units/ml aPTT 66-102 seconds Monitor platelets by anticoagulation protocol: Yes   Plan:  continue  IV heparin at  700 units/hr  confirmatory APTT in 8 hours  Daily heparin level and CBC Continue to monitor H&H and platelets   02/15/2021 RPh 02/13/2021, 11:56 PM

## 2021-02-13 NOTE — Progress Notes (Signed)
ANTICOAGULATION CONSULT NOTE - follow up  Pharmacy Consult for Heparin (on apixaban PTA) Indication: atrial fibrillation  No Known Allergies  Patient Measurements: Height: 5\' 2"  (157.5 cm) Weight: 71.8 kg (158 lb 4.6 oz) IBW/kg (Calculated) : 50.1 Heparin Dosing Weight: 65 kg  Vital Signs: Temp: 97.7 F (36.5 C) (11/14 0614) Temp Source: Oral (11/14 0614) BP: 122/62 (11/14 0614) Pulse Rate: 60 (11/14 0614)  Labs: Recent Labs    02/11/21 2017 02/12/21 1018 02/12/21 1914 02/13/21 0507  HGB 11.6* 10.3*  --  10.0*  HCT 34.5* 30.8*  --  29.9*  PLT 159 142*  --  129*  APTT  --  35 >200* 116*  LABPROT  --  18.0*  --   --   INR  --  1.5*  --   --   HEPARINUNFRC  --  >1.10*  --  >1.10*  CREATININE 0.95 0.69  --  0.62     Estimated Creatinine Clearance: 50.3 mL/min (by C-G formula based on SCr of 0.62 mg/dL).   Medical History: Past Medical History:  Diagnosis Date   High cholesterol    Hypertension     Medications:  Infusions:   sodium chloride Stopped (02/12/21 0214)   sodium chloride 75 mL/hr at 02/13/21 0618   heparin 800 Units/hr (02/12/21 2157)    Assessment: 70 yoF presented on 11/12 PM with abdominal pain.  PMH significant for Afib on chronic apixaban anticoagulation.  Pharmacy is consulted to dose heparin for Afib while NPO.   CT shows large hiatal hernia containing stomach and colon and a question of a gastric volvulus.  No indication for emergent surgery, but surgery is following for possible procedure.    Last dose of apixaban reportedly taken 11/12 AM.   Baseline HL, aPTT pending.  Expect baseline HL to be artificially elevated d/t recent DOAC use.  Plan to use aPTT for dosing titration until HL/aPTT correlating when DOAC is cleared in several days.   CBC: 11.6, Plt 159 SCr 0.95  02/13/2021: aPTT > 116, remains slightly above therapeutic goal despite decreased heparin rate Heparin level>1.1 CBC- low but relatively stable over past few days No  bleeding or infusion related concerns per RN  Goal of Therapy:  Heparin level 0.3-0.7 units/ml aPTT 66-102 seconds Monitor platelets by anticoagulation protocol: Yes   Plan:  Decrease IV heparin to 700 units/hr Recheck APTT 8 hours after dose reduction  Daily heparin level and CBC Continue to monitor H&H and platelets  02/15/2021, PharmD, BCPS 02/13/2021 6:22 AM

## 2021-02-13 NOTE — Plan of Care (Signed)
Pt aox4 this am, forgetful. Pleasant and cooperative with staff.  For EGD this afternoon with GI service.  Heparin drip paused at 630am per orders.  VS and Labs acceptable.   Problem: Clinical Measurements: Goal: Ability to maintain clinical measurements within normal limits will improve Outcome: Progressing   Problem: Nutrition: Goal: Adequate nutrition will be maintained Outcome: Progressing   Problem: Coping: Goal: Level of anxiety will decrease Outcome: Progressing   Problem: Pain Managment: Goal: General experience of comfort will improve Outcome: Progressing   Problem: Safety: Goal: Ability to remain free from injury will improve Outcome: Progressing   Problem: Skin Integrity: Goal: Risk for impaired skin integrity will decrease Outcome: Progressing

## 2021-02-13 NOTE — Transfer of Care (Signed)
Immediate Anesthesia Transfer of Care Note  Patient: Danielle Blanchard  Procedure(s) Performed: ESOPHAGOGASTRODUODENOSCOPY (EGD) WITH PROPOFOL  Patient Location: PACU and Endoscopy Unit  Anesthesia Type:General  Level of Consciousness: awake, drowsy and patient cooperative  Airway & Oxygen Therapy: Patient Spontanous Breathing and Patient connected to face mask oxygen  Post-op Assessment: Report given to RN and Post -op Vital signs reviewed and stable  Post vital signs: Reviewed and stable  Last Vitals:  Vitals Value Taken Time  BP 164/65 02/13/21 1251  Temp 36.7 C 02/13/21 1249  Pulse 61 02/13/21 1251  Resp 16 02/13/21 1251  SpO2 100 % 02/13/21 1251  Vitals shown include unvalidated device data.  Last Pain:  Vitals:   02/13/21 1251  TempSrc:   PainSc: 0-No pain      Patients Stated Pain Goal: 2 (02/12/21 0204)  Complications: No notable events documented.

## 2021-02-13 NOTE — Interval H&P Note (Signed)
History and Physical Interval Note:  02/13/2021 12:11 PM  Danielle Blanchard  has presented today for surgery, with the diagnosis of Gastric volvulus.  The various methods of treatment have been discussed with the patient and family. After consideration of risks, benefits and other options for treatment, the patient has consented to  Procedure(s): ESOPHAGOGASTRODUODENOSCOPY (EGD) WITH PROPOFOL (N/A) as a surgical intervention.  The patient's history has been reviewed, patient examined, no change in status, stable for surgery.  I have reviewed the patient's chart and labs.  Questions were answered to the patient's satisfaction.     Freddy Jaksch

## 2021-02-13 NOTE — Progress Notes (Signed)
PROGRESS NOTE    Sherrita Wesch  YCX:[id number] DOB: 1940-09-12 DOA: 02/11/2021 PCP: Pcp, No    Chief Complaint  Patient presents with   Abdominal Pain    Brief Narrative:  Patient 80 year old female history of A. fib on chronic anticoagulation with Eliquis, SSS status post PPM, chronic diastolic CHF, hypertension, hyperlipidemia, GERD presenting with abdominal pain.  COVID-19 PCR, influenza PCR negative.  CT abdomen and pelvis with large hiatal hernia with findings concerning for gastric volvulus and obstruction.  Patient placed on bowel rest.  General surgery consulted.  GI consulted and pending.   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  No nausea or vomiting.  CT concerning for large hiatal hernia with findings concerning for gastric volvulus and obstruction. -NG tube placement recommended however resistance noted in NG tube not placed. -Patient with no further nausea or vomiting. -Eliquis on hold and patient placed on IV heparin for bridging. -Patient seen in consultation by general surgery and is felt that patient with no current acute abdomen and as such emergent surgery not needed at this time and recommending GI evaluation for possible upper endoscopy to define anatomy. -Patient seen in consultation by GI and patient for upper endoscopy today 02/13/2021. -Per GI and general surgery.  2.  Atrial fibrillation -Rate currently controlled. -Patient currently n.p.o. -Eliquis on hold and patient on heparin for anticoagulation due to concern for possible procedure. -Heparin on hold this morning for upper endoscopy.  3.  SSS status post PPM   4.  Chronic diastolic CHF -Currently  euvolemic. -Monitor volume status with gentle hydration.  5.  Hypertension -Stable. -IV hydralazine as needed.  6.  T12 compression fracture -Noted on CT and increased compared to 12/28/2020. -Patient currently asymptomatic and neurologically intact. -Symptomatic treatment. -Outpatient follow-up.  6.  Hypokalemia -Repleted.   -Potassium at 3.8.    DVT prophylaxis: Heparin Code Status: Full Family Communication: Updated patient.  No family at bedside. Disposition:   Status is: Inpatient  Remains inpatient appropriate because: Severity of illness       Consultants:  General surgery: Dr. Magnus Ivan 02/12/2021 GI: Dr Ewing Schlein 02/12/2021  Procedures:  CT abdomen and pelvis 02/11/2021 Upper endoscopy pending   Antimicrobials:  None   Subjective: Laying in bed.  Denies any significant abdominal pain but does complain of some abdominal discomfort.  No nausea or emesis.  No chest pain.  No shortness of breath.  Awaiting upper endoscopy to be done today.    Objective: Vitals:   02/12/21 0500 02/12/21 1335 02/12/21 1933 02/13/21 0614  BP: 130/77 (!) 128/59 (!) 128/58 122/62  Pulse: (!) 59 (!) 59 61 60  Resp: 16 17 16 18   Temp: 97.9 F (36.6 C) 98.4 F (36.9 C) 98.2 F (36.8 C) 97.7 F (36.5 C)  TempSrc: Oral Oral Oral Oral  SpO2: 98% 96% 93% 94%  Weight:      Height:        Intake/Output Summary (Last 24 hours) at 02/13/2021 1049 Last data filed at 02/13/2021 0310 Gross per 24 hour  Intake 1991.22 ml  Output --  Net 1991.22 ml    Filed Weights   02/12/21 0129  Weight: 71.8 kg    Examination:  General  exam: : NAD Respiratory system: CTA B anterior lung fields.  No wheezes, no rhonchi.  Speaking in full sentences.  Normal respiratory effort. Cardiovascular system: Regular rate and rhythm no murmurs rubs or gallops.  No JVD.  No lower extremity edema.  Gastrointestinal system: Abdomen soft, nondistended, some slight tenderness to palpation in the  epigastric region, positive bowel sounds.  No rebound.  No guarding. Central nervous system: Alert and oriented. No focal neurological deficits. Extremities: Symmetric 5 x 5 power. Skin: No rashes, lesions or ulcers Psychiatry: Judgement and insight appear normal. Mood & affect appropriate.   Data Reviewed: I have personally reviewed following labs and imaging studies  CBC: Recent Labs  Lab 02/11/21 2017 02/12/21 1018 02/13/21 0507  WBC 5.8 4.4 4.9  NEUTROABS 3.9 2.8  --   HGB 11.6* 10.3* 10.0*  HCT 34.5* 30.8* 29.9*  MCV 92.7 93.6 94.3  PLT 159 142* 129*     Basic Metabolic Panel: Recent Labs  Lab 02/11/21 2017 02/12/21 1018 02/13/21 0507  NA 136 138 141  K 2.6* 3.5 3.8  CL 102 103 108  CO2 27 28 25   GLUCOSE 120* 88 77  BUN 17 13 8   CREATININE 0.95 0.69 0.62  CALCIUM 9.1 8.4* 8.5*  MG 2.2 2.0  --      GFR: Estimated Creatinine Clearance: 50.3 mL/min (by C-G formula based on SCr of 0.62 mg/dL).  Liver Function Tests: Recent Labs  Lab 02/11/21 2017 02/12/21 1018  AST 31 23  ALT 27 22  ALKPHOS 87 70  BILITOT 0.5 0.4  PROT 7.5 6.0*  ALBUMIN 4.3 3.4*     CBG: No results for input(s): GLUCAP in the last 168 hours.   Recent Results (from the past 240 hour(s))  Resp Panel by RT-PCR (Flu A&B, Covid) Nasopharyngeal Swab     Status: None   Collection Time: 02/11/21 11:50 PM   Specimen: Nasopharyngeal Swab; Nasopharyngeal(NP) swabs in vial transport medium  Result Value Ref Range Status   SARS Coronavirus 2 by RT PCR NEGATIVE NEGATIVE Final    Comment: (NOTE) SARS-CoV-2 target nucleic acids are NOT DETECTED.  The SARS-CoV-2 RNA is generally detectable in upper respiratory specimens during the acute phase of infection. The lowest concentration of SARS-CoV-2 viral copies this assay can detect is 138 copies/mL. A negative result does not preclude SARS-Cov-2 infection and should not be used as the sole basis for treatment or other patient management  decisions. A negative result may occur with  improper specimen collection/handling, submission of specimen other than nasopharyngeal swab, presence of viral mutation(s) within the areas targeted by this assay, and inadequate number of viral copies(<138 copies/mL). A negative result must be combined with clinical observations, patient history, and epidemiological information. The expected result is Negative.  Fact Sheet for Patients:  02/14/21  Fact Sheet for Healthcare Providers:  13/12/22  This test is no t yet approved or cleared by the [employer] FDA and  has been authorized for detection and/or diagnosis of SARS-CoV-2 by FDA under an Emergency Use Authorization (EUA). This EUA will remain  in effect (meaning this test can be used) for the duration of the COVID-19 declaration under Section 564(b)(1) of the Act, 21 U.S.C.section 360bbb-3(b)(1), unless the authorization is terminated  or revoked sooner.       Influenza A by PCR NEGATIVE NEGATIVE Final   Influenza B by PCR NEGATIVE NEGATIVE Final    Comment: (NOTE) The Xpert Xpress SARS-CoV-2/FLU/RSV plus assay is intended as an aid in the  diagnosis of influenza from Nasopharyngeal swab specimens and should not be used as a sole basis for treatment. Nasal washings and aspirates are unacceptable for Xpert Xpress SARS-CoV-2/FLU/RSV testing.  Fact Sheet for Patients: EntrepreneurPulse.com.au  Fact Sheet for Healthcare Providers: IncredibleEmployment.be  This test is not yet approved or cleared by the [place] FDA and has been authorized for detection and/or diagnosis of SARS-CoV-2 by FDA under an Emergency Use Authorization (EUA). This EUA will remain in effect (meaning this test can be used) for the duration of the COVID-19 declaration under Section 564(b)(1) of the Act, 21 U.S.C. section 360bbb-3(b)(1), unless the  authorization is terminated or revoked.  Performed at [facility], [place] [address] [place], [place] [address]           Radiology Studies: CT ABDOMEN PELVIS W CONTRAST  Result Date: 02/11/2021 CLINICAL DATA:  Evaluate for bowel obstruction. Left upper quadrant abdominal pain. EXAM: CT ABDOMEN AND PELVIS WITH CONTRAST TECHNIQUE: Multidetector CT imaging of the abdomen and pelvis was performed using the standard protocol following bolus administration of intravenous contrast. CONTRAST:  69mL OMNIPAQUE IOHEXOL 350 MG/ML SOLN COMPARISON:  CT abdomen and pelvis 12/28/2020. FINDINGS: Lower chest: No acute abnormality. Hepatobiliary: No focal liver abnormality is seen. Status post cholecystectomy. No biliary dilatation. Pancreas: Unremarkable. No pancreatic ductal dilatation or surrounding inflammatory changes. Spleen: Normal in size without focal abnormality. Adrenals/Urinary Tract: Adrenal glands are unremarkable. Kidneys are normal, without renal calculi, focal lesion, or hydronephrosis. Bladder is unremarkable. Stomach/Bowel: There is sigmoid colon diverticulosis without evidence for diverticulitis. The appendix is likely surgically absent. Small bowel and colon are nondilated. Again seen is a large hiatal hernia. The gastric body and fundus are now below the diaphragm, new finding. The gastric antrum remains within the hernia. The intrathoracic and intra-abdominal portions of the stomach are dilated with air-fluid levels. There is no pneumatosis or free air. Vascular/Lymphatic: Aortic atherosclerosis. No enlarged abdominal or pelvic lymph nodes. Reproductive: Uterus and bilateral adnexa are unremarkable. Other: No abdominal wall hernia or abnormality. There is a small fat containing left inguinal hernia. There is no ascites. Musculoskeletal: T12 inferior endplate compression fracture has increased when compared to the prior study. IMPRESSION: 1. Large hiatal hernia is again seen.  In the interval, the gastric fundus has change locations and is now intraabdominal while the gastric antrum remains in the hernia. Both intrathoracic and intra-abdominal portions of the stomach are dilated with air-fluid levels. 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 Surgical consultation recommended. 2. T12 compression fracture has increased compared to 12/28/2020. Electronically Signed   By: Ronney Asters M.D.   On: 02/11/2021 23:25        Scheduled Meds:  pantoprazole (PROTONIX) IV  40 mg Intravenous Q24H   Continuous Infusions:  sodium chloride Stopped (02/12/21 0214)   sodium chloride 75 mL/hr at 02/13/21 0618     LOS: 1 day    Time spent: 35 minutes     Irine Seal, MD [facility]   To contact the attending provider between 7A-7P or the covering provider during after hours 7P-7A, please log into the web site [email or web address] and access using universal [place] password for that web site. If you do not have the password, please call the hospital operator.  02/13/2021, 10:49 AM

## 2021-02-13 NOTE — Op Note (Signed)
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 Estimated Blood Loss:     Estimated blood loss: none. Procedure:                Pre-Anesthesia Assessment:                           - Prior to the procedure, a History and Physical                            was performed, and patient medications and                            allergies were reviewed. The patient's tolerance of                            previous anesthesia was also reviewed. The risks                            and benefits of the procedure and the sedation                            options and risks were discussed with the patient.                            All questions were answered, and informed consent                            was obtained. Prior Anticoagulants: The patient has                            taken Eliquis (apixaban), last dose was 2 days                            prior to procedure. ASA Grade Assessment: IV - A  patient with severe systemic disease that is a                            constant threat to life. After reviewing the risks                            and benefits, the patient was deemed in                            satisfactory condition to undergo the procedure.                           After obtaining informed consent, the endoscope was                            passed under direct vision.  Throughout the                            procedure, the patient's blood pressure, pulse, and                            oxygen saturations were monitored continuously. The                            [id number]-[id number] ([id number]) Olympus endoscope was introduced                            through the mouth, and advanced to the second part                            of duodenum. The upper GI endoscopy was                            accomplished without difficulty. The patient                            tolerated the procedure well. Scope In: Scope Out: Findings:      The examined esophagus was normal.      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 Decompression       done as best possible. No ischemic/gangrenous changes noted.      The duodenal bulb, first portion of the duodenum and second portion of       the duodenum were normal. Impression:               - Normal esophagus.                           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                           -  Normal duodenal bulb, first portion of the                            duodenum and second portion of the duodenum.                           - Attempts by both CRNA and anesthesiologist                            unsuccessful in placement of nasogastric tube                            through the nasopharynx, thus NGT unable to be                            placed. Orogastric tube felt to be high risk to be                            removed or dislodged during extubation, thus not                            done. Moderate Sedation:      Not Applicable - Patient had care per Anesthesia. Recommendation:           - Return patient to hospital ward for ongoing care.                           - NPO until further notice.                           - Continue present medications.                            - [facility] will follow at a distance.                           - Management per surgical team. Procedure Code(s):        --- Professional ---                           [phone], Esophagogastroduodenoscopy, flexible,                            transoral; diagnostic, including collection of                            specimen(s) by brushing or washing, when performed                            (separate procedure) Diagnosis Code(s):        --- Professional ---                           K31.89, Other diseases of stomach and duodenum  R93.3, Abnormal findings on diagnostic imaging of                            other parts of digestive tract CPT copyright 2019 American Medical Association. All rights reserved. The codes documented in this report are preliminary and upon coder review may  be revised to meet current compliance requirements. Arta Silence, MD 02/13/2021 12:58:38 PM This report has been signed electronically. Number of Addenda: 0

## 2021-02-13 NOTE — Progress Notes (Signed)
Subjective: CC: Patient reports she has had no abdominal pain for the last 1-2 days.  Denies any nausea or vomiting.  She is unsure about flatus.  She reports last BM 11/11.  GI planning EGD today. She denies any prior abdominal surgeries.   Objective: Vital signs in last 24 hours: Temp:  [97.7 F (36.5 C)-98.4 F (36.9 C)] 97.7 F (36.5 C) (11/14 0614) Pulse Rate:  [59-61] 60 (11/14 0614) Resp:  [16-18] 18 (11/14 0614) BP: (122-128)/(58-62) 122/62 (11/14 0614) SpO2:  [93 %-96 %] 94 % (11/14 0614) Last BM Date: 02/10/21  Intake/Output from previous day: 11/13 0701 - 11/14 0700 In: 1991.2 [I.V.:1335.6; IV Piggyback:655.6] Out: -  Intake/Output this shift: No intake/output data recorded.  PE: Gen:  Alert, NAD, pleasant Card:  Reg Pulm:  Normal rate and effort  Abd: Soft, ND, NT +BS Ext:  No LE edema Psych: A&Ox4 Skin: no rashes noted, warm and dry  Lab Results:  Recent Labs    02/12/21 1018 02/13/21 0507  WBC 4.4 4.9  HGB 10.3* 10.0*  HCT 30.8* 29.9*  PLT 142* 129*   BMET Recent Labs    02/12/21 1018 02/13/21 0507  NA 138 141  K 3.5 3.8  CL 103 108  CO2 28 25  GLUCOSE 88 77  BUN 13 8  CREATININE 0.69 0.62  CALCIUM 8.4* 8.5*   PT/INR Recent Labs    02/12/21 1018  LABPROT 18.0*  INR 1.5*   CMP     Component Value Date/Time   NA 141 02/13/2021 0507   K 3.8 02/13/2021 0507   CL 108 02/13/2021 0507   CO2 25 02/13/2021 0507   GLUCOSE 77 02/13/2021 0507   BUN 8 02/13/2021 0507   CREATININE 0.62 02/13/2021 0507   CALCIUM 8.5 (L) 02/13/2021 0507   PROT 6.0 (L) 02/12/2021 1018   ALBUMIN 3.4 (L) 02/12/2021 1018   AST 23 02/12/2021 1018   ALT 22 02/12/2021 1018   ALKPHOS 70 02/12/2021 1018   BILITOT 0.4 02/12/2021 1018   GFRNONAA >60 02/13/2021 0507   GFRAA >60 12/05/2019 2254   Lipase     Component Value Date/Time   LIPASE 42 02/11/2021 2017    Studies/Results: CT ABDOMEN PELVIS W CONTRAST  Result Date: 02/11/2021 CLINICAL  DATA:  Evaluate for bowel obstruction. Left upper quadrant abdominal pain. EXAM: CT ABDOMEN AND PELVIS WITH CONTRAST TECHNIQUE: Multidetector CT imaging of the abdomen and pelvis was performed using the standard protocol following bolus administration of intravenous contrast. CONTRAST:  35mL OMNIPAQUE IOHEXOL 350 MG/ML SOLN COMPARISON:  CT abdomen and pelvis 12/28/2020. FINDINGS: Lower chest: No acute abnormality. Hepatobiliary: No focal liver abnormality is seen. Status post cholecystectomy. No biliary dilatation. Pancreas: Unremarkable. No pancreatic ductal dilatation or surrounding inflammatory changes. Spleen: Normal in size without focal abnormality. Adrenals/Urinary Tract: Adrenal glands are unremarkable. Kidneys are normal, without renal calculi, focal lesion, or hydronephrosis. Bladder is unremarkable. Stomach/Bowel: There is sigmoid colon diverticulosis without evidence for diverticulitis. The appendix is likely surgically absent. Small bowel and colon are nondilated. Again seen is a large hiatal hernia. The gastric body and fundus are now below the diaphragm, new finding. The gastric antrum remains within the hernia. The intrathoracic and intra-abdominal portions of the stomach are dilated with air-fluid levels. There is no pneumatosis or free air. Vascular/Lymphatic: Aortic atherosclerosis. No enlarged abdominal or pelvic lymph nodes. Reproductive: Uterus and bilateral adnexa are unremarkable. Other: No abdominal wall hernia or abnormality. There is a small fat  containing left inguinal hernia. There is no ascites. Musculoskeletal: T12 inferior endplate compression fracture has increased when compared to the prior study. IMPRESSION: 1. Large hiatal hernia is again seen. In the interval, the gastric fundus has change locations and is now intraabdominal while the gastric antrum remains in the hernia. Both intrathoracic and intra-abdominal portions of the stomach are dilated with air-fluid levels. Findings  are worrisome for gastric volvulus and obstruction. Surgical consultation recommended. 2. T12 compression fracture has increased compared to 12/28/2020. Electronically Signed   By: Darliss Cheney M.D.   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 On 11/03/20) HTN T12 compression fx   LOS: 1 day    Jacinto Halim , [facility] 02/13/2021, 8:58 AM Please see Amion for pager number during day hours 7:00am-4:30pm

## 2021-02-13 NOTE — Anesthesia Postprocedure Evaluation (Signed)
Anesthesia Post Note  Patient: Danielle Blanchard  Procedure(s) Performed: ESOPHAGOGASTRODUODENOSCOPY (EGD) WITH PROPOFOL     Patient location during evaluation: PACU Anesthesia Type: General Level of consciousness: awake and alert Pain management: pain level controlled Vital Signs Assessment: post-procedure vital signs reviewed and stable Respiratory status: spontaneous breathing, nonlabored ventilation, respiratory function stable and patient connected to nasal cannula oxygen Cardiovascular status: blood pressure returned to baseline and stable Postop Assessment: no apparent nausea or vomiting Anesthetic complications: no   No notable events documented.  Last Vitals:  Vitals:   02/13/21 1320 02/13/21 1347  BP: (!) 147/49 (!) 134/50  Pulse: (!) 59 60  Resp: 16 18  Temp:  37.5 C  SpO2: 96% 95%    Last Pain:  Vitals:   02/13/21 1347  TempSrc: Oral  PainSc:                  [name]

## 2021-02-13 NOTE — Progress Notes (Signed)
ANTICOAGULATION CONSULT NOTE - follow up  Pharmacy Consult for Heparin (on apixaban PTA) Indication: atrial fibrillation  No Known Allergies  Patient Measurements: Height: 5\' 2"  (157.5 cm) Weight: 71.8 kg (158 lb 4.6 oz) IBW/kg (Calculated) : 50.1 Heparin Dosing Weight: 65 kg  Vital Signs: Temp: 99.5 F (37.5 C) (11/14 1347) Temp Source: Oral (11/14 1347) BP: 134/50 (11/14 1347) Pulse Rate: 60 (11/14 1347)  Labs: Recent Labs    02/11/21 2017 02/12/21 1018 02/12/21 1914 02/13/21 0507  HGB 11.6* 10.3*  --  10.0*  HCT 34.5* 30.8*  --  29.9*  PLT 159 142*  --  129*  APTT  --  35 >200* 116*  LABPROT  --  18.0*  --   --   INR  --  1.5*  --   --   HEPARINUNFRC  --  >1.10*  --  >1.10*  CREATININE 0.95 0.69  --  0.62     Estimated Creatinine Clearance: 50.3 mL/min (by C-G formula based on SCr of 0.62 mg/dL).   Medical History: Past Medical History:  Diagnosis Date   High cholesterol    Hypertension     Medications:  Infusions:   sodium chloride Stopped (02/12/21 0214)   sodium chloride 75 mL/hr at 02/13/21 0618   heparin      Assessment: 69 yoF presented on 11/12 PM with abdominal pain.  PMH significant for Afib on chronic apixaban anticoagulation.  Pharmacy is consulted to dose heparin for Afib while NPO.   CT shows large hiatal hernia containing stomach and colon and a question of a gastric volvulus.  No indication for emergent surgery, but surgery is following for possible procedure.    Last dose of apixaban reportedly taken 11/12 AM.   Baseline HL, aPTT pending.  Expect baseline HL to be artificially elevated d/t recent DOAC use.  Plan to use aPTT for dosing titration until HL/aPTT correlating when DOAC is cleared in several days.   CBC: 11.6, Plt 159 SCr 0.95  02/13/2021: aPTT > 116, remains slightly above therapeutic goal despite decreased heparin rate Heparin level>1.1 CBC- low but relatively stable over past few days No bleeding or infusion related  concerns per RN Heparin was stopped this AM prior to EGD procedure Ok to resume 2 hours after end of procedure per GI   Goal of Therapy:  Heparin level 0.3-0.7 units/ml aPTT 66-102 seconds Monitor platelets by anticoagulation protocol: Yes   Plan:  Resume  IV heparin at  700 units/hr at 1500  Recheck APTT 8 hours after restart Daily heparin level and CBC Continue to monitor H&H and platelets   02/15/2021, PharmD, BCPS 02/13/2021 2:11 PM

## 2021-02-14 ENCOUNTER — Inpatient Hospital Stay (HOSPITAL_COMMUNITY): Payer: Medicare Other

## 2021-02-14 DIAGNOSIS — K3189 Other diseases of stomach and duodenum: Secondary | ICD-10-CM | POA: Diagnosis not present

## 2021-02-14 DIAGNOSIS — E876 Hypokalemia: Secondary | ICD-10-CM | POA: Diagnosis not present

## 2021-02-14 DIAGNOSIS — I4891 Unspecified atrial fibrillation: Secondary | ICD-10-CM | POA: Diagnosis not present

## 2021-02-14 DIAGNOSIS — I509 Heart failure, unspecified: Secondary | ICD-10-CM | POA: Diagnosis not present

## 2021-02-14 LAB — CBC
HCT: 32.1 % — ABNORMAL LOW (ref 36.0–46.0)
Hemoglobin: 10.2 g/dL — ABNORMAL LOW (ref 12.0–15.0)
MCH: 31 pg (ref 26.0–34.0)
MCHC: 31.8 g/dL (ref 30.0–36.0)
MCV: 97.6 fL (ref 80.0–100.0)
Platelets: 123 10*3/uL — ABNORMAL LOW (ref 150–400)
RBC: 3.29 MIL/uL — ABNORMAL LOW (ref 3.87–5.11)
RDW: 15 % (ref 11.5–15.5)
WBC: 4.5 10*3/uL (ref 4.0–10.5)
nRBC: 0 % (ref 0.0–0.2)

## 2021-02-14 LAB — BASIC METABOLIC PANEL
Anion gap: 8 (ref 5–15)
BUN: 18 mg/dL (ref 8–23)
CO2: 20 mmol/L — ABNORMAL LOW (ref 22–32)
Calcium: 8.5 mg/dL — ABNORMAL LOW (ref 8.9–10.3)
Chloride: 111 mmol/L (ref 98–111)
Creatinine, Ser: 0.81 mg/dL (ref 0.44–1.00)
GFR, Estimated: >60 mL/min (ref >60)
Glucose, Bld: 104 mg/dL — ABNORMAL HIGH (ref 70–99)
Potassium: 3.9 mmol/L (ref 3.5–5.1)
Sodium: 139 mmol/L (ref 135–145)

## 2021-02-14 LAB — MAGNESIUM: Magnesium: 2 mg/dL (ref 1.7–2.4)

## 2021-02-14 LAB — APTT: aPTT: 78 seconds — ABNORMAL HIGH (ref 24–36)

## 2021-02-14 LAB — HEPARIN LEVEL (UNFRACTIONATED): Heparin Unfractionated: >1.1 IU/mL — ABNORMAL HIGH (ref 0.30–0.70)

## 2021-02-14 MED ORDER — SODIUM CHLORIDE 0.9 % IV SOLN: INTRAVENOUS | Status: AC

## 2021-02-14 NOTE — Progress Notes (Signed)
ANTICOAGULATION CONSULT NOTE - follow up  Pharmacy Consult for Heparin (on apixaban PTA) Indication: atrial fibrillation  No Known Allergies  Patient Measurements: Height: 5\' 2"  (157.5 cm) Weight: 71.8 kg (158 lb 4.6 oz) IBW/kg (Calculated) : 50.1 Heparin Dosing Weight: 65 kg  Vital Signs: Temp: 98.4 F (36.9 C) (11/15 0325) Temp Source: Oral (11/15 0325) BP: 117/52 (11/15 0325) Pulse Rate: 62 (11/15 0325)  Labs: Recent Labs    02/12/21 1018 02/12/21 1914 02/13/21 0507 02/13/21 2302 02/14/21 0453  HGB 10.3*  --  10.0*  --  10.2*  HCT 30.8*  --  29.9*  --  32.1*  PLT 142*  --  129*  --  123*  APTT 35   < > 116* 82* 78*  LABPROT 18.0*  --   --   --   --   INR 1.5*  --   --   --   --   HEPARINUNFRC >1.10*  --  >1.10*  --  >1.10*  CREATININE 0.69  --  0.62  --  0.81   < > = values in this interval not displayed.     Estimated Creatinine Clearance: 49.7 mL/min (by C-G formula based on SCr of 0.81 mg/dL).   Medical History: Past Medical History:  Diagnosis Date   High cholesterol    Hypertension     Medications:  Infusions:   sodium chloride Stopped (02/12/21 0214)   sodium chloride 75 mL/hr at 02/13/21 2134   heparin 700 Units/hr (02/13/21 1552)    Assessment: 80 yoF presented on 11/12 PM with abdominal pain.  PMH significant for Afib on chronic apixaban anticoagulation.  Pharmacy is consulted to dose heparin for Afib while NPO.   CT shows large hiatal hernia containing stomach and colon and a question of a gastric volvulus.  No indication for emergent surgery, but surgery is following for possible procedure.    Last dose of apixaban reportedly taken 11/12 AM.   Expect baseline HL to be artificially elevated d/t recent DOAC use.  Plan to use aPTT for dosing titration until HL/aPTT correlating when DOAC is cleared in several days.    02/14/2021: Confirmatory aPTT 78, therapeutic on 700 units/hr Heparin level > 1.10 and does not correlate with the aPTT CBC-  low but relatively stable over past few days No bleeding or infusion related concerns per RN  Goal of Therapy:  Heparin level 0.3-0.7 units/ml aPTT 66-102 seconds Monitor platelets by anticoagulation protocol: Yes   Plan:  Continue  IV heparin at  700 units/hr  Daily aPTT (until heparin level correlates with the aPTT), heparin level, and CBC Continue to monitor H&H and platelets   02/16/2021, PharmD, BCPS Clinical Pharmacist [place] Please utilize Amion for appropriate phone number to reach the unit pharmacist [facility]) 02/14/2021 7:58 AM

## 2021-02-14 NOTE — Plan of Care (Signed)
  Problem: Education: Goal: Knowledge of General Education information will improve Description: Including pain rating scale, medication(s)/side effects and non-pharmacologic comfort measures Outcome: Progressing   Problem: Health Behavior/Discharge Planning: Goal: Ability to manage health-related needs will improve Outcome: Progressing   Problem: Clinical Measurements: Goal: Ability to maintain clinical measurements within normal limits will improve Outcome: Progressing Goal: Will remain free from infection Outcome: Progressing Goal: Diagnostic test results will improve Outcome: Progressing Goal: Cardiovascular complication will be avoided Outcome: Progressing   Problem: Activity: Goal: Risk for activity intolerance will decrease Outcome: Progressing   Problem: Nutrition: Goal: Adequate nutrition will be maintained Outcome: Progressing   Problem: Elimination: Goal: Will not experience complications related to bowel motility Outcome: Progressing   Problem: Pain Managment: Goal: General experience of comfort will improve Outcome: Progressing   Problem: Safety: Goal: Ability to remain free from injury will improve Outcome: Progressing   Problem: Skin Integrity: Goal: Risk for impaired skin integrity will decrease Outcome: Progressing   

## 2021-02-14 NOTE — Progress Notes (Signed)
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 Wenda Low and CT scan reviewed  Trial of clear liquid diet today, may advance to full liquid if tolerated  Discussed options for operative intervention with patient.  Could proceed with open gastrostomy tube placement during this admission.  Alternatively, if patient can tolerate liquid diet without pain, would plan consultation with Dr. Wenda Low at [facility] office and consider laparoscopic reduction of hernia, removal of hernia sac, closure of diaphragm, and fixation of stomach without gastrostomy tube.  Will give patient a trial of clear liquids today and monitor progress.  Would not advance past full liquid diet if tolerated.  I will try to contact patient's son today to discuss.        Darnell Level, MD       [facility]       Office: [phone]   Chief Complaint: Abdominal pain, gastric volvulus  Subjective: Patient up to chair, comfortable.  Hungry!  Objective: Vital signs in last 24 hours: Temp:  [98 F (36.7 C)-99.5 F (37.5 C)] 98.4 F (36.9 C) (11/15 0325) Pulse Rate:  [59-69] 62 (11/15 0325) Resp:  [12-18] 18 (11/15 0325) BP: (117-175)/(49-68) 117/52 (11/15 0325) SpO2:  [92 %-100 %] 97 % (11/15 0325) Weight:  [71.8 kg] 71.8 kg (11/14 1139) Last BM Date: 02/10/21  Intake/Output from previous day: 11/14 0701 - 11/15 0700 In: 1329.6 [I.V.:1329.6] Out: -  Intake/Output this shift: No intake/output data recorded.  Physical Exam: HEENT - sclerae clear, mucous membranes moist Neck - soft Abdomen - soft, non-tender Neuro - alert & oriented, no focal deficits  Lab Results:  Recent Labs    02/13/21 0507 02/14/21 0453  WBC 4.9 4.5  HGB 10.0* 10.2*  HCT 29.9* 32.1*  PLT 129* 123*   BMET Recent Labs    02/13/21 0507 02/14/21 0453  NA 141 139  K 3.8 3.9  CL 108 111  CO2 25 20*  GLUCOSE 77 104*  BUN 8 18   CREATININE 0.62 0.81  CALCIUM 8.5* 8.5*   PT/INR Recent Labs    02/12/21 1018  LABPROT 18.0*  INR 1.5*   Comprehensive Metabolic Panel:    Component Value Date/Time   NA 139 02/14/2021 0453   NA 141 02/13/2021 0507   K 3.9 02/14/2021 0453   K 3.8 02/13/2021 0507   CL 111 02/14/2021 0453   CL 108 02/13/2021 0507   CO2 20 (L) 02/14/2021 0453   CO2 25 02/13/2021 0507   BUN 18 02/14/2021 0453   BUN 8 02/13/2021 0507   CREATININE 0.81 02/14/2021 0453   CREATININE 0.62 02/13/2021 0507   GLUCOSE 104 (H) 02/14/2021 0453   GLUCOSE 77 02/13/2021 0507   CALCIUM 8.5 (L) 02/14/2021 0453   CALCIUM 8.5 (L) 02/13/2021 0507   AST 23 02/12/2021 1018   AST 31 02/11/2021 2017   ALT 22 02/12/2021 1018   ALT 27 02/11/2021 2017   ALKPHOS 70 02/12/2021 1018   ALKPHOS 87 02/11/2021 2017   BILITOT 0.4 02/12/2021 1018   BILITOT 0.5 02/11/2021 2017   PROT 6.0 (L) 02/12/2021 1018   PROT 7.5 02/11/2021 2017   ALBUMIN 3.4 (L) 02/12/2021 1018   ALBUMIN 4.3 02/11/2021 2017    Studies/Results: DG Abd Portable 1V  Result Date: 02/14/2021 CLINICAL DATA:  Lower abdominal pain EXAM: PORTABLE ABDOMEN - 1 VIEW COMPARISON:  CT abdomen/pelvis 02/11/2021 FINDINGS: There is a  nonobstructive bowel gas pattern in the imaged abdomen. Note that the left upper quadrant is incompletely imaged. There is no abnormal soft tissue calcification. Cholecystectomy clips are noted. There is no acute osseous abnormality. IMPRESSION: Nonobstructive bowel gas pattern in the imaged abdomen. Electronically Signed   By: Lesia Hausen M.D.   On: 02/14/2021 09:02      Darnell Level 02/14/2021   Patient ID: Danielle Blanchard, female   DOB: April 13, 1940, 80 y.o.   MRN: [id number]

## 2021-02-14 NOTE — Progress Notes (Signed)
PROGRESS NOTE    Danielle Blanchard  PVV:[id number] DOB: [birth year] DOA: 02/11/2021 PCP: Pcp, No    Chief Complaint  Patient presents with   Abdominal Pain    Brief Narrative:  Patient 80 year old female history of A. fib on chronic anticoagulation with Eliquis, SSS status post PPM, chronic diastolic CHF, hypertension, hyperlipidemia, GERD presenting with abdominal pain.  COVID-19 PCR, influenza PCR negative.  CT abdomen and pelvis with large hiatal hernia with findings concerning for gastric volvulus and obstruction.  Patient placed on bowel rest.  General surgery consulted.  GI consulted and pending.   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  No nausea or vomiting.  CT concerning for large hiatal hernia with findings concerning for gastric volvulus and obstruction. -NG tube placement recommended however resistance noted in NG tube not placed. -Patient with no further nausea or vomiting. -Eliquis on hold and patient placed on IV heparin for bridging. -Patient seen in consultation by general surgery and is felt that patient with no current acute abdomen and as such emergent surgery not needed at this time and recommending GI evaluation for possible upper endoscopy to define anatomy. -Patient seen in consultation by GI and patient underwent upper endoscopy 02/13/2021 which showed normal esophagus, suspected gastric volvulus in the cardia, in the gastric fundus, in the gastric body and in the gastric antrum, decompression done as best possible, no ischemia/gangrenous changes noted, duodenal bulb first portion of the duodenum and second portion of the duodenum were normal.  -Patient being followed by GI and  general surgery. -General surgery recommended trial of clear liquids today and would not advance past full liquid diet if tolerated.  -Per general surgery.    2.  Atrial fibrillation -Rate currently controlled.   -Patient started on clears.  -Eliquis on hold and patient on heparin for anticoagulation due to concern for possible procedure.  3.  SSS status post PPM   4.  Chronic diastolic CHF -Patient remained euvolemic.   -Monitor volume status with gentle hydration.   -IV fluids normal saline 75 cc an hour for another 24 hours.  5.  Hypertension -Blood pressure stable.   -IV hydralazine as needed.    6.  T12 compression fracture -Noted on CT and increased compared to 12/28/2020. -Patient currently asymptomatic and neurologically intact. -Symptomatic treatment. -Outpatient follow-up.  6.  Hypokalemia -Repleted.   -Potassium at 3.9.   -Follow.   DVT prophylaxis: Heparin Code Status: Full Family Communication: Updated patient.  No family at bedside. Disposition:   Status is: Inpatient  Remains inpatient appropriate because: Severity of illness       Consultants:  General surgery: Dr. Magnus Ivan 02/12/2021 GI: Dr Ewing Schlein 02/12/2021  Procedures:  CT abdomen and pelvis 02/11/2021 Upper endoscopy: Dr. Dulce Sellar 02/13/2021 Abdominal films: 02/14/2021   Antimicrobials:  None   Subjective: Sitting up in chair.  Denies any nausea, no vomiting, no abdominal pain.  States she is hungry.    Objective: Vitals:   02/13/21 1320 02/13/21 1347 02/13/21 2036 02/14/21 0325  BP: (!) 147/49 (!) 134/50 (!) 117/53 (!) 117/52  Pulse: (!) 59 60 69 62  Resp: 16 18 18 18   Temp:  99.5 F (37.5 C) 98.8 F (37.1 C) 98.4  F (36.9 C)  TempSrc:  Oral Oral Oral  SpO2: 96% 95% 94% 97%  Weight:      Height:        Intake/Output Summary (Last 24 hours) at 02/14/2021 1117 Last data filed at 02/13/2021 1600 Gross per 24 hour  Intake 1329.63 ml  Output --  Net 1329.63 ml    Filed  Weights   02/12/21 0129 02/13/21 1139  Weight: 71.8 kg 71.8 kg    Examination:  General exam: : NAD Respiratory system: CTA B anterior lung fields.  No wheezes, no rhonchi.  Speaking in full sentences.  Normal respiratory effort. Cardiovascular system: Regular rate and rhythm no murmurs rubs or gallops.  No JVD.  No lower extremity edema.  Gastrointestinal system: Abdomen soft, nontender, nondistended, positive bowel sounds.  No rebound.  No guarding. Central nervous system: Alert and oriented. No focal neurological deficits. Extremities: Symmetric 5 x 5 power. Skin: No rashes, lesions or ulcers Psychiatry: Judgement and insight appear normal. Mood & affect appropriate.   Data Reviewed: I have personally reviewed following labs and imaging studies  CBC: Recent Labs  Lab 02/11/21 2017 02/12/21 1018 02/13/21 0507 02/14/21 0453  WBC 5.8 4.4 4.9 4.5  NEUTROABS 3.9 2.8  --   --   HGB 11.6* 10.3* 10.0* 10.2*  HCT 34.5* 30.8* 29.9* 32.1*  MCV 92.7 93.6 94.3 97.6  PLT 159 142* 129* 123*     Basic Metabolic Panel: Recent Labs  Lab 02/11/21 2017 02/12/21 1018 02/13/21 0507 02/14/21 0453  NA 136 138 141 139  K 2.6* 3.5 3.8 3.9  CL 102 103 108 111  CO2 27 28 25  20*  GLUCOSE 120* 88 77 104*  BUN 17 13 8 18   CREATININE 0.95 0.69 0.62 0.81  CALCIUM 9.1 8.4* 8.5* 8.5*  MG 2.2 2.0  --  2.0     GFR: Estimated Creatinine Clearance: 49.7 mL/min (by C-G formula based on SCr of 0.81 mg/dL).  Liver Function Tests: Recent Labs  Lab 02/11/21 2017 02/12/21 1018  AST 31 23  ALT 27 22  ALKPHOS 87 70  BILITOT 0.5 0.4  PROT 7.5 6.0*  ALBUMIN 4.3 3.4*     CBG: No results for input(s): GLUCAP in the last 168 hours.   Recent Results (from the past 240 hour(s))  Resp Panel by RT-PCR (Flu A&B, Covid) Nasopharyngeal Swab     Status: None   Collection Time: 02/11/21 11:50 PM   Specimen: Nasopharyngeal Swab; Nasopharyngeal(NP) swabs in vial transport medium  Result Value Ref  Range Status   SARS Coronavirus 2 by RT PCR NEGATIVE NEGATIVE Final    Comment: (NOTE) SARS-CoV-2 target nucleic acids are NOT DETECTED.  The SARS-CoV-2 RNA is generally detectable in upper respiratory specimens during the acute phase of infection. The lowest concentration of SARS-CoV-2 viral copies this assay can detect is 138 copies/mL. A negative result does not preclude SARS-Cov-2 infection and should not be used as the sole basis for treatment or other patient management decisions. A negative result may occur with  improper specimen collection/handling, submission of specimen other than nasopharyngeal swab, presence of viral mutation(s) within the areas targeted by this assay, and inadequate number of viral copies(<138 copies/mL). A negative result must be combined with clinical observations, patient history, and epidemiological information. The expected result is Negative.  Fact Sheet for Patients:  EntrepreneurPulse.com.au  Fact Sheet for Healthcare Providers:  IncredibleEmployment.be  This test is no t yet approved or cleared by the [place] and  has been authorized for detection and/or diagnosis of SARS-CoV-2 by FDA under an Emergency Use Authorization (EUA). This EUA will remain  in effect (meaning this test can be used) for the duration of the COVID-19 declaration under Section 564(b)(1) of the Act, 21 U.S.C.section 360bbb-3(b)(1), unless the authorization is terminated  or revoked sooner.       Influenza A by PCR NEGATIVE NEGATIVE Final   Influenza B by PCR NEGATIVE NEGATIVE Final    Comment: (NOTE) The Xpert Xpress SARS-CoV-2/FLU/RSV plus assay is intended as an aid in the diagnosis of influenza from Nasopharyngeal swab specimens and should not be used as a sole basis for treatment. Nasal washings and aspirates are unacceptable for Xpert Xpress SARS-CoV-2/FLU/RSV testing.  Fact Sheet for  Patients: BloggerCourse.com  Fact Sheet for Healthcare Providers: SeriousBroker.it  This test is not yet approved or cleared by the [place] FDA and has been authorized for detection and/or diagnosis of SARS-CoV-2 by FDA under an Emergency Use Authorization (EUA). This EUA will remain in effect (meaning this test can be used) for the duration of the COVID-19 declaration under Section 564(b)(1) of the Act, 21 U.S.C. section 360bbb-3(b)(1), unless the authorization is terminated or revoked.  Performed at [facility], [address], [place], [place] [address]           Radiology Studies: DG Abd Portable 1V  Result Date: 02/14/2021 CLINICAL DATA:  Lower abdominal pain EXAM: PORTABLE ABDOMEN - 1 VIEW COMPARISON:  CT abdomen/pelvis 02/11/2021 FINDINGS: There is a nonobstructive bowel gas pattern in the imaged abdomen. Note that the left upper quadrant is incompletely imaged. There is no abnormal soft tissue calcification. Cholecystectomy clips are noted. There is no acute osseous abnormality. IMPRESSION: Nonobstructive bowel gas pattern in the imaged abdomen. Electronically Signed   By: Lesia Hausen M.D.   On: 02/14/2021 09:02        Scheduled Meds:  pantoprazole (PROTONIX) IV  40 mg Intravenous Q24H   Continuous Infusions:  sodium chloride Stopped (02/12/21 0214)   heparin 700 Units/hr (02/13/21 1552)     LOS: 2 days    Time spent: 35 minutes     Ramiro Harvest, MD [facility]   To contact the attending provider between 7A-7P or the covering provider during after hours 7P-7A, please log into the web site [email or web address] and access using universal [place] password for that web site. If you do not have the password, please call the hospital operator.  02/14/2021, 11:17 AM

## 2021-02-15 ENCOUNTER — Encounter (HOSPITAL_COMMUNITY): Payer: Self-pay | Admitting: Gastroenterology

## 2021-02-15 DIAGNOSIS — R1013 Epigastric pain: Secondary | ICD-10-CM

## 2021-02-15 DIAGNOSIS — K3189 Other diseases of stomach and duodenum: Secondary | ICD-10-CM | POA: Diagnosis not present

## 2021-02-15 LAB — CBC
HCT: 29.4 % — ABNORMAL LOW (ref 36.0–46.0)
Hemoglobin: 9.7 g/dL — ABNORMAL LOW (ref 12.0–15.0)
MCH: 31.1 pg (ref 26.0–34.0)
MCHC: 33 g/dL (ref 30.0–36.0)
MCV: 94.2 fL (ref 80.0–100.0)
Platelets: 138 10*3/uL — ABNORMAL LOW (ref 150–400)
RBC: 3.12 MIL/uL — ABNORMAL LOW (ref 3.87–5.11)
RDW: 15.9 % — ABNORMAL HIGH (ref 11.5–15.5)
WBC: 7.4 10*3/uL (ref 4.0–10.5)
nRBC: 0 % (ref 0.0–0.2)

## 2021-02-15 LAB — BASIC METABOLIC PANEL
Anion gap: 5 (ref 5–15)
BUN: 23 mg/dL (ref 8–23)
CO2: 24 mmol/L (ref 22–32)
Calcium: 8.6 mg/dL — ABNORMAL LOW (ref 8.9–10.3)
Chloride: 109 mmol/L (ref 98–111)
Creatinine, Ser: 0.74 mg/dL (ref 0.44–1.00)
GFR, Estimated: >60 mL/min (ref >60)
Glucose, Bld: 110 mg/dL — ABNORMAL HIGH (ref 70–99)
Potassium: 3.9 mmol/L (ref 3.5–5.1)
Sodium: 138 mmol/L (ref 135–145)

## 2021-02-15 LAB — HEPARIN LEVEL (UNFRACTIONATED)
Heparin Unfractionated: 0.84 IU/mL — ABNORMAL HIGH (ref 0.30–0.70)
Heparin Unfractionated: 0.78 IU/mL — ABNORMAL HIGH (ref 0.30–0.70)

## 2021-02-15 LAB — APTT
aPTT: 57 seconds — ABNORMAL HIGH (ref 24–36)
aPTT: 62 seconds — ABNORMAL HIGH (ref 24–36)
aPTT: 74 seconds — ABNORMAL HIGH (ref 24–36)

## 2021-02-15 MED ORDER — HEPARIN (PORCINE) 25000 UT/250ML-% IV SOLN
850.0000 [IU]/h | INTRAVENOUS | Status: DC
Start: 2021-02-15 — End: 2021-02-16
  Filled 2021-02-15: qty 250

## 2021-02-15 MED ORDER — ACETAMINOPHEN 650 MG RE SUPP
650.0000 mg | Freq: Once | RECTAL | Status: AC | PRN
  Administered 2021-02-15: 650 mg via RECTAL
  Filled 2021-02-15: qty 1

## 2021-02-15 MED ORDER — ENSURE ENLIVE PO LIQD
237.0000 mL | Freq: Three times a day (TID) | ORAL | Status: DC
  Administered 2021-02-15 – 2021-02-16 (×3): 237 mL via ORAL

## 2021-02-15 NOTE — Progress Notes (Signed)
ANTICOAGULATION CONSULT NOTE - follow up  Pharmacy Consult for Heparin (on apixaban PTA) Indication: atrial fibrillation  No Known Allergies  Patient Measurements: Height: 5\' 2"  (157.5 cm) Weight: 71.8 kg (158 lb 4.6 oz) IBW/kg (Calculated) : 50.1 Heparin Dosing Weight: 65 kg  Vital Signs: Temp: 97.6 F (36.4 C) (11/16 0333) Temp Source: Oral (11/16 0333) BP: 148/68 (11/16 0333) Pulse Rate: 64 (11/16 0333)  Labs: Recent Labs    02/13/21 0507 02/13/21 2302 02/14/21 0453 02/15/21 0453 02/15/21 1359  HGB 10.0*  --  10.2* 9.7*  --   HCT 29.9*  --  32.1* 29.4*  --   PLT 129*  --  123* 138*  --   APTT 116*   < > 78* 57* 62*  HEPARINUNFRC >1.10*  --  >1.10* 0.84* 0.78*  CREATININE 0.62  --  0.81 0.74  --    < > = values in this interval not displayed.     Estimated Creatinine Clearance: 50.3 mL/min (by C-G formula based on SCr of 0.74 mg/dL).   Medical History: Past Medical History:  Diagnosis Date   High cholesterol    Hypertension     Medications:  Infusions:   sodium chloride Stopped (02/12/21 0214)   sodium chloride 75 mL/hr at 02/15/21 1150   heparin 800 Units/hr (02/15/21 0618)    Assessment: 8 yoF presented on 11/12 PM with abdominal pain.  PMH significant for Afib on chronic apixaban anticoagulation.  Pharmacy is consulted to dose heparin for Afib while NPO.   CT shows large hiatal hernia containing stomach and colon and a question of a gastric volvulus.  No indication for emergent surgery, but surgery is following for possible procedure.    Last dose of apixaban reportedly taken 11/12 AM.   Expect baseline HL to be artificially elevated d/t recent DOAC use.  Plan to use aPTT for dosing titration until HL/aPTT correlating when DOAC is cleared in several days.    02/15/2021: aPTT 62, sub-therapeutic on 800 units/hr Heparin level 0.78 and does not correlate with the aPTT CBC- low but relatively stable over past few days No bleeding or infusion related  concerns per RN  Goal of Therapy:  Heparin level 0.3-0.7 units/ml aPTT 66-102 seconds Monitor platelets by anticoagulation protocol: Yes   Plan:  Increase heparin to 850 units/hr aPTT in 8 hours Daily aPTT (until heparin level correlates with the aPTT), heparin level, and CBC Continue to monitor H&H and platelets    02/17/2021, PharmD, BCPS 02/15/2021 2:43 PM

## 2021-02-15 NOTE — Progress Notes (Signed)
ANTICOAGULATION CONSULT NOTE - follow up  Pharmacy Consult for Heparin (on apixaban PTA) Indication: atrial fibrillation  No Known Allergies  Patient Measurements: Height: 5\' 2"  (157.5 cm) Weight: 71.8 kg (158 lb 4.6 oz) IBW/kg (Calculated) : 50.1 Heparin Dosing Weight: 65 kg  Vital Signs: Temp: 97.6 F (36.4 C) (11/16 0333) Temp Source: Oral (11/16 0333) BP: 148/68 (11/16 0333) Pulse Rate: 64 (11/16 0333)  Labs: Recent Labs    02/12/21 1018 02/12/21 1914 02/13/21 0507 02/13/21 2302 02/14/21 0453 02/15/21 0453  HGB 10.3*  --  10.0*  --  10.2* 9.7*  HCT 30.8*  --  29.9*  --  32.1* 29.4*  PLT 142*  --  129*  --  123* 138*  APTT 35   < > 116* 82* 78* 57*  LABPROT 18.0*  --   --   --   --   --   INR 1.5*  --   --   --   --   --   HEPARINUNFRC >1.10*  --  >1.10*  --  >1.10* 0.84*  CREATININE 0.69  --  0.62  --  0.81 0.74   < > = values in this interval not displayed.     Estimated Creatinine Clearance: 50.3 mL/min (by C-G formula based on SCr of 0.74 mg/dL).   Medical History: Past Medical History:  Diagnosis Date   High cholesterol    Hypertension     Medications:  Infusions:   sodium chloride Stopped (02/12/21 0214)   sodium chloride 75 mL/hr at 02/14/21 1924   heparin 700 Units/hr (02/15/21 0345)    Assessment: 80 yoF presented on 11/12 PM with abdominal pain.  PMH significant for Afib on chronic apixaban anticoagulation.  Pharmacy is consulted to dose heparin for Afib while NPO.   CT shows large hiatal hernia containing stomach and colon and a question of a gastric volvulus.  No indication for emergent surgery, but surgery is following for possible procedure.    Last dose of apixaban reportedly taken 11/12 AM.   Expect baseline HL to be artificially elevated d/t recent DOAC use.  Plan to use aPTT for dosing titration until HL/aPTT correlating when DOAC is cleared in several days.    02/15/2021: aPTT 57, sub-therapeutic on 700 units/hr Heparin level  0.84 and does not correlate with the aPTT CBC- low but relatively stable over past few days No bleeding or infusion related concerns per RN  Goal of Therapy:  Heparin level 0.3-0.7 units/ml aPTT 66-102 seconds Monitor platelets by anticoagulation protocol: Yes   Plan:  Increase heparin to 800 units/hr aPTT/HL in 8 hours Daily aPTT (until heparin level correlates with the aPTT), heparin level, and CBC Continue to monitor H&H and platelets   02/17/2021 RPh 02/15/2021, 6:10 AM

## 2021-02-15 NOTE — Progress Notes (Signed)
ANTICOAGULATION CONSULT NOTE - follow up  Pharmacy Consult for Heparin (on apixaban PTA) Indication: atrial fibrillation  No Known Allergies  Patient Measurements: Height: 5\' 2"  (157.5 cm) Weight: 71.8 kg (158 lb 4.6 oz) IBW/kg (Calculated) : 50.1 Heparin Dosing Weight: 65 kg  Vital Signs: Temp: 98.9 F (37.2 C) (11/16 2007) Temp Source: Axillary (11/16 2007) BP: 125/66 (11/16 2007) Pulse Rate: 63 (11/16 2007)  Labs: Recent Labs    02/13/21 0507 02/13/21 2302 02/14/21 0453 02/15/21 0453 02/15/21 1359 02/15/21 2301  HGB 10.0*  --  10.2* 9.7*  --   --   HCT 29.9*  --  32.1* 29.4*  --   --   PLT 129*  --  123* 138*  --   --   APTT 116*   < > 78* 57* 62* 74*  HEPARINUNFRC >1.10*  --  >1.10* 0.84* 0.78*  --   CREATININE 0.62  --  0.81 0.74  --   --    < > = values in this interval not displayed.     Estimated Creatinine Clearance: 50.3 mL/min (by C-G formula based on SCr of 0.74 mg/dL).   Medical History: Past Medical History:  Diagnosis Date   High cholesterol    Hypertension     Medications:  Infusions:   sodium chloride Stopped (02/12/21 0214)   heparin 850 Units/hr (02/15/21 1454)    Assessment: 46 yoF presented on 11/12 PM with abdominal pain.  PMH significant for Afib on chronic apixaban anticoagulation.  Pharmacy is consulted to dose heparin for Afib while NPO.   CT shows large hiatal hernia containing stomach and colon and a question of a gastric volvulus.  No indication for emergent surgery, but surgery is following for possible procedure.    Last dose of apixaban reportedly taken 11/12 AM.   Expect baseline HL to be artificially elevated d/t recent DOAC use.  Plan to use aPTT for dosing titration until HL/aPTT correlating when DOAC is cleared in several days.    02/15/2021: aPTT 74, therapeutic on 850 units/hr CBC- low but relatively stable over past few days No bleeding or infusion related concerns per RN  Goal of Therapy:  Heparin level  0.3-0.7 units/ml aPTT 66-102 seconds Monitor platelets by anticoagulation protocol: Yes   Plan:  Continue heparin at 850 units/hr Daily aPTT (until heparin level correlates with the aPTT), heparin level, and CBC Continue to monitor H&H and platelets    02/17/2021 RPh 02/15/2021, 11:36 PM

## 2021-02-15 NOTE — Progress Notes (Signed)
Assessment & Plan: HD#4 - hiatal hernia with gastric volvulus  EGD results reviewed, AXR 11/16 reviewed and non-obstructive  Tolerating CLD, advance to full liquid diet + ensure. If tolerates then plan for outpatient follow up with Dr. Daphine Deutscher for a minimally invasive repair. Failure to tolerate liquids would result in open gastrostomy tube this admission by Dr. Gerrit Friends. Please see his note from yesterday 11/15 for detailed discussion with patient regarding her surgical options.   Hosie Spangle, PA-C [facility] Please see Amion for pager number during day hours 7:00am-4:30pm     Chief Complaint: Abdominal pain, gastric volvulus  Subjective: Patient laying in bed. Denies pain, nausea, belching, vomiting. Tolerated CLD tray last night and water throughout the day. Had a BM yesterday. States at baseline she lives with her son and uses a walker.  Objective: Vital signs in last 24 hours: Temp:  [97.6 F (36.4 C)-98.4 F (36.9 C)] 97.6 F (36.4 C) (11/16 0333) Pulse Rate:  [59-74] 64 (11/16 0333) Resp:  [18-20] 18 (11/16 0333) BP: (118-148)/(51-68) 148/68 (11/16 0333) SpO2:  [95 %-97 %] 97 % (11/16 0333) Last BM Date: 02/10/21  Intake/Output from previous day: 11/15 0701 - 11/16 0700 In: 293.3 [P.O.:120; I.V.:173.3] Out: -  Intake/Output this shift: No intake/output data recorded.  Physical Exam: HEENT - sclerae clear, mucous membranes moist Neck - soft Abdomen - soft, non-tender Neuro - alert & oriented, no focal deficits  Lab Results:  Recent Labs    02/14/21 0453 02/15/21 0453  WBC 4.5 7.4  HGB 10.2* 9.7*  HCT 32.1* 29.4*  PLT 123* 138*   BMET Recent Labs    02/14/21 0453 02/15/21 0453  NA 139 138  K 3.9 3.9  CL 111 109  CO2 20* 24  GLUCOSE 104* 110*  BUN 18 23  CREATININE 0.81 0.74  CALCIUM 8.5* 8.6*   PT/INR Recent Labs    02/12/21 1018  LABPROT 18.0*  INR 1.5*   Comprehensive Metabolic Panel:    Component Value Date/Time    NA 138 02/15/2021 0453   NA 139 02/14/2021 0453   K 3.9 02/15/2021 0453   K 3.9 02/14/2021 0453   CL 109 02/15/2021 0453   CL 111 02/14/2021 0453   CO2 24 02/15/2021 0453   CO2 20 (L) 02/14/2021 0453   BUN 23 02/15/2021 0453   BUN 18 02/14/2021 0453   CREATININE 0.74 02/15/2021 0453   CREATININE 0.81 02/14/2021 0453   GLUCOSE 110 (H) 02/15/2021 0453   GLUCOSE 104 (H) 02/14/2021 0453   CALCIUM 8.6 (L) 02/15/2021 0453   CALCIUM 8.5 (L) 02/14/2021 0453   AST 23 02/12/2021 1018   AST 31 02/11/2021 2017   ALT 22 02/12/2021 1018   ALT 27 02/11/2021 2017   ALKPHOS 70 02/12/2021 1018   ALKPHOS 87 02/11/2021 2017   BILITOT 0.4 02/12/2021 1018   BILITOT 0.5 02/11/2021 2017   PROT 6.0 (L) 02/12/2021 1018   PROT 7.5 02/11/2021 2017   ALBUMIN 3.4 (L) 02/12/2021 1018   ALBUMIN 4.3 02/11/2021 2017    Studies/Results: DG Abd Portable 1V  Result Date: 02/14/2021 CLINICAL DATA:  Lower abdominal pain EXAM: PORTABLE ABDOMEN - 1 VIEW COMPARISON:  CT abdomen/pelvis 02/11/2021 FINDINGS: There is a nonobstructive bowel gas pattern in the imaged abdomen. Note that the left upper quadrant is incompletely imaged. There is no abnormal soft tissue calcification. Cholecystectomy clips are noted. There is no acute osseous abnormality. IMPRESSION: Nonobstructive bowel gas pattern in the imaged abdomen. Electronically Signed  By: Lesia Hausen M.D.   On: 02/14/2021 09:02      Adam Phenix 02/15/2021   Patient ID: Danielle Blanchard, female   DOB: Aug 14, 1940, 80 y.o.   MRN: [id number]

## 2021-02-15 NOTE — Care Management Important Message (Signed)
Important Message  Patient Details IM Letter placed in Patients room. Name: Danielle Blanchard MRN: [id number] Date of Birth: 1940-05-17   Medicare Important Message Given:  Yes     Caren Macadam 02/15/2021, 1:13 PM

## 2021-02-15 NOTE — Progress Notes (Signed)
PROGRESS NOTE    Danielle Blanchard  TML:[id number] DOB: 1940-09-05 DOA: 02/11/2021 PCP: Pcp, No    Brief Narrative:  80 year old female history of A. fib on chronic anticoagulation with Eliquis, SSS status post PPM, chronic diastolic CHF, hypertension, hyperlipidemia, GERD presenting with abdominal pain.  COVID-19 PCR, influenza PCR negative.  CT abdomen and pelvis with large hiatal hernia with findings concerning for gastric volvulus and obstruction.  Patient placed on bowel rest.  General surgery consulted.  GI consulted and pending.  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  No nausea or vomiting.  CT concerning for large hiatal hernia with findings concerning for gastric volvulus and obstruction. -Eliquis on hold and patient placed on IV heparin for bridging. -Patient seen in consultation by GI and patient underwent upper endoscopy 02/13/2021 which showed normal esophagus, suspected gastric volvulus in the cardia, in the gastric fundus, in the gastric body and in the gastric antrum, decompression done as best possible, no ischemia/gangrenous changes noted, duodenal bulb first portion of the duodenum and second portion of the duodenum were normal.  -General Surgery following, advancing diet as tolerated. If pt tolerates full liquids, then can d/c at that time  2.  Atrial fibrillation -Rate currently controlled.   -Patient started on clears.  -Eliquis currently remains on hold  3.  SSS status post PPM   4.  Chronic diastolic CHF -Patient remained euvolemic.   -IV fluids normal saline 75cc/hr to complete today  5.  Hypertension -Blood pressure stable.   -IV hydralazine as needed.    6.  T12  compression fracture -Noted on CT and increased compared to 12/28/2020. -Patient currently asymptomatic and neurologically intact. -Symptomatic treatment. -recommend outpt f/u  6.  Hypokalemia -Repleted.   -Potassium at 3.9.   -Follow.   DVT prophylaxis: Heparin gtt Code Status: Full Family Communication: Pt in room, family not at bedside  Status is: Inpatient  Remains inpatient appropriate because: Acuity of illness   Consultants:  GI  General Surgery  Procedures:  CT abdomen and pelvis 02/11/2021 Upper endoscopy: Dr. Dulce Sellar 02/13/2021 Abdominal films: 02/14/2021  Antimicrobials: Anti-infectives (From admission, onward)    None       Subjective: Reports tolerating clears. Denies abd pain  Objective: Vitals:   02/14/21 0325 02/14/21 1315 02/14/21 2020 02/15/21 0333  BP: (!) 117/52 (!) 118/51 123/64 (!) 148/68  Pulse: 62 (!) 59 74 64  Resp: 18 19 20 18   Temp: 98.4 F (36.9 C) 98.4 F (36.9 C) 97.8 F (36.6 C) 97.6 F (36.4 C)  TempSrc: Oral Oral Oral Oral  SpO2: 97% 95% 96% 97%  Weight:      Height:        Intake/Output Summary (Last 24 hours) at 02/15/2021 1422 Last data filed at 02/14/2021 2022 Gross per 24 hour  Intake 293.28 ml  Output --  Net 293.28 ml   Filed Weights   02/12/21 0129 02/13/21 1139  Weight: 71.8 kg 71.8 kg    Examination: General exam: Awake, laying in bed, in nad Respiratory system: Normal respiratory effort, no wheezing Cardiovascular system: regular rate, s1, s2 Gastrointestinal system: Soft, nondistended, positive BS Central nervous system: CN2-12 grossly intact, strength intact Extremities: Perfused, no clubbing Skin:  Normal skin turgor, no notable skin lesions seen Psychiatry: Mood normal // no visual hallucinations   Data Reviewed: I have personally reviewed following labs and imaging studies  CBC: Recent Labs  Lab 02/11/21 2017 02/12/21 1018 02/13/21 0507 02/14/21 0453 02/15/21 0453  WBC 5.8 4.4 4.9 4.5  7.4  NEUTROABS 3.9 2.8  --   --   --   HGB 11.6* 10.3* 10.0* 10.2* 9.7*  HCT 34.5* 30.8* 29.9* 32.1* 29.4*  MCV 92.7 93.6 94.3 97.6 94.2  PLT 159 142* 129* 123* 138*   Basic Metabolic Panel: Recent Labs  Lab 02/11/21 2017 02/12/21 1018 02/13/21 0507 02/14/21 0453 02/15/21 0453  NA 136 138 141 139 138  K 2.6* 3.5 3.8 3.9 3.9  CL 102 103 108 111 109  CO2 27 28 25  20* 24  GLUCOSE 120* 88 77 104* 110*  BUN 17 13 8 18 23   CREATININE 0.95 0.69 0.62 0.81 0.74  CALCIUM 9.1 8.4* 8.5* 8.5* 8.6*  MG 2.2 2.0  --  2.0  --    GFR: Estimated Creatinine Clearance: 50.3 mL/min (by C-G formula based on SCr of 0.74 mg/dL). Liver Function Tests: Recent Labs  Lab 02/11/21 2017 02/12/21 1018  AST 31 23  ALT 27 22  ALKPHOS 87 70  BILITOT 0.5 0.4  PROT 7.5 6.0*  ALBUMIN 4.3 3.4*   Recent Labs  Lab 02/11/21 2017  LIPASE 42   No results for input(s): AMMONIA in the last 168 hours. Coagulation Profile: Recent Labs  Lab 02/12/21 1018  INR 1.5*   Cardiac Enzymes: No results for input(s): CKTOTAL, CKMB, CKMBINDEX, TROPONINI in the last 168 hours. BNP (last 3 results) No results for input(s): PROBNP in the last 8760 hours. HbA1C: No results for input(s): HGBA1C in the last 72 hours. CBG: No results for input(s): GLUCAP in the last 168 hours. Lipid Profile: No results for input(s): CHOL, HDL, LDLCALC, TRIG, CHOLHDL, LDLDIRECT in the last 72 hours. Thyroid Function Tests: No results for input(s): TSH, T4TOTAL, FREET4, T3FREE, THYROIDAB in the last 72 hours. Anemia Panel: No results for input(s): VITAMINB12, FOLATE, FERRITIN, TIBC, IRON, RETICCTPCT in the last 72 hours. Sepsis Labs: No results for input(s): PROCALCITON, LATICACIDVEN in the last 168 hours.  Recent Results (from the past 240 hour(s))  Resp Panel by RT-PCR (Flu A&B, Covid) Nasopharyngeal Swab     Status: None   Collection Time: 02/11/21 11:50 PM   Specimen: Nasopharyngeal Swab; Nasopharyngeal(NP) swabs in vial  transport medium  Result Value Ref Range Status   SARS Coronavirus 2 by RT PCR NEGATIVE NEGATIVE Final    Comment: (NOTE) SARS-CoV-2 target nucleic acids are NOT DETECTED.  The SARS-CoV-2 RNA is generally detectable in upper respiratory specimens during the acute phase of infection. The lowest concentration of SARS-CoV-2 viral copies this assay can detect is 138 copies/mL. A negative result does not preclude SARS-Cov-2 infection and should not be used as the sole basis for treatment or other patient management decisions. A negative result may occur with  improper specimen collection/handling, submission of specimen other than nasopharyngeal swab, presence of viral mutation(s) within the areas targeted by this assay, and inadequate number of viral copies(<138 copies/mL). A negative result must be combined with clinical observations, patient history, and epidemiological information. The expected result is Negative.  Fact Sheet for Patients:  02/14/21  Fact Sheet for Healthcare Providers:  13/12/22  This test is no t yet approved or cleared by the [employer] FDA and  has been authorized for detection  and/or diagnosis of SARS-CoV-2 by FDA under an Emergency Use Authorization (EUA). This EUA will remain  in effect (meaning this test can be used) for the duration of the COVID-19 declaration under Section 564(b)(1) of the Act, 21 U.S.C.section 360bbb-3(b)(1), unless the authorization is terminated  or revoked sooner.       Influenza A by PCR NEGATIVE NEGATIVE Final   Influenza B by PCR NEGATIVE NEGATIVE Final    Comment: (NOTE) The Xpert Xpress SARS-CoV-2/FLU/RSV plus assay is intended as an aid in the diagnosis of influenza from Nasopharyngeal swab specimens and should not be used as a sole basis for treatment. Nasal washings and aspirates are unacceptable for Xpert Xpress SARS-CoV-2/FLU/RSV testing.  Fact  Sheet for Patients: [employer]  Fact Sheet for Healthcare Providers: [employer]  This test is not yet approved or cleared by the [place] FDA and has been authorized for detection and/or diagnosis of SARS-CoV-2 by FDA under an Emergency Use Authorization (EUA). This EUA will remain in effect (meaning this test can be used) for the duration of the COVID-19 declaration under Section 564(b)(1) of the Act, 21 U.S.C. section 360bbb-3(b)(1), unless the authorization is terminated or revoked.  Performed at [facility], [address] [place], [place] [address]      Radiology Studies: DG Abd Portable 1V  Result Date: 02/14/2021 CLINICAL DATA:  Lower abdominal pain EXAM: PORTABLE ABDOMEN - 1 VIEW COMPARISON:  CT abdomen/pelvis 02/11/2021 FINDINGS: There is a nonobstructive bowel gas pattern in the imaged abdomen. Note that the left upper quadrant is incompletely imaged. There is no abnormal soft tissue calcification. Cholecystectomy clips are noted. There is no acute osseous abnormality. IMPRESSION: Nonobstructive bowel gas pattern in the imaged abdomen. Electronically Signed   By: Lesia Hausen M.D.   On: 02/14/2021 09:02    Scheduled Meds:  feeding supplement  237 mL Oral TID WC   pantoprazole (PROTONIX) IV  40 mg Intravenous Q24H   Continuous Infusions:  sodium chloride Stopped (02/12/21 0214)   sodium chloride 75 mL/hr at 02/15/21 1150   heparin 800 Units/hr (02/15/21 0618)     LOS: 3 days   Rickey Barbara, MD [facility] Pager On Amion  If 7PM-7AM, please contact night-coverage 02/15/2021, 2:22 PM

## 2021-02-16 ENCOUNTER — Inpatient Hospital Stay (HOSPITAL_COMMUNITY): Payer: Medicare Other

## 2021-02-16 LAB — CBC
HCT: 29.2 % — ABNORMAL LOW (ref 36.0–46.0)
Hemoglobin: 9.5 g/dL — ABNORMAL LOW (ref 12.0–15.0)
MCH: 31.6 pg (ref 26.0–34.0)
MCHC: 32.5 g/dL (ref 30.0–36.0)
MCV: 97 fL (ref 80.0–100.0)
Platelets: 129 10*3/uL — ABNORMAL LOW (ref 150–400)
RBC: 3.01 MIL/uL — ABNORMAL LOW (ref 3.87–5.11)
RDW: 16.1 % — ABNORMAL HIGH (ref 11.5–15.5)
WBC: 6 10*3/uL (ref 4.0–10.5)
nRBC: 0 % (ref 0.0–0.2)

## 2021-02-16 LAB — HEPARIN LEVEL (UNFRACTIONATED): Heparin Unfractionated: 0.56 IU/mL (ref 0.30–0.70)

## 2021-02-16 LAB — APTT: aPTT: 70 seconds — ABNORMAL HIGH (ref 24–36)

## 2021-02-16 MED ORDER — AMOXICILLIN-POT CLAVULANATE 875-125 MG PO TABS: 1.0000 | ORAL_TABLET | Freq: Two times a day (BID) | ORAL | 0 refills | Status: AC

## 2021-02-16 MED ORDER — APIXABAN 5 MG PO TABS
5.0000 mg | ORAL_TABLET | Freq: Two times a day (BID) | ORAL | Status: DC
  Administered 2021-02-16: 11:00:00 5 mg via ORAL
  Filled 2021-02-16: qty 1

## 2021-02-16 NOTE — Discharge Summary (Addendum)
Physician Discharge Summary  Danielle Blanchard AOZ:[id number] DOB: 1941-02-01 DOA: 02/11/2021  PCP: Pcp, No  Admit date: 02/11/2021 Discharge date: 02/16/2021  Admitted From: Home Disposition:  home  Recommendations for Outpatient Follow-up:  Follow up with PCP in 1-2 weeks Follow up with General Surgery as scheduled Recommend repeat CXR in 2-3 weeks to ensure resolution of R infiltrate  Discharge Condition:Stable CODE STATUS:Full Diet recommendation: Regular   Brief/Interim Summary: 80 year old female history of A. fib on chronic anticoagulation with Eliquis, SSS status post PPM, chronic diastolic CHF, hypertension, hyperlipidemia, GERD presenting with abdominal pain.  COVID-19 PCR, influenza PCR negative.  CT abdomen and pelvis with large hiatal hernia with findings concerning for gastric volvulus and obstruction.  Patient placed on bowel rest.  General surgery consulted.  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  No nausea or vomiting.  CT concerning for large hiatal hernia with findings concerning for gastric volvulus and obstruction. -Eliquis on hold and patient placed on IV heparin for bridging. -Patient seen in consultation by GI and patient underwent upper endoscopy 02/13/2021 which showed normal esophagus, suspected gastric volvulus in the cardia, in the gastric fundus, in the gastric body and in the gastric antrum, decompression done as best possible, no ischemia/gangrenous changes noted, duodenal bulb first portion of the duodenum and second portion of the duodenum were normal.  -General Surgery following, advancing diet as tolerated. -Discussed with General Surgery. OK  for d/c today  2.  Atrial fibrillation -Rate currently controlled.   -Patient's diet resumed -Resume home meds -Eliquis resumed  3.  SSS status post PPM   4.  Chronic diastolic CHF -Patient remained euvolemic.    5.  Hypertension -Blood pressure stable.   -Cont home meds on d/c  6.  T12 compression fracture -Noted on CT and increased compared to 12/28/2020. -Patient currently asymptomatic and neurologically intact. -Symptomatic treatment. -recommend outpt f/u  6.  Hypokalemia -Repleted.   -Potassium at 3.9.   -Follow.  7. Possible Pneumonia -Towards end of this hospital stay, pt noted to have increased productive cough, noted to be tinged with blood briefly -CXR ordered and reviewed. Evidence of R sided infiltrate vs atalectasis -Empiric course of augmentin prescribed at time of d/c    Discharge Instructions   Allergies as of 02/16/2021   No Known Allergies      Medication List     STOP taking these medications    Diclofenac Sodium 2 % Soln Commonly known as: Pennsaid   methocarbamol 500 MG tablet Commonly known as: ROBAXIN       TAKE these medications    acetaminophen 325 MG tablet Commonly known as: TYLENOL Take 650 mg by mouth every 6 (six) hours as needed for mild pain, fever or headache.   amiodarone 200 MG tablet Commonly known as: PACERONE Take 200 mg by mouth daily.   amoxicillin-clavulanate 875-125 MG tablet Commonly known as: Augmentin Take 1 tablet by mouth 2 (two) times daily for 5 days.   apixaban 5 MG Tabs tablet Commonly known as: ELIQUIS Take 5 mg by mouth 2 (two) times daily.   carvedilol 3.125 MG tablet Commonly known as: COREG Take 3.125 mg by mouth 2 (two) times daily with  a meal.   hydrochlorothiazide 12.5 MG capsule Commonly known as: MICROZIDE Take 12.5 mg by mouth daily.   losartan 50 MG tablet Commonly known as: COZAAR Take 50 mg by mouth 2 (two) times daily.   Melatonin 10 MG Tabs Take 10 mg by mouth at  bedtime.   NEURIVA PO Take 1 Dose by mouth daily.   omeprazole 40 MG capsule Commonly known as: PRILOSEC Take 1 capsule (40 mg total) by mouth daily. Take 1 tablet in the morning 30 minutes before you eat.  Take daily. What changed:  when to take this reasons to take this additional instructions   rosuvastatin 20 MG tablet Commonly known as: CRESTOR Take 20 mg by mouth daily.   Systane 0.4-0.3 % Gel ophthalmic gel Generic drug: Polyethyl Glycol-Propyl Glycol Place 1 application into both eyes 3 (three) times daily as needed. What changed: reasons to take this   traMADol 50 MG tablet Commonly known as: ULTRAM Take by mouth every 6 (six) hours as needed for moderate pain.   traZODone 50 MG tablet Commonly known as: DESYREL Take 25-50 mg by mouth at bedtime as needed for sleep.        Follow-up Information     [facility] Follow up on 03/02/2021.   Specialty: General Surgery Why: 11:00am. Please arrive 30 minutes prior to your appointment for paperwork. Please bring a copy of your photo ID and insurance card. Contact information: [address] [place] [id number] [phone]         Follow up with your PCP Follow up in 2 week(s).   Why: Hospital follow up               No Known Allergies  Consultations: GI General Surgery  Procedures/Studies: CT ABDOMEN PELVIS W CONTRAST  Result Date: 02/11/2021 CLINICAL DATA:  Evaluate for bowel obstruction. Left upper quadrant abdominal pain. EXAM: CT ABDOMEN AND PELVIS WITH CONTRAST TECHNIQUE: Multidetector CT imaging of the abdomen and pelvis was performed using the standard protocol following bolus administration of intravenous contrast. CONTRAST:  80mL OMNIPAQUE IOHEXOL 350 MG/ML SOLN COMPARISON:  CT abdomen and pelvis 12/28/2020. FINDINGS: Lower chest: No acute abnormality. Hepatobiliary: No focal liver abnormality is seen. Status post cholecystectomy. No biliary dilatation. Pancreas:  Unremarkable. No pancreatic ductal dilatation or surrounding inflammatory changes. Spleen: Normal in size without focal abnormality. Adrenals/Urinary Tract: Adrenal glands are unremarkable. Kidneys are normal, without renal calculi, focal lesion, or hydronephrosis. Bladder is unremarkable. Stomach/Bowel: There is sigmoid colon diverticulosis without evidence for diverticulitis. The appendix is likely surgically absent. Small bowel and colon are nondilated. Again seen is a large hiatal hernia. The gastric body and fundus are now below the diaphragm, new finding. The gastric antrum remains within the hernia. The intrathoracic and intra-abdominal portions of the stomach are dilated with air-fluid levels. There is no pneumatosis or free air. Vascular/Lymphatic: Aortic atherosclerosis. No enlarged abdominal or pelvic lymph nodes. Reproductive: Uterus and bilateral adnexa are unremarkable. Other: No abdominal wall hernia or abnormality. There is a small fat containing left inguinal hernia. There is no ascites. Musculoskeletal: T12 inferior endplate compression fracture has increased when compared to the prior study. IMPRESSION: 1. Large hiatal hernia is again seen. In the interval, the gastric fundus has change locations and is now intraabdominal while the gastric antrum remains in the hernia. Both intrathoracic and intra-abdominal portions of the stomach are dilated with air-fluid levels. 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 Surgical consultation recommended. 2. T12 compression fracture  has increased compared to 12/28/2020. Electronically Signed   By: Ronney Asters M.D.   On: 02/11/2021 23:25   DG CHEST PORT 1 VIEW  Result Date: 02/16/2021 CLINICAL DATA:  Hemoptysis. EXAM: PORTABLE CHEST 1 VIEW COMPARISON:  Chest x-ray 12/05/2019. CT abdomen and pelvis 02/11/2021. FINDINGS: Left-sided pacemaker is again noted. The heart is enlarged, unchanged. There is a large hiatal hernia similar to the  prior study. There is a new small right pleural effusion with right lower lung dense atelectasis or airspace disease. There is no evidence for pneumothorax. No acute fractures are seen. IMPRESSION: 1. New small right pleural effusion with new dense right lower lung atelectasis/airspace disease. 2. Stable cardiomegaly. 3. Stable large hiatal hernia. Electronically Signed   By: Ronney Asters M.D.   On: 02/16/2021 15:33   DG Abd Portable 1V  Result Date: 02/14/2021 CLINICAL DATA:  Lower abdominal pain EXAM: PORTABLE ABDOMEN - 1 VIEW COMPARISON:  CT abdomen/pelvis 02/11/2021 FINDINGS: There is a nonobstructive bowel gas pattern in the imaged abdomen. Note that the left upper quadrant is incompletely imaged. There is no abnormal soft tissue calcification. Cholecystectomy clips are noted. There is no acute osseous abnormality. IMPRESSION: Nonobstructive bowel gas pattern in the imaged abdomen. Electronically Signed   By: Valetta Mole M.D.   On: 02/14/2021 09:02    Subjective: Eager to go home  Discharge Exam: Vitals:   02/16/21 0328 02/16/21 1330  BP: (!) 144/63 135/75  Pulse: 69 72  Resp: 18 20  Temp: 98 F (36.7 C) 98.3 F (36.8 C)  SpO2: 96% 98%   Vitals:   02/15/21 1500 02/15/21 2007 02/16/21 0328 02/16/21 1330  BP: 132/72 125/66 (!) 144/63 135/75  Pulse: 74 63 69 72  Resp: 19 20 18 20   Temp: 97.9 F (36.6 C) 98.9 F (37.2 C) 98 F (36.7 C) 98.3 F (36.8 C)  TempSrc: Oral Axillary Oral Oral  SpO2: 99% 97% 96% 98%  Weight:      Height:        General: Pt is alert, awake, not in acute distress Cardiovascular: RRR, S1/S2  Respiratory: CTA bilaterally, no wheezing, no rhonchi Abdominal: Soft, NT, ND, bowel sounds + Extremities: no edema, no cyanosis   The results of significant diagnostics from this hospitalization (including imaging, microbiology, ancillary and laboratory) are listed below for reference.     Microbiology: Recent Results (from the past 240 hour(s))  Resp  Panel by RT-PCR (Flu A&B, Covid) Nasopharyngeal Swab     Status: None   Collection Time: 02/11/21 11:50 PM   Specimen: Nasopharyngeal Swab; Nasopharyngeal(NP) swabs in vial transport medium  Result Value Ref Range Status   SARS Coronavirus 2 by RT PCR NEGATIVE NEGATIVE Final    Comment: (NOTE) SARS-CoV-2 target nucleic acids are NOT DETECTED.  The SARS-CoV-2 RNA is generally detectable in upper respiratory specimens during the acute phase of infection. The lowest concentration of SARS-CoV-2 viral copies this assay can detect is 138 copies/mL. A negative result does not preclude SARS-Cov-2 infection and should not be used as the sole basis for treatment or other patient management decisions. A negative result may occur with  improper specimen collection/handling, submission of specimen other than nasopharyngeal swab, presence of viral mutation(s) within the areas targeted by this assay, and inadequate number of viral copies(<138 copies/mL). A negative result must be combined with clinical observations, patient history, and epidemiological information. The expected result is Negative.  Fact Sheet for Patients:  EntrepreneurPulse.com.au  Fact Sheet for Healthcare Providers:  IncredibleEmployment.be  This test is no t yet approved or cleared by the [place] and  has been authorized for detection and/or diagnosis of SARS-CoV-2 by FDA under an Emergency Use Authorization (EUA). This EUA will remain  in effect (meaning this test can be used) for the duration of the COVID-19 declaration under Section 564(b)(1) of the Act, 21 U.S.C.section 360bbb-3(b)(1), unless the authorization is terminated  or revoked sooner.       Influenza A by PCR NEGATIVE NEGATIVE Final   Influenza B by PCR NEGATIVE NEGATIVE Final    Comment: (NOTE) The Xpert Xpress SARS-CoV-2/FLU/RSV plus assay is intended as an aid in the diagnosis of influenza from Nasopharyngeal  swab specimens and should not be used as a sole basis for treatment. Nasal washings and aspirates are unacceptable for Xpert Xpress SARS-CoV-2/FLU/RSV testing.  Fact Sheet for Patients: EntrepreneurPulse.com.au  Fact Sheet for Healthcare Providers: IncredibleEmployment.be  This test is not yet approved or cleared by the [place] FDA and has been authorized for detection and/or diagnosis of SARS-CoV-2 by FDA under an Emergency Use Authorization (EUA). This EUA will remain in effect (meaning this test can be used) for the duration of the COVID-19 declaration under Section 564(b)(1) of the Act, 21 U.S.C. section 360bbb-3(b)(1), unless the authorization is terminated or revoked.  Performed at [facility], [place] [address] [place], [place] [address]      Labs: BNP (last 3 results) No results for input(s): BNP in the last 8760 hours. Basic Metabolic Panel: Recent Labs  Lab 02/11/21 2017 02/12/21 1018 02/13/21 0507 02/14/21 0453 02/15/21 0453  NA 136 138 141 139 138  K 2.6* 3.5 3.8 3.9 3.9  CL 102 103 108 111 109  CO2 27 28 25  20* 24  GLUCOSE 120* 88 77 104* 110*  BUN 17 13 8 18 23   CREATININE 0.95 0.69 0.62 0.81 0.74  CALCIUM 9.1 8.4* 8.5* 8.5* 8.6*  MG 2.2 2.0  --  2.0  --    Liver Function Tests: Recent Labs  Lab 02/11/21 2017 02/12/21 1018  AST 31 23  ALT 27 22  ALKPHOS 87 70  BILITOT 0.5 0.4  PROT 7.5 6.0*  ALBUMIN 4.3 3.4*   Recent Labs  Lab 02/11/21 2017  LIPASE 42   No results for input(s): AMMONIA in the last 168 hours. CBC: Recent Labs  Lab 02/11/21 2017 02/12/21 1018 02/13/21 0507 02/14/21 0453 02/15/21 0453 02/16/21 0515  WBC 5.8 4.4 4.9 4.5 7.4 6.0  NEUTROABS 3.9 2.8  --   --   --   --   HGB 11.6* 10.3* 10.0* 10.2* 9.7* 9.5*  HCT 34.5* 30.8* 29.9* 32.1* 29.4* 29.2*  MCV 92.7 93.6 94.3 97.6 94.2 97.0  PLT 159 142* 129* 123* 138* 129*   Cardiac Enzymes: No results for  input(s): CKTOTAL, CKMB, CKMBINDEX, TROPONINI in the last 168 hours. BNP: Invalid input(s): POCBNP CBG: No results for input(s): GLUCAP in the last 168 hours. D-Dimer No results for input(s): DDIMER in the last 72 hours. Hgb A1c No results for input(s): HGBA1C in the last 72 hours. Lipid Profile No results for input(s): CHOL, HDL, LDLCALC, TRIG, CHOLHDL, LDLDIRECT in the last 72 hours. Thyroid function studies No results for input(s): TSH, T4TOTAL, T3FREE, THYROIDAB in the last 72 hours.  Invalid input(s): FREET3 Anemia work up No results for input(s): VITAMINB12, FOLATE, FERRITIN, TIBC, IRON, RETICCTPCT in the last 72 hours. Urinalysis    Component Value Date/Time   COLORURINE YELLOW 02/11/2021 1200   APPEARANCEUR  CLEAR 02/11/2021 1200   LABSPEC 1.015 02/11/2021 1200   PHURINE 5.0 02/11/2021 1200   GLUCOSEU NEGATIVE 02/11/2021 1200   HGBUR NEGATIVE 02/11/2021 1200   BILIRUBINUR NEGATIVE 02/11/2021 1200   KETONESUR NEGATIVE 02/11/2021 1200   PROTEINUR 30 (A) 02/11/2021 1200   NITRITE NEGATIVE 02/11/2021 1200   LEUKOCYTESUR NEGATIVE 02/11/2021 1200   Sepsis Labs Invalid input(s): PROCALCITONIN,  WBC,  LACTICIDVEN Microbiology Recent Results (from the past 240 hour(s))  Resp Panel by RT-PCR (Flu A&B, Covid) Nasopharyngeal Swab     Status: None   Collection Time: 02/11/21 11:50 PM   Specimen: Nasopharyngeal Swab; Nasopharyngeal(NP) swabs in vial transport medium  Result Value Ref Range Status   SARS Coronavirus 2 by RT PCR NEGATIVE NEGATIVE Final    Comment: (NOTE) SARS-CoV-2 target nucleic acids are NOT DETECTED.  The SARS-CoV-2 RNA is generally detectable in upper respiratory specimens during the acute phase of infection. The lowest concentration of SARS-CoV-2 viral copies this assay can detect is 138 copies/mL. A negative result does not preclude SARS-Cov-2 infection and should not be used as the sole basis for treatment or other patient management decisions. A  negative result may occur with  improper specimen collection/handling, submission of specimen other than nasopharyngeal swab, presence of viral mutation(s) within the areas targeted by this assay, and inadequate number of viral copies(<138 copies/mL). A negative result must be combined with clinical observations, patient history, and epidemiological information. The expected result is Negative.  Fact Sheet for Patients:  EntrepreneurPulse.com.au  Fact Sheet for Healthcare Providers:  IncredibleEmployment.be  This test is no t yet approved or cleared by the [place] FDA and  has been authorized for detection and/or diagnosis of SARS-CoV-2 by FDA under an Emergency Use Authorization (EUA). This EUA will remain  in effect (meaning this test can be used) for the duration of the COVID-19 declaration under Section 564(b)(1) of the Act, 21 U.S.C.section 360bbb-3(b)(1), unless the authorization is terminated  or revoked sooner.       Influenza A by PCR NEGATIVE NEGATIVE Final   Influenza B by PCR NEGATIVE NEGATIVE Final    Comment: (NOTE) The Xpert Xpress SARS-CoV-2/FLU/RSV plus assay is intended as an aid in the diagnosis of influenza from Nasopharyngeal swab specimens and should not be used as a sole basis for treatment. Nasal washings and aspirates are unacceptable for Xpert Xpress SARS-CoV-2/FLU/RSV testing.  Fact Sheet for Patients: EntrepreneurPulse.com.au  Fact Sheet for Healthcare Providers: IncredibleEmployment.be  This test is not yet approved or cleared by the [place] FDA and has been authorized for detection and/or diagnosis of SARS-CoV-2 by FDA under an Emergency Use Authorization (EUA). This EUA will remain in effect (meaning this test can be used) for the duration of the COVID-19 declaration under Section 564(b)(1) of the Act, 21 U.S.C. section 360bbb-3(b)(1), unless the authorization  is terminated or revoked.  Performed at [facility], [place] [address] [place], [place] [address]    Time spent: 30 min  SIGNED:   Marylu Lund, MD  [facility] 02/16/2021, 4:42 PM  If 7PM-7AM, please contact night-coverage

## 2021-02-16 NOTE — Discharge Instructions (Signed)
Please stay on full liquid diet until follow up with Dr. Daphine Deutscher. I would encourage boost/ensures at home to help with protein intake. If you develop any abdominal pain, vomiting, or other concerns - please call our office or seek medical evaluation.

## 2021-02-16 NOTE — Progress Notes (Addendum)
    3 Days Post-Op  Subjective: CC: Patient sitting up in bed and has finished most of her FLD tray. She reports she has been tolerating fld without n/v or abdominal pain. No PRN pain medications since yesterday morning. She is unsure of flatus but on I/O section, last BM Nov 11/16 at 0347  Objective: Vital signs in last 24 hours: Temp:  [97.9 F (36.6 C)-98.9 F (37.2 C)] 98 F (36.7 C) (11/17 0328) Pulse Rate:  [63-74] 69 (11/17 0328) Resp:  [18-20] 18 (11/17 0328) BP: (125-144)/(63-72) 144/63 (11/17 0328) SpO2:  [96 %-99 %] 96 % (11/17 0328) Last BM Date: 02/10/21 On I/O section, last BM Nov 11/16 at 0347  Intake/Output from previous day: 11/16 0701 - 11/17 0700 In: 1827.2 [I.V.:1827.2] Out: -  Intake/Output this shift: No intake/output data recorded.  PE: Gen:  Alert, NAD, pleasant Abd: Soft, ND, NT +BS Psych: A&Ox3  Skin: no rashes noted, warm and dry  Lab Results:  Recent Labs    02/15/21 0453 02/16/21 0515  WBC 7.4 6.0  HGB 9.7* 9.5*  HCT 29.4* 29.2*  PLT 138* 129*   BMET Recent Labs    02/14/21 0453 02/15/21 0453  NA 139 138  K 3.9 3.9  CL 111 109  CO2 20* 24  GLUCOSE 104* 110*  BUN 18 23  CREATININE 0.81 0.74  CALCIUM 8.5* 8.6*   PT/INR No results for input(s): LABPROT, INR in the last 72 hours. CMP     Component Value Date/Time   NA 138 02/15/2021 0453   K 3.9 02/15/2021 0453   CL 109 02/15/2021 0453   CO2 24 02/15/2021 0453   GLUCOSE 110 (H) 02/15/2021 0453   BUN 23 02/15/2021 0453   CREATININE 0.74 02/15/2021 0453   CALCIUM 8.6 (L) 02/15/2021 0453   PROT 6.0 (L) 02/12/2021 1018   ALBUMIN 3.4 (L) 02/12/2021 1018   AST 23 02/12/2021 1018   ALT 22 02/12/2021 1018   ALKPHOS 70 02/12/2021 1018   BILITOT 0.4 02/12/2021 1018   GFRNONAA >60 02/15/2021 0453   GFRAA >60 12/05/2019 2254   Lipase     Component Value Date/Time   LIPASE 42 02/11/2021 2017    Studies/Results: No results found.  Anti-infectives: Anti-infectives  (From admission, onward)    None        Assessment/Plan HD#4 - hiatal hernia with gastric volvulus - EGD results reviewed. No ischemic changes. Decompressed by GI. AXR 11/16 reviewed and non-obstructive - Please see Dr. Ardine Eng note on 11/15 for detailed discussion with patient regarding her surgical options.  - Patient is tolerating FLD without abdominal pain, n/v or tenderness on exam and had ROBF yesterday. Plan for outpatient follow up with Dr. Daphine Deutscher to discuss minimally invasive repair. She should stay on FLD until that time. Encourage boost/ensure's at home. I called and updated the patients son over the phone.  - Follow up arranged with Dr. Daphine Deutscher in [place] office. Patient and son were okay with being seen in  office. Okay for discharge from our standpoint. Will reach out to primary to let them know.   FEN - FLD, ensure VTE - SCDs, Eliquis ID - None   LOS: 4 days    Jacinto Halim , [facility] 02/16/2021, 8:42 AM Please see Amion for pager number during day hours 7:00am-4:30pm

## 2021-03-02 DIAGNOSIS — I341 Nonrheumatic mitral (valve) prolapse: Secondary | ICD-10-CM | POA: Insufficient documentation

## 2021-03-02 DIAGNOSIS — K635 Polyp of colon: Secondary | ICD-10-CM | POA: Insufficient documentation

## 2021-03-09 ENCOUNTER — Inpatient Hospital Stay (HOSPITAL_COMMUNITY)
Admission: EM | Admit: 2021-03-09 | Discharge: 2021-03-21 | DRG: 326 | Disposition: A | Discharge status: Skilled Nursing Facility | Payer: Medicare Other | Attending: Internal Medicine | Admitting: Internal Medicine

## 2021-03-09 ENCOUNTER — Emergency Department (HOSPITAL_COMMUNITY): Payer: Medicare Other

## 2021-03-09 ENCOUNTER — Encounter (HOSPITAL_COMMUNITY): Payer: Self-pay

## 2021-03-09 ENCOUNTER — Other Ambulatory Visit: Payer: Self-pay

## 2021-03-09 DIAGNOSIS — J189 Pneumonia, unspecified organism: Secondary | ICD-10-CM | POA: Diagnosis present

## 2021-03-09 DIAGNOSIS — Z950 Presence of cardiac pacemaker: Secondary | ICD-10-CM

## 2021-03-09 DIAGNOSIS — I110 Hypertensive heart disease with heart failure: Secondary | ICD-10-CM | POA: Diagnosis present

## 2021-03-09 DIAGNOSIS — R413 Other amnesia: Secondary | ICD-10-CM | POA: Diagnosis present

## 2021-03-09 DIAGNOSIS — I5032 Chronic diastolic (congestive) heart failure: Secondary | ICD-10-CM | POA: Diagnosis present

## 2021-03-09 DIAGNOSIS — Z20822 Contact with and (suspected) exposure to covid-19: Secondary | ICD-10-CM | POA: Diagnosis present

## 2021-03-09 DIAGNOSIS — R079 Chest pain, unspecified: Secondary | ICD-10-CM

## 2021-03-09 DIAGNOSIS — I495 Sick sinus syndrome: Secondary | ICD-10-CM | POA: Diagnosis present

## 2021-03-09 DIAGNOSIS — Z79890 Hormone replacement therapy (postmenopausal): Secondary | ICD-10-CM

## 2021-03-09 DIAGNOSIS — D72829 Elevated white blood cell count, unspecified: Secondary | ICD-10-CM

## 2021-03-09 DIAGNOSIS — Z66 Do not resuscitate: Secondary | ICD-10-CM | POA: Diagnosis not present

## 2021-03-09 DIAGNOSIS — I480 Paroxysmal atrial fibrillation: Secondary | ICD-10-CM | POA: Diagnosis present

## 2021-03-09 DIAGNOSIS — K440 Diaphragmatic hernia with obstruction, without gangrene: Secondary | ICD-10-CM | POA: Diagnosis not present

## 2021-03-09 DIAGNOSIS — K562 Volvulus: Secondary | ICD-10-CM

## 2021-03-09 DIAGNOSIS — Z79899 Other long term (current) drug therapy: Secondary | ICD-10-CM

## 2021-03-09 DIAGNOSIS — E876 Hypokalemia: Secondary | ICD-10-CM | POA: Diagnosis present

## 2021-03-09 DIAGNOSIS — I255 Ischemic cardiomyopathy: Secondary | ICD-10-CM | POA: Diagnosis present

## 2021-03-09 DIAGNOSIS — I4891 Unspecified atrial fibrillation: Secondary | ICD-10-CM | POA: Diagnosis present

## 2021-03-09 DIAGNOSIS — K219 Gastro-esophageal reflux disease without esophagitis: Secondary | ICD-10-CM | POA: Diagnosis present

## 2021-03-09 DIAGNOSIS — K3189 Other diseases of stomach and duodenum: Secondary | ICD-10-CM | POA: Diagnosis present

## 2021-03-09 DIAGNOSIS — K449 Diaphragmatic hernia without obstruction or gangrene: Secondary | ICD-10-CM

## 2021-03-09 DIAGNOSIS — I429 Cardiomyopathy, unspecified: Secondary | ICD-10-CM

## 2021-03-09 DIAGNOSIS — Z7901 Long term (current) use of anticoagulants: Secondary | ICD-10-CM

## 2021-03-09 DIAGNOSIS — Z28310 Unvaccinated for covid-19: Secondary | ICD-10-CM

## 2021-03-09 DIAGNOSIS — I10 Essential (primary) hypertension: Secondary | ICD-10-CM | POA: Diagnosis present

## 2021-03-09 DIAGNOSIS — K5900 Constipation, unspecified: Secondary | ICD-10-CM

## 2021-03-09 DIAGNOSIS — I509 Heart failure, unspecified: Secondary | ICD-10-CM

## 2021-03-09 DIAGNOSIS — E7800 Pure hypercholesterolemia, unspecified: Secondary | ICD-10-CM | POA: Diagnosis present

## 2021-03-09 LAB — COMPREHENSIVE METABOLIC PANEL
ALT: 52 U/L — ABNORMAL HIGH (ref 0–44)
AST: 50 U/L — ABNORMAL HIGH (ref 15–41)
Albumin: 3.5 g/dL (ref 3.5–5.0)
Alkaline Phosphatase: 72 U/L (ref 38–126)
Anion gap: 8 (ref 5–15)
BUN: 19 mg/dL (ref 8–23)
CO2: 30 mmol/L (ref 22–32)
Calcium: 9.1 mg/dL (ref 8.9–10.3)
Chloride: 97 mmol/L — ABNORMAL LOW (ref 98–111)
Creatinine, Ser: 0.98 mg/dL (ref 0.44–1.00)
GFR, Estimated: 58 mL/min — ABNORMAL LOW (ref >60)
Glucose, Bld: 137 mg/dL — ABNORMAL HIGH (ref 70–99)
Potassium: 3.1 mmol/L — ABNORMAL LOW (ref 3.5–5.1)
Sodium: 135 mmol/L (ref 135–145)
Total Bilirubin: 0.8 mg/dL (ref 0.3–1.2)
Total Protein: 6.4 g/dL — ABNORMAL LOW (ref 6.5–8.1)

## 2021-03-09 LAB — CBC WITH DIFFERENTIAL/PLATELET
Abs Immature Granulocytes: 0.02 10*3/uL (ref 0.00–0.07)
Basophils Absolute: 0.1 10*3/uL (ref 0.0–0.1)
Basophils Relative: 1 %
Eosinophils Absolute: 0.2 10*3/uL (ref 0.0–0.5)
Eosinophils Relative: 4 %
HCT: 31.9 % — ABNORMAL LOW (ref 36.0–46.0)
Hemoglobin: 10.4 g/dL — ABNORMAL LOW (ref 12.0–15.0)
Immature Granulocytes: 0 %
Lymphocytes Relative: 21 %
Lymphs Abs: 1.2 10*3/uL (ref 0.7–4.0)
MCH: 31.3 pg (ref 26.0–34.0)
MCHC: 32.6 g/dL (ref 30.0–36.0)
MCV: 96.1 fL (ref 80.0–100.0)
Monocytes Absolute: 0.6 10*3/uL (ref 0.1–1.0)
Monocytes Relative: 10 %
Neutro Abs: 3.8 10*3/uL (ref 1.7–7.7)
Neutrophils Relative %: 64 %
Platelets: 189 10*3/uL (ref 150–400)
RBC: 3.32 MIL/uL — ABNORMAL LOW (ref 3.87–5.11)
RDW: 15.3 % (ref 11.5–15.5)
WBC: 5.9 10*3/uL (ref 4.0–10.5)
nRBC: 0 % (ref 0.0–0.2)

## 2021-03-09 LAB — TROPONIN I (HIGH SENSITIVITY): Troponin I (High Sensitivity): 8 ng/L (ref <18)

## 2021-03-09 NOTE — ED Provider Notes (Signed)
[facility] EMERGENCY DEPARTMENT Provider Note   CSN: [id number] Arrival date & time: 03/09/21  2213     History Chief Complaint  Patient presents with   Chest Pain   Abdominal Pain    Danielle Blanchard is a 80 y.o. female.  80 y/o female with hx of Afib (on chronic Eliquis), SSS s/p PPM, chronic dCHF, HTN, HLD, GERD presents to the emergency department for evaluation of chest pain.  Patient actually reports that her chest pain has been ongoing since yesterday.  It has been constant, but waxing and waning in severity.  Pain gradually worsening into the evening tonight.  She has difficult time characterizing her discomfort, but notes that it is in her left chest and travels to the left side of her neck as well as her left shoulder.  She is having some discomfort in her left lower back.  She has not taken any medications for her symptoms nor has she noted any aggravating or alleviating factors.  Was given 324 mg aspirin and 1 sublingual nitroglycerin prior to arrival during transport with EMS.  She denies fevers, nausea, vomiting, syncope, bowel changes.  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 Decompressed during endoscopy by GI. Discharged w/outpatient [facility] f/u with plan for minimally invasive repair in future.  The history is provided by the patient. No language interpreter was used.  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     History reviewed. No pertinent family history.  Social History   Tobacco Use   Smoking status: Never   Smokeless tobacco: Never  Vaping Use   Vaping Use: Never used  Substance Use Topics   Alcohol use: Never   Drug use: Never    Home Medications Prior to Admission medications   Medication Sig Start Date End Date Taking? Authorizing Provider  acetaminophen (TYLENOL) 325 MG tablet Take 650 mg by mouth every 6 (six) hours as needed for mild pain, fever or headache.    [provider]  amiodarone (PACERONE) 200 MG tablet Take 200 mg by mouth daily.    [provider]  apixaban (ELIQUIS) 5 MG TABS tablet Take 5 mg by mouth 2 (two) times daily.    [provider]  carvedilol (COREG) 3.125 MG tablet Take 3.125 mg by mouth 2 (two) times daily with a meal.    [provider]  hydrochlorothiazide (MICROZIDE) 12.5 MG capsule Take 12.5 mg by mouth daily.    [provider]  losartan (COZAAR) 50 MG tablet Take 50 mg by mouth 2 (two) times daily.    [provider]  Melatonin 10 MG TABS Take 10 mg by mouth at bedtime.  [provider]  Misc Natural Products (NEURIVA PO) Take 1 Dose by mouth daily.    [provider]  omeprazole (PRILOSEC) 40 MG capsule Take 1 capsule (40 mg total) by mouth daily. Take 1 tablet in the morning 30 minutes before you eat.  Take daily. Patient taking differently: Take 40 mg by mouth daily as needed (stomach acid). 12/06/19   Arby Barrette, MD  Polyethyl Glycol-Propyl Glycol (SYSTANE) 0.4-0.3 % GEL ophthalmic gel Place 1 application into both eyes 3 (three) times daily as needed. Patient taking differently: Place 1 application into both eyes 3  (three) times daily as needed (for dry eyes). 02/15/20   Cathie Hoops, Amy V, PA-C  rosuvastatin (CRESTOR) 20 MG tablet Take 20 mg by mouth daily.    [provider]  traMADol (ULTRAM) 50 MG tablet Take by mouth every 6 (six) hours as needed for moderate pain.    [provider]  traZODone (DESYREL) 50 MG tablet Take 25-50 mg by mouth at bedtime as needed for sleep.    [provider]    Allergies    Patient has no known allergies.  Review of Systems   Review of Systems  Cardiovascular:  Positive for chest pain.  Gastrointestinal:  Positive for abdominal pain.  Ten systems reviewed and are negative for acute change, except as noted in the HPI.    Physical Exam Updated Vital Signs BP (!) 122/48   Pulse 65   Temp 98.6 F (37 C) (Oral)   Resp 16   Ht 5\' 2"  (1.575 m)   Wt 71.8 kg   SpO2 98%   BMI 28.95 kg/m   Physical Exam Vitals and nursing note reviewed.  Constitutional:      General: She is not in acute distress.    Appearance: She is well-developed. She is not diaphoretic.     Comments: Appears uncomfortable. Continually rubbing L chest and L shoulder. Nontoxic.  HENT:     Head: Normocephalic and atraumatic.  Eyes:     General: No scleral icterus.    Conjunctiva/sclera: Conjunctivae normal.  Cardiovascular:     Rate and Rhythm: Normal rate and regular rhythm.     Pulses: Normal pulses.  Pulmonary:     Effort: Pulmonary effort is normal. No respiratory distress.     Breath sounds: No stridor. No wheezing.     Comments: Respirations even and unlabored. Lungs CTAB. Abdominal:     Palpations: Abdomen is soft. There is no mass.     Tenderness: There is abdominal tenderness.     Comments: Soft, nondistended abdomen. TTP in the epigastrium. No peritoneal signs.  Musculoskeletal:        General: Normal range of motion.     Cervical back: Normal range of motion.  Skin:    General: Skin is warm and dry.     Coloration: Skin is not pale.     Findings: No  erythema or rash.  Neurological:     Mental Status: She is alert and oriented to person, place, and time.     Coordination: Coordination normal.  Psychiatric:        Behavior: Behavior normal.    ED Results / Procedures / Treatments   Labs (all labs ordered are listed, but only abnormal results are displayed) Labs Reviewed  CBC WITH DIFFERENTIAL/PLATELET - Abnormal; Notable for the following components:      Result Value   RBC 3.32 (*)    Hemoglobin 10.4 (*)    HCT 31.9 (*)  All other components within normal limits  COMPREHENSIVE METABOLIC PANEL - Abnormal; Notable for the following components:   Potassium 3.1 (*)    Chloride 97 (*)    Glucose, Bld 137 (*)    Total Protein 6.4 (*)    AST 50 (*)    ALT 52 (*)    GFR, Estimated 58 (*)    All other components within normal limits  I-STAT CHEM 8, ED  TROPONIN I (HIGH SENSITIVITY)    EKG EKG Interpretation  Date/Time:  Thursday March 09 2021 22:19:27 EST Ventricular Rate:  62 PR Interval:  215 QRS Duration: 105 QT Interval:  484 QTC Calculation: 492 R Axis:   59 Text Interpretation: Sinus or ectopic atrial rhythm Borderline prolonged PR interval Low voltage, precordial leads RSR' in V1 or V2, probably normal variant Borderline repolarization abnormality Borderline prolonged QT interval No STEMI Similar EKG compared to prior Confirmed by Ernie Avena ([id number]) on 03/09/2021 11:38:12 PM  Radiology DG Chest Port 1 View  Result Date: 03/09/2021 CLINICAL DATA:  Chest pain EXAM: PORTABLE CHEST 1 VIEW COMPARISON:  02/16/2021 FINDINGS: Cardiac shadow is stable. Pacing device is again noted. Large hiatal hernia is again seen. Lungs are clear bilaterally. No bony abnormality is noted. IMPRESSION: Stable large hiatal hernia.  No new focal abnormality is noted. Electronically Signed   By: Alcide Clever M.D.   On: 03/09/2021 23:17    Procedures Procedures   Medications Ordered in ED Medications  fentaNYL (SUBLIMAZE) injection 50  mcg (has no administration in time range)    ED Course  I have reviewed the triage vital signs and the nursing notes.  Pertinent labs & imaging results that were available during my care of the patient were reviewed by me and considered in my medical decision making (see chart for details).    MDM Rules/Calculators/A&P                           80 year old female presents to the emergency department for evaluation of left-sided chest pain which has been constant since yesterday.  History of recent hospitalization for a large hiatal hernia complicated by gastric volvulus and obstruction.  This was decompressed under endoscopy by GI.  Patient appears to have stable large hiatal hernia on portable chest x-ray.  Initial laboratory evaluation and vital signs have been reassuring.  She is pending CT of her chest, abdomen, pelvis to further investigate cause of her discomfort; specifically, aim to exclude recurrent volvulus and obstruction, possible perforation.  Fentanyl given for pain control.  Care signed out to [facility], PA-C at change of shift.   Final Clinical Impression(s) / ED Diagnoses Final diagnoses:  Chest pain, unspecified type    Rx / DC Orders ED Discharge Orders     None        Antony Madura, PA-C 03/10/21 0003    Dione Booze, MD 03/10/21 [phone]

## 2021-03-09 NOTE — ED Provider Notes (Signed)
80 year old female with chest discomfort. History of hiatal hernia, plan fore repair. CP onset yesterday which radiates to neck and shoulder with abdominal discomfort.  Plan for CT chest/abd/pelvis due to hernia concern with CP.  Repeat trop. Physical Exam  BP (!) 100/45   Pulse 66   Temp 98.6 F (37 C) (Oral)   Resp 15   Ht 5\' 2"  (1.575 m)   Wt 71.8 kg   SpO2 97%   BMI 28.95 kg/m   Physical Exam  ED Course/Procedures     Procedures  MDM  CT concerning for volvulus of patient's large hiatal hernia. Discussed patient with Dr. , ER attending, chart reviewed.  Consult to Dr. Preston Fleeting with general surgery who agrees with plan for general surgery to consult, hospitalist to admit.  We will also page GI.  Case discussed with Dr. Sheliah Hatch with [facility] hospitalist service will consult for admission.  Pending consult to GI to discuss NG tube.  Case discussed with Dr. Gloris Ham with [facility], recommends NG tube only if patient vomits again.  Morning team will see patient.      Deboraha Sprang, PA-C 03/10/21 0344    14/09/22, MD 03/10/21 [phone]

## 2021-03-09 NOTE — ED Triage Notes (Signed)
Pt BIB EMS from home. Pt has a hernia and is due for surgery but has not been scheduled. Pt reports constant discomfort. Per pt woke up this morning with CP, ran some errands, and then started having abdominal pain and left neck pain. Per EMS pt on liquid diet but reports having solid foods today.  Pt has a pacemaker  12 lead unremarkable.  EMS gave 324 of aspirin and 1 nitro - BP 105/64

## 2021-03-10 ENCOUNTER — Emergency Department (HOSPITAL_COMMUNITY): Payer: Medicare Other

## 2021-03-10 ENCOUNTER — Inpatient Hospital Stay (HOSPITAL_COMMUNITY): Payer: Medicare Other

## 2021-03-10 DIAGNOSIS — J189 Pneumonia, unspecified organism: Secondary | ICD-10-CM | POA: Diagnosis present

## 2021-03-10 DIAGNOSIS — E876 Hypokalemia: Secondary | ICD-10-CM | POA: Diagnosis present

## 2021-03-10 DIAGNOSIS — K3189 Other diseases of stomach and duodenum: Secondary | ICD-10-CM | POA: Diagnosis present

## 2021-03-10 DIAGNOSIS — Z28310 Unvaccinated for covid-19: Secondary | ICD-10-CM | POA: Diagnosis not present

## 2021-03-10 DIAGNOSIS — I255 Ischemic cardiomyopathy: Secondary | ICD-10-CM | POA: Diagnosis present

## 2021-03-10 DIAGNOSIS — Z950 Presence of cardiac pacemaker: Secondary | ICD-10-CM | POA: Diagnosis not present

## 2021-03-10 DIAGNOSIS — K440 Diaphragmatic hernia with obstruction, without gangrene: Secondary | ICD-10-CM | POA: Diagnosis present

## 2021-03-10 DIAGNOSIS — K219 Gastro-esophageal reflux disease without esophagitis: Secondary | ICD-10-CM | POA: Diagnosis present

## 2021-03-10 DIAGNOSIS — Z79890 Hormone replacement therapy (postmenopausal): Secondary | ICD-10-CM | POA: Diagnosis not present

## 2021-03-10 DIAGNOSIS — I110 Hypertensive heart disease with heart failure: Secondary | ICD-10-CM | POA: Diagnosis present

## 2021-03-10 DIAGNOSIS — I480 Paroxysmal atrial fibrillation: Secondary | ICD-10-CM | POA: Diagnosis present

## 2021-03-10 DIAGNOSIS — Z20822 Contact with and (suspected) exposure to covid-19: Secondary | ICD-10-CM | POA: Diagnosis present

## 2021-03-10 DIAGNOSIS — Z66 Do not resuscitate: Secondary | ICD-10-CM | POA: Diagnosis not present

## 2021-03-10 DIAGNOSIS — Z01810 Encounter for preprocedural cardiovascular examination: Secondary | ICD-10-CM | POA: Diagnosis not present

## 2021-03-10 DIAGNOSIS — Z7901 Long term (current) use of anticoagulants: Secondary | ICD-10-CM | POA: Diagnosis not present

## 2021-03-10 DIAGNOSIS — K562 Volvulus: Secondary | ICD-10-CM | POA: Diagnosis present

## 2021-03-10 DIAGNOSIS — Z79899 Other long term (current) drug therapy: Secondary | ICD-10-CM | POA: Diagnosis not present

## 2021-03-10 DIAGNOSIS — R079 Chest pain, unspecified: Secondary | ICD-10-CM | POA: Diagnosis present

## 2021-03-10 DIAGNOSIS — I5032 Chronic diastolic (congestive) heart failure: Secondary | ICD-10-CM | POA: Diagnosis present

## 2021-03-10 DIAGNOSIS — E7800 Pure hypercholesterolemia, unspecified: Secondary | ICD-10-CM | POA: Diagnosis present

## 2021-03-10 DIAGNOSIS — I495 Sick sinus syndrome: Secondary | ICD-10-CM | POA: Diagnosis present

## 2021-03-10 LAB — ECHOCARDIOGRAM COMPLETE
AR max vel: 1.88 cm2
AV Area VTI: 1.89 cm2
AV Area mean vel: 1.74 cm2
AV Mean grad: 4 mmHg
AV Peak grad: 6.7 mmHg
Ao pk vel: 1.29 m/s
Area-P 1/2: 3.36 cm2
Calc EF: 60.9 %
Height: 62 in
MV VTI: 1.52 cm2
P 1/2 time: 501 msec
Radius: 0.6 cm
S' Lateral: 3.2 cm
Single Plane A2C EF: 58.2 %
Single Plane A4C EF: 63 %
Weight: 2532.64 oz

## 2021-03-10 LAB — BASIC METABOLIC PANEL
Anion gap: 11 (ref 5–15)
BUN: 19 mg/dL (ref 8–23)
CO2: 31 mmol/L (ref 22–32)
Calcium: 8.9 mg/dL (ref 8.9–10.3)
Chloride: 95 mmol/L — ABNORMAL LOW (ref 98–111)
Creatinine, Ser: 0.96 mg/dL (ref 0.44–1.00)
GFR, Estimated: 59 mL/min — ABNORMAL LOW (ref >60)
Glucose, Bld: 124 mg/dL — ABNORMAL HIGH (ref 70–99)
Potassium: 2.8 mmol/L — ABNORMAL LOW (ref 3.5–5.1)
Sodium: 137 mmol/L (ref 135–145)

## 2021-03-10 LAB — RESP PANEL BY RT-PCR (FLU A&B, COVID) ARPGX2
Influenza A by PCR: NEGATIVE
Influenza B by PCR: NEGATIVE
SARS Coronavirus 2 by RT PCR: NEGATIVE

## 2021-03-10 LAB — HEPARIN LEVEL (UNFRACTIONATED)
Heparin Unfractionated: >1.1 IU/mL — ABNORMAL HIGH (ref 0.30–0.70)
Heparin Unfractionated: >1.1 IU/mL — ABNORMAL HIGH (ref 0.30–0.70)

## 2021-03-10 LAB — ABO/RH: ABO/RH(D): O POS

## 2021-03-10 LAB — TYPE AND SCREEN
ABO/RH(D): O POS
Antibody Screen: NEGATIVE

## 2021-03-10 LAB — MAGNESIUM: Magnesium: 2.1 mg/dL (ref 1.7–2.4)

## 2021-03-10 LAB — APTT
aPTT: >200 seconds (ref 24–36)
aPTT: >200 seconds (ref 24–36)

## 2021-03-10 MED ORDER — ONDANSETRON HCL 4 MG/2ML IJ SOLN
4.0000 mg | Freq: Once | INTRAMUSCULAR | Status: AC
  Administered 2021-03-10: 4 mg via INTRAVENOUS
  Filled 2021-03-10: qty 2

## 2021-03-10 MED ORDER — POTASSIUM CHLORIDE 10 MEQ/100ML IV SOLN
10.0000 meq | INTRAVENOUS | Status: AC
  Administered 2021-03-10 (×2): 10 meq via INTRAVENOUS
  Filled 2021-03-10 (×2): qty 100

## 2021-03-10 MED ORDER — IOHEXOL 300 MG/ML  SOLN
100.0000 mL | Freq: Once | INTRAMUSCULAR | Status: AC | PRN
  Administered 2021-03-10: 100 mL via INTRAVENOUS

## 2021-03-10 MED ORDER — LEVOTHYROXINE SODIUM 75 MCG PO TABS
75.0000 ug | ORAL_TABLET | Freq: Every day | ORAL | Status: DC
  Administered 2021-03-10 – 2021-03-21 (×12): 75 ug via ORAL
  Filled 2021-03-10 (×12): qty 1

## 2021-03-10 MED ORDER — ROSUVASTATIN CALCIUM 20 MG PO TABS
20.0000 mg | ORAL_TABLET | Freq: Every day | ORAL | Status: DC
  Administered 2021-03-10 – 2021-03-21 (×11): 20 mg via ORAL
  Filled 2021-03-10 (×11): qty 1

## 2021-03-10 MED ORDER — SODIUM CHLORIDE 0.9 % IV SOLN: INTRAVENOUS | Status: DC

## 2021-03-10 MED ORDER — HYDRALAZINE HCL 20 MG/ML IJ SOLN: 5.0000 mg | INTRAMUSCULAR | Status: DC | PRN

## 2021-03-10 MED ORDER — MORPHINE SULFATE (PF) 2 MG/ML IV SOLN
1.0000 mg | INTRAVENOUS | Status: DC | PRN
  Administered 2021-03-13 – 2021-03-15 (×5): 1 mg via INTRAVENOUS
  Filled 2021-03-10 (×5): qty 1

## 2021-03-10 MED ORDER — AMIODARONE HCL 200 MG PO TABS
200.0000 mg | ORAL_TABLET | Freq: Every day | ORAL | Status: DC
  Administered 2021-03-10 – 2021-03-21 (×11): 200 mg via ORAL
  Filled 2021-03-10 (×11): qty 1

## 2021-03-10 MED ORDER — HEPARIN (PORCINE) 25000 UT/250ML-% IV SOLN
900.0000 [IU]/h | INTRAVENOUS | Status: DC
  Administered 2021-03-10: 900 [IU]/h via INTRAVENOUS
  Filled 2021-03-10: qty 250

## 2021-03-10 MED ORDER — HEPARIN SODIUM (PORCINE) 5000 UNIT/ML IJ SOLN
5000.0000 [IU] | Freq: Three times a day (TID) | INTRAMUSCULAR | Status: DC
  Administered 2021-03-10: 5000 [IU] via SUBCUTANEOUS
  Filled 2021-03-10: qty 1

## 2021-03-10 MED ORDER — FENTANYL CITRATE PF 50 MCG/ML IJ SOSY
50.0000 ug | PREFILLED_SYRINGE | Freq: Once | INTRAMUSCULAR | Status: AC
  Administered 2021-03-10: 50 ug via INTRAVENOUS
  Filled 2021-03-10: qty 1

## 2021-03-10 MED ORDER — METOPROLOL TARTRATE 5 MG/5ML IV SOLN: 5.0000 mg | Freq: Three times a day (TID) | INTRAVENOUS | Status: DC | PRN

## 2021-03-10 MED ORDER — SODIUM CHLORIDE 0.9 % IV BOLUS
500.0000 mL | Freq: Once | INTRAVENOUS | Status: AC
  Administered 2021-03-10: 500 mL via INTRAVENOUS

## 2021-03-10 MED ORDER — POTASSIUM CHLORIDE 10 MEQ/100ML IV SOLN
10.0000 meq | INTRAVENOUS | Status: DC
  Administered 2021-03-10 (×2): 10 meq via INTRAVENOUS
  Filled 2021-03-10 (×2): qty 100

## 2021-03-10 MED ORDER — LACTATED RINGERS IV SOLN: INTRAVENOUS | Status: DC

## 2021-03-10 MED ORDER — CARVEDILOL 3.125 MG PO TABS
3.1250 mg | ORAL_TABLET | Freq: Two times a day (BID) | ORAL | Status: DC
  Administered 2021-03-10 – 2021-03-21 (×22): 3.125 mg via ORAL
  Filled 2021-03-10 (×23): qty 1

## 2021-03-10 MED ORDER — POTASSIUM CHLORIDE CRYS ER 20 MEQ PO TBCR
20.0000 meq | EXTENDED_RELEASE_TABLET | Freq: Once | ORAL | Status: AC
  Administered 2021-03-10: 20 meq via ORAL
  Filled 2021-03-10: qty 1

## 2021-03-10 MED ORDER — HYDRALAZINE HCL 20 MG/ML IJ SOLN
10.0000 mg | Freq: Four times a day (QID) | INTRAMUSCULAR | Status: DC | PRN
  Administered 2021-03-14: 10 mg via INTRAVENOUS
  Filled 2021-03-10: qty 1

## 2021-03-10 MED ORDER — POTASSIUM CHLORIDE 2 MEQ/ML IV SOLN
INTRAVENOUS | Status: AC
  Filled 2021-03-10 (×2): qty 1000

## 2021-03-10 MED ORDER — SODIUM CHLORIDE 0.9 % IV SOLN: 12.5000 mg | Freq: Four times a day (QID) | INTRAVENOUS | Status: DC | PRN

## 2021-03-10 MED ORDER — HEPARIN (PORCINE) 25000 UT/250ML-% IV SOLN
850.0000 [IU]/h | INTRAVENOUS | Status: DC
  Administered 2021-03-11: 700 [IU]/h via INTRAVENOUS
  Administered 2021-03-13: 850 [IU]/h via INTRAVENOUS
  Filled 2021-03-10 (×2): qty 250

## 2021-03-10 MED ORDER — PANTOPRAZOLE SODIUM 40 MG IV SOLR
40.0000 mg | INTRAVENOUS | Status: DC
  Administered 2021-03-10 – 2021-03-15 (×5): 40 mg via INTRAVENOUS
  Filled 2021-03-10 (×5): qty 40

## 2021-03-10 NOTE — Progress Notes (Signed)
Chief Complaint/Subjective: Vomited yesterday, some abdominal discomfort. She does not currently feel nauseated  Review of Systems See above, otherwise other systems negative  PMH -  has a past medical history of High cholesterol and Hypertension. PSH -  has a past surgical history that includes pacemaker placement and Esophagogastroduodenoscopy (egd) with propofol (N/A, 02/13/2021).  [facility] - family history is not on file.   Objective: Vital signs in last 24 hours: Temp:  [98.6 F (37 C)] 98.6 F (37 C) (12/08 2221) Pulse Rate:  [59-81] 60 (12/09 0615) Resp:  [12-23] 14 (12/09 0615) BP: (88-148)/(45-77) 106/64 (12/09 0615) SpO2:  [90 %-99 %] 95 % (12/09 0615) Weight:  [71.8 kg] 71.8 kg (12/08 2224)   Intake/Output from previous day: No intake/output data recorded. Intake/Output this shift: No intake/output data recorded.  PE: Gen: NAD Resp: nonlabored Card: bradycardic Abd: soft, NT, ND Neuro: poor historian, AOx3  Lab Results:  Recent Labs    03/09/21 2223  WBC 5.9  HGB 10.4*  HCT 31.9*  PLT 189   BMET Recent Labs    03/09/21 2223 03/10/21 0401  NA 135 137  K 3.1* 2.8*  CL 97* 95*  CO2 30 31  GLUCOSE 137* 124*  BUN 19 19  CREATININE 0.98 0.96  CALCIUM 9.1 8.9   PT/INR No results for input(s): LABPROT, INR in the last 72 hours. CMP     Component Value Date/Time   NA 137 03/10/2021 0401   K 2.8 (L) 03/10/2021 0401   CL 95 (L) 03/10/2021 0401   CO2 31 03/10/2021 0401   GLUCOSE 124 (H) 03/10/2021 0401   BUN 19 03/10/2021 0401   CREATININE 0.96 03/10/2021 0401   CALCIUM 8.9 03/10/2021 0401   PROT 6.4 (L) 03/09/2021 2223   ALBUMIN 3.5 03/09/2021 2223   AST 50 (H) 03/09/2021 2223   ALT 52 (H) 03/09/2021 2223   ALKPHOS 72 03/09/2021 2223   BILITOT 0.8 03/09/2021 2223   GFRNONAA 59 (L) 03/10/2021 0401   GFRAA >60 12/05/2019 2254   Lipase     Component Value Date/Time   LIPASE 42 02/11/2021 2017    Studies/Results: CT CHEST ABDOMEN  PELVIS W CONTRAST  Result Date: 03/10/2021 CLINICAL DATA:  Chest pain.  History of hernia. EXAM: CT CHEST, ABDOMEN, AND PELVIS WITH CONTRAST TECHNIQUE: Multidetector CT imaging of the chest, abdomen and pelvis was performed following the standard protocol during bolus administration of intravenous contrast. CONTRAST:  OMNIPAQUE IOHEXOL 300 MG/ML  SOLN COMPARISON:  02/11/2021. FINDINGS: CT CHEST FINDINGS Cardiovascular: The heart is normal in size and there is no pericardial effusion. Scattered coronary artery calcifications are noted. There is a left-sided pacemaker with leads terminating in the heart. There is mild atherosclerotic calcification of the aorta without evidence of aneurysm. Pulmonary trunk is normal in caliber. Mediastinum/Nodes: No enlarged mediastinal, hilar, or axillary lymph nodes. Thyroid gland, trachea, and esophagus demonstrate no significant findings. There is a large hiatal hernia, unchanged from the prior exam. Lungs/Pleura: Apical pleural scarring and mild subpleural fibrosis is noted bilaterally. Mild atelectasis is present in the lower lobes bilaterally adjacent to the hernia pouch. No effusion or pneumothorax. Musculoskeletal: An old rib fracture is noted at T10 on the right. Degenerative changes are present in the thoracic spine. A compression deformity is present in the inferior endplate of T12 which is unchanged from the prior exam. CT ABDOMEN PELVIS FINDINGS Hepatobiliary: No focal liver abnormality is seen. There is mild biliary ductal dilatation which is likely  related to patient's post cholecystectomy status. Pancreas: Unremarkable. No pancreatic ductal dilatation or surrounding inflammatory changes. Spleen: Normal in size without focal abnormality. Adrenals/Urinary Tract: Adrenal glands are unremarkable. Kidneys are normal, without renal calculi, focal lesion, or hydronephrosis. Bladder is unremarkable. Stomach/Bowel: There is a large hiatal hernia with the gastric body  and antrum intrathoracic in location. Air-fluid levels are noted in the stomach. The gastric fundus and proximal body are intra-abdominal in location. Scattered diverticula are present along the colon without evidence of diverticulitis. The appendix is not visualized on exam. No free air or pneumatosis is seen. Vascular/Lymphatic: Aortic atherosclerosis. No enlarged abdominal or pelvic lymph nodes. Reproductive: Uterus and bilateral adnexa are unremarkable. Other: No free fluid. A small fat containing inguinal hernia is noted on the left. Musculoskeletal: Degenerative changes in the lumbar spine. No acute osseous abnormality is seen. IMPRESSION: 1. Large hiatal hernia with the gastric body and antrum intrathoracic in location and air-fluid levels in the stomach, unchanged from the previous exam. Findings are concerning for gastric volvulus or early obstruction. Surgical consultation is recommended. 2. Coronary artery calcifications. 3. Stable compression deformity in the inferior endplate of 624THL. Electronically Signed   By: Brett Fairy M.D.   On: 03/10/2021 01:02   DG Chest Port 1 View  Result Date: 03/09/2021 CLINICAL DATA:  Chest pain EXAM: PORTABLE CHEST 1 VIEW COMPARISON:  02/16/2021 FINDINGS: Cardiac shadow is stable. Pacing device is again noted. Large hiatal hernia is again seen. Lungs are clear bilaterally. No bony abnormality is noted. IMPRESSION: Stable large hiatal hernia.  No new focal abnormality is noted. Electronically Signed   By: Inez Catalina M.D.   On: 03/09/2021 23:17    Anti-infectives: Anti-infectives (From admission, onward)    None       Assessment/Plan Large paraesophageal hernia with volvulus features -NPO -pain control -would benefit from surgical reduction and repair. Will work to coordinate timing -She has previously been seen and treated nonoperatively and is working toward repair with Dr. Hassell Done as outpatient.   LOS: 0 days   [facility] 03/10/2021, 6:54 AM Please see Amion for pager number during day hours 7:00am-4:30pm or 7:00am -11:30am on weekends

## 2021-03-10 NOTE — ED Notes (Signed)
MD Rathore at bedside  °

## 2021-03-10 NOTE — ED Notes (Signed)
MD Loney Loh and MD Toniann Fail notified of manual BP

## 2021-03-10 NOTE — Plan of Care (Signed)
We were asked our opinion about 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  I saw patient a couple weeks ago for the same problem.  Patient was minimally symptomatic with some abdominal and chest tightness and some intermittent nausea and vomiting.  Her endoscopy couple weeks ago showed findings consistent with intrathoracic stomach and gastric volvulus, but no changes of ischemia and no tight angulation was encountered, thus not sure how much her endoscopy helped her.  Floor nurses, CRNA, anesthesiologist were all unable to place nasogastric tube due to resistance encountered in nasal cavity vs nasopharynx at that time.  Patient was discharged home after having seen surgery, with hopes for outpatient surgical management.  Based on the endoscopy I did, and her rapid readmission, I do not feel like the endoscopy helped patient very much, I feel her intrathoracic stomach/gastric volvulus is not recurrent, but rather has been about the same then and now, though patient's symptoms clearly can change based on her diet.  As such, I do not feel there is any utility in repeat endoscopy.  Moreover, multiple different practitioners have been unsuccessful in placing nasogastric tube during her last admission.  I would recommend repeat surgical evaluation (as is being done) for consideration of definitive inpatient surgical treatment.

## 2021-03-10 NOTE — ED Notes (Signed)
Pt had one episode of vomiting - MD and PA notified

## 2021-03-10 NOTE — ED Notes (Signed)
MD Rathore aware of recheck of 100/45 - manual to be obtained

## 2021-03-10 NOTE — Progress Notes (Signed)
ANTICOAGULATION CONSULT NOTE - Initial Consult  Pharmacy Consult for heparin Indication: atrial fibrillation  No Known Allergies  Patient Measurements: Height: 5\' 2"  (157.5 cm) Weight: 71.8 kg (158 lb 4.6 oz) IBW/kg (Calculated) : 50.1 Heparin Dosing Weight: 65.4 kg  Vital Signs: Temp: 97.6 F (36.4 C) (12/09 0743) Temp Source: Oral (12/09 0743) BP: 138/59 (12/09 1000) Pulse Rate: 60 (12/09 1000)  Labs: Recent Labs    03/09/21 2223 03/10/21 0401  HGB 10.4*  --   HCT 31.9*  --   PLT 189  --   CREATININE 0.98 0.96  TROPONINIHS 8  --     Estimated Creatinine Clearance: 41.9 mL/min (by C-G formula based on SCr of 0.96 mg/dL).   Medical History: Past Medical History:  Diagnosis Date   High cholesterol    Hypertension     Medications: see MAR  Assessment: 80 yo F with pAF (PTA eliquis), CHF, HTN, HLD and GERD. Pt with N/V bc of recurrent gastric volvulus with partial obstruction. GI following. Holding PTA eliquis, last dose 12/8 AM - heparin gtt in the meantime. CBC ok - at bsl, Hgb 10.4, PLTc 189.  Goal of Therapy:  Heparin level 0.3-0.7 units/ml aPTT 66-102 seconds Monitor platelets by anticoagulation protocol: Yes   Plan: No heparin bolus given PTA DOAC Start heparin infusion at 900 units/hr F/u 8h aPTT/HL and daily levels Monitor for s/sx of bleeding  14/8, PharmD, [facility] Emergency Medicine Clinical Pharmacist ED RPh Phone: [phone] Main RX: [phone]

## 2021-03-10 NOTE — ED Notes (Signed)
MD Rathore notified of pt BP

## 2021-03-10 NOTE — Progress Notes (Signed)
ANTICOAGULATION CONSULT NOTE  Pharmacy Consult for heparin Indication: atrial fibrillation  No Known Allergies  Patient Measurements: Height: 5\' 2"  (157.5 cm) Weight: 71.8 kg (158 lb 4.6 oz) IBW/kg (Calculated) : 50.1 Heparin Dosing Weight: 65.4 kg  Vital Signs: Temp: 98.5 F (36.9 C) (12/09 2102) Temp Source: Oral (12/09 2102) BP: 136/61 (12/09 2102) Pulse Rate: 60 (12/09 2102)  Labs: Recent Labs    03/09/21 2223 03/10/21 0401 03/10/21 1941 03/10/21 2222  HGB 10.4*  --   --   --   HCT 31.9*  --   --   --   PLT 189  --   --   --   APTT  --   --  >200* >200*  HEPARINUNFRC  --   --  >1.10* >1.10*  CREATININE 0.98 0.96  --   --   TROPONINIHS 8  --   --   --      Estimated Creatinine Clearance: 41.9 mL/min (by C-G formula based on SCr of 0.96 mg/dL).   Assessment: 80 yo F with pAF (PTA eliquis), CHF, HTN, HLD and GERD. Pt with N/V bc of recurrent gastric volvulus with partial obstruction. GI following. Holding PTA eliquis, last dose 12/8 AM - heparin gtt in the meantime. Apixaban likely affecting heparin levels so will utilize aPTT until levels correlate.  Heparin level > 1.1 (affected by apixaban), aPTT >200 sec. Repeated to confirm as initial levels thought to be an error. Repeat labs drawn from L side and heparin running on R side. Repeat labs confirm heparin level >1.1 and aPTT > 200 sec (supratherapeutic) on gtt at 9000 units/hr. No bleeding noted.  Goal of Therapy:  Heparin level 0.3-0.7 units/ml aPTT 66-102 seconds Monitor platelets by anticoagulation protocol: Yes   Plan:  Hold heparin x 1 hour Restart heparin at reduced rate of 700 units/hr F/u 8h aPTT post restart  14/8, PharmD, BCPS Please see amion for complete clinical pharmacist phone list 03/10/2021 11:37 PM

## 2021-03-10 NOTE — ED Notes (Signed)
Pt taken to CT.

## 2021-03-10 NOTE — ED Notes (Signed)
MD Toniann Fail and MD Rathore notified of BP after bolus and cont fluids running - per MD Kakrakandy BP appears stable

## 2021-03-10 NOTE — Progress Notes (Signed)
PROGRESS NOTE                                                                                                                                                                                                             Patient Demographics:    Danielle Blanchard, is a 80 y.o. female, DOB - 24-Mar-1941, [id number]  Outpatient Primary MD for the patient is [facility] [place] - 0  Admit date - 03/09/2021    Chief Complaint  Patient presents with   Chest Pain   Abdominal Pain       Brief Narrative (HPI from H&P)  Danielle Blanchard is a 80 y.o. 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   Subjective:    Danielle Blanchard today has, No headache, No chest pain, No abdominal pain - No Nausea, No new weakness tingling or numbness, no SOB.   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  She has been treated conservatively with bowel rest and IV fluids, her symptoms are much better and abdominal exam now is benign, she is passing some flatus and known nausea at this time.  Plan is n.p.o. except medications, IV fluids, prepare for surgical repair of her hiatal hernia, general surgery GI following.  Case discussed with general surgery PA Saverio Danker on 03/10/2021.  Of note patient will be a moderate to high risk for  adverse cardiopulmonary outcome during the perioperative period, this is due to her advanced age and underlying medical issues, risks and benefits were discussed with patient and patient's son who accepts the risk and want to proceed for surgical intervention.  2.  Paroxysmal atrial fibrillation.  Mali vas 2 score of greater than 4.  Heparin drip instead of Eliquis, continue Coreg and amiodarone orally with a sip  of water  3.  Hypertension.  Continue Coreg along with as needed IV Lopressor and hydralazine.  4.  History of sick sinus syndrome.  S/p pacemaker placement.  5.  Chronic diastolic CHF EF 60% last echocardiogram at [facility].  Repeat echo as we have time to prepare for the surgical procedure.  Evaluate EF and valve function.  6.  Severe hypokalemia.  Replaced IV will monitor.         Condition - Guarded  Family Communication  :  son Mellody Dance [phone] - on 03/10/21  Code Status :  Full  Consults  :  CCS  PUD Prophylaxis : PPI   Procedures  :     TTE  CT - 1. Large hiatal hernia with the gastric body and antrum intrathoracic in location and air-fluid levels in the stomach, unchanged from the previous exam. Findings are concerning for gastric volvulus or early obstruction. Surgical consultation is recommended. 2. Coronary artery calcifications. 3. Stable compression deformity in the inferior endplate of T12.       Disposition Plan  :    Status is: Inpatient  Remains inpatient appropriate because: GI Volvulus  DVT Prophylaxis  :    heparin injection 5,000 Units Start: 03/10/21 0800 SCDs Start: 03/10/21 0229    Lab Results  Component Value Date   PLT 189 03/09/2021    Diet :  Diet Order             Diet NPO time specified Except for: Sips with Meds  Diet effective now                    Inpatient Medications  Scheduled Meds:  amiodarone  200 mg Oral Daily   carvedilol  3.125 mg Oral BID WC   heparin injection (subcutaneous)  5,000 Units  Subcutaneous Q8H   [START ON 03/11/2021] levothyroxine  75 mcg Oral QAC breakfast   pantoprazole (PROTONIX) IV  40 mg Intravenous Q24H   rosuvastatin  20 mg Oral Daily   Continuous Infusions:  lactated ringers with kcl 75 mL/hr at 03/10/21 0093   lactated ringers     potassium chloride 10 mEq (03/10/21 0956)   promethazine (PHENERGAN) injection (IM or IVPB)     PRN Meds:.hydrALAZINE, metoprolol tartrate, morphine injection, promethazine (PHENERGAN) injection (IM or IVPB)  Antibiotics  :    Anti-infectives (From admission, onward)    None        Time Spent in minutes  30   Susa Raring M.D on 03/10/2021 at 10:11 AM  To page go to [email or web address]   [facility] -  Office  [phone]  See all Orders from today for further details    Objective:   Vitals:   03/10/21 0600 03/10/21 0615 03/10/21 0715 03/10/21 0743  BP: (!) 109/48 106/64 (!) 118/58   Pulse: (!) 59 60 60   Resp: 15 14 16    Temp:    97.6 F (36.4 C)  TempSrc:    Oral  SpO2: 90% 95% 92%   Weight:      Height:        Wt Readings from Last 3 Encounters:  03/09/21 71.8 kg  02/13/21 71.8 kg  07/22/19 65.8 kg     Intake/Output Summary (Last 24 hours) at 03/10/2021 1011 Last data filed at 03/10/2021 0953 Gross per 24 hour  Intake 654.27 ml  Output --  Net 654.27 ml     Physical Exam  Awake Alert, No new F.N deficits, Normal affect [place].AT,[name] Supple  Neck, No JVD,   Symmetrical Chest wall movement, Good air movement bilaterally, CTAB RRR,No Gallops,Rubs or new Murmurs,  +ve B.Sounds, Abd Soft, No tenderness,   No Cyanosis, Clubbing or edema      Data Review:    CBC Recent Labs  Lab 03/09/21 2223  WBC 5.9  HGB 10.4*  HCT 31.9*  PLT 189  MCV 96.1  MCH 31.3  MCHC 32.6  RDW 15.3  LYMPHSABS 1.2  MONOABS 0.6  EOSABS 0.2  BASOSABS 0.1    Electrolytes Recent Labs  Lab 03/09/21 2223 03/10/21 0401  NA 135 137  K 3.1* 2.8*  CL 97* 95*  CO2 30 31  GLUCOSE 137* 124*   BUN 19 19  CREATININE 0.98 0.96  CALCIUM 9.1 8.9  AST 50*  --   ALT 52*  --   ALKPHOS 72  --   BILITOT 0.8  --   ALBUMIN 3.5  --   MG  --  2.1    ------------------------------------------------------------------------------------------------------------------ No results for input(s): CHOL, HDL, LDLCALC, TRIG, CHOLHDL, LDLDIRECT in the last 72 hours.  No results found for: HGBA1C  No results for input(s): TSH, T4TOTAL, T3FREE, THYROIDAB in the last 72 hours.  Invalid input(s): FREET3 ------------------------------------------------------------------------------------------------------------------ ID Labs Recent Labs  Lab 03/09/21 2223 03/10/21 0401  WBC 5.9  --   PLT 189  --   CREATININE 0.98 0.96   Cardiac Enzymes No results for input(s): CKMB, TROPONINI, MYOGLOBIN in the last 168 hours.  Invalid input(s): CK   Radiology Reports CT CHEST ABDOMEN PELVIS W CONTRAST  Result Date: 03/10/2021 CLINICAL DATA:  Chest pain.  History of hernia. EXAM: CT CHEST, ABDOMEN, AND PELVIS WITH CONTRAST TECHNIQUE: Multidetector CT imaging of the chest, abdomen and pelvis was performed following the standard protocol during bolus administration of intravenous contrast. CONTRAST:  142mL OMNIPAQUE IOHEXOL 300 MG/ML  SOLN COMPARISON:  02/11/2021. FINDINGS: CT CHEST FINDINGS Cardiovascular: The heart is normal in size and there is no pericardial effusion. Scattered coronary artery calcifications are noted. There is a left-sided pacemaker with leads terminating in the heart. There is mild atherosclerotic calcification of the aorta without evidence of aneurysm. Pulmonary trunk is normal in caliber. Mediastinum/Nodes: No enlarged mediastinal, hilar, or axillary lymph nodes. Thyroid gland, trachea, and esophagus demonstrate no significant findings. There is a large hiatal hernia, unchanged from the prior exam. Lungs/Pleura: Apical pleural scarring and mild subpleural fibrosis is noted bilaterally. Mild  atelectasis is present in the lower lobes bilaterally adjacent to the hernia pouch. No effusion or pneumothorax. Musculoskeletal: An old rib fracture is noted at T10 on the right. Degenerative changes are present in the thoracic spine. A compression deformity is present in the inferior endplate of 624THL which is unchanged from the prior exam. CT ABDOMEN PELVIS FINDINGS Hepatobiliary: No focal liver abnormality is seen. There is mild biliary ductal dilatation which is likely related to patient's post cholecystectomy status. Pancreas: Unremarkable. No pancreatic ductal dilatation or surrounding inflammatory changes. Spleen: Normal in size without focal abnormality. Adrenals/Urinary Tract: Adrenal glands are unremarkable. Kidneys are normal, without renal calculi, focal lesion, or hydronephrosis. Bladder is unremarkable. Stomach/Bowel: There is a large hiatal hernia with the gastric body and antrum intrathoracic in location. Air-fluid levels are noted in the stomach. The gastric fundus and proximal body are intra-abdominal in location. Scattered diverticula are present along the colon without evidence of diverticulitis. The appendix is not visualized on exam. No free air or pneumatosis is seen. Vascular/Lymphatic: Aortic atherosclerosis. No enlarged abdominal or pelvic lymph  nodes. Reproductive: Uterus and bilateral adnexa are unremarkable. Other: No free fluid. A small fat containing inguinal hernia is noted on the left. Musculoskeletal: Degenerative changes in the lumbar spine. No acute osseous abnormality is seen. IMPRESSION: 1. Large hiatal hernia with the gastric body and antrum intrathoracic in location and air-fluid levels in the stomach, unchanged from the previous exam. Findings are concerning for gastric volvulus or early obstruction. Surgical consultation is recommended. 2. Coronary artery calcifications. 3. Stable compression deformity in the inferior endplate of 624THL. Electronically Signed   By: Brett Fairy  M.D.   On: 03/10/2021 01:02   DG Chest Port 1 View  Result Date: 03/09/2021 CLINICAL DATA:  Chest pain EXAM: PORTABLE CHEST 1 VIEW COMPARISON:  02/16/2021 FINDINGS: Cardiac shadow is stable. Pacing device is again noted. Large hiatal hernia is again seen. Lungs are clear bilaterally. No bony abnormality is noted. IMPRESSION: Stable large hiatal hernia.  No new focal abnormality is noted. Electronically Signed   By: Inez Catalina M.D.   On: 03/09/2021 23:17

## 2021-03-10 NOTE — H&P (Signed)
History and Physical    Danielle Blanchard OMB:[id number] DOB: 1940-11-11 DOA: 03/09/2021  PCP: [facility] Patient coming from: Home  Chief Complaint: Chest pain  HPI: Danielle Blanchard is a 80 y.o. female with medical history significant of sick sinus syndrome post PPM, chronic diastolic CHF, hypertension, hyperlipidemia, GERD.  Recently admitted on 11/12 for left upper quadrant abdominal pain and CT revealed large hiatal hernia complicated by gastric volvulus and obstruction.  Patient was seen by GI and underwent decompression by endoscopy on 11/14.  She returns to the ED with left-sided chest.  She had 1 episode of vomiting in the ED.  Labs showing no leukocytosis.  Hemoglobin low but stable compared to prior labs.  Potassium 3.1.  AST 50, ALT 52.  Alk phos and T bili normal.  High-sensitivity troponin negative.  EKG without acute ischemic changes.  Chest x-ray showing stable large hiatal hernia.  CT chest/abdomen/pelvis showing large hiatal hernia with the gastric body and antrum intrathoracic in location and air-fluid levels in the abdomen, unchanged from previous exam.  Findings concerning for gastric volvulus or early obstruction.  General surgery (Dr. Kieth Brightly) consulted.  [facility] consulted.  Patient states yesterday she started having left-sided chest pain.  She does not remember what she was doing at that time.  She is not able to describe what the pain feels like.  Denies nausea or vomiting.  States she has been eating food at home without any problem.  Denies abdominal pain.  No additional history could be obtained from her.  Review of Systems:  All systems reviewed and apart from history of presenting illness, are negative.  Past Medical History:  Diagnosis Date   High cholesterol    Hypertension     Past Surgical History:  Procedure Laterality Date   ESOPHAGOGASTRODUODENOSCOPY (EGD) WITH PROPOFOL N/A 02/13/2021   Procedure: ESOPHAGOGASTRODUODENOSCOPY  (EGD) WITH PROPOFOL;  Surgeon: Arta Silence, MD;  Location: [facility];  Service: Gastroenterology;  Laterality: N/A;   PACEMAKER PLACEMENT       reports that she has never smoked. She has never used smokeless tobacco. She reports that she does not drink alcohol and does not use drugs.  No Known Allergies  History reviewed. No pertinent family history.  Prior to Admission medications   Medication Sig Start Date End Date Taking? Authorizing Provider  acetaminophen (TYLENOL) 500 MG tablet Take 1,000 mg by mouth every 6 (six) hours as needed for moderate pain or headache.   Yes [provider]  amiodarone (PACERONE) 200 MG tablet Take 200 mg by mouth daily.   Yes [provider]  apixaban (ELIQUIS) 5 MG TABS tablet Take 5 mg by mouth 2 (two) times daily.   Yes [provider]  carvedilol (COREG) 3.125 MG tablet Take 3.125 mg by mouth 2 (two) times daily with a meal.   Yes [provider]  hydrochlorothiazide (MICROZIDE) 12.5 MG capsule Take 12.5 mg by mouth daily.   Yes [provider]  levothyroxine (SYNTHROID) 75 MCG tablet Take 75 mcg by mouth daily before breakfast.   Yes [provider]  losartan (COZAAR) 50 MG tablet Take 50 mg by mouth 2 (two) times daily.   Yes [provider]  Melatonin 10 MG TABS Take 10 mg by mouth at bedtime as needed (sleep).   Yes [provider]  omeprazole (PRILOSEC) 40 MG capsule Take 1 capsule (40 mg total) by mouth daily. Take 1 tablet in the morning 30 minutes before you eat.  Take daily. Patient taking differently: Take 40 mg by mouth daily as needed (stomach acid). 12/06/19  Yes Pfeiffer, Jeannie Done, MD  Polyethyl Glycol-Propyl Glycol (SYSTANE) 0.4-0.3 % GEL ophthalmic gel Place 1 application into both eyes 3 (three) times daily as needed. Patient taking differently: Place 1 application into both eyes 3 (three) times daily as needed (for dry eyes). 02/15/20  Yes Yu, Amy V, PA-C   rosuvastatin (CRESTOR) 20 MG tablet Take 20 mg by mouth daily.   Yes [provider]  traZODone (DESYREL) 50 MG tablet Take 25-50 mg by mouth at bedtime as needed for sleep.   Yes [provider]    Physical Exam: Vitals:   03/09/21 2330 03/10/21 0000 03/10/21 0050 03/10/21 0115  BP: (!) 122/48 122/77 (!) 114/49 (!) 148/63  Pulse: 65 61 60 67  Resp: 16 13 17 16   Temp:      TempSrc:      SpO2: 98% 98% 98% 96%  Weight:      Height:        Physical Exam Constitutional:      General: She is not in acute distress. HENT:     Head: Normocephalic and atraumatic.  Eyes:     Extraocular Movements: Extraocular movements intact.     Conjunctiva/sclera: Conjunctivae normal.  Cardiovascular:     Rate and Rhythm: Normal rate and regular rhythm.     Pulses: Normal pulses.  Pulmonary:     Effort: Pulmonary effort is normal. No respiratory distress.     Breath sounds: Normal breath sounds. No wheezing or rales.  Abdominal:     General: Bowel sounds are normal. There is no distension.     Palpations: Abdomen is soft.     Tenderness: There is no abdominal tenderness.  Musculoskeletal:        General: No swelling or tenderness.     Cervical back: Normal range of motion and neck supple.  Skin:    General: Skin is warm and dry.  Neurological:     General: No focal deficit present.     Mental Status: She is alert and oriented to person, place, and time.     Labs on Admission: I have personally reviewed following labs and imaging studies  CBC: Recent Labs  Lab 03/09/21 2223  WBC 5.9  NEUTROABS 3.8  HGB 10.4*  HCT 31.9*  MCV 96.1  PLT 914   Basic Metabolic Panel: Recent Labs  Lab 03/09/21 2223  NA 135  K 3.1*  CL 97*  CO2 30  GLUCOSE 137*  BUN 19  CREATININE 0.98  CALCIUM 9.1   GFR: Estimated Creatinine Clearance: 41.1 mL/min (by C-G formula based on SCr of 0.98 mg/dL). Liver Function Tests: Recent Labs  Lab 03/09/21 2223  AST 50*  ALT 52*   ALKPHOS 72  BILITOT 0.8  PROT 6.4*  ALBUMIN 3.5   No results for input(s): LIPASE, AMYLASE in the last 168 hours. No results for input(s): AMMONIA in the last 168 hours. Coagulation Profile: No results for input(s): INR, PROTIME in the last 168 hours. Cardiac Enzymes: No results for input(s): CKTOTAL, CKMB, CKMBINDEX, TROPONINI in the last 168 hours. BNP (last 3 results) No results for input(s): PROBNP in the last 8760 hours. HbA1C: No results for input(s): HGBA1C in the last 72 hours. CBG: No results for input(s): GLUCAP in the last 168 hours. Lipid Profile: No results for input(s): CHOL, HDL, LDLCALC, TRIG, CHOLHDL, LDLDIRECT in the last 72 hours. Thyroid Function Tests: No results for  input(s): TSH, T4TOTAL, FREET4, T3FREE, THYROIDAB in the last 72 hours. Anemia Panel: No results for input(s): VITAMINB12, FOLATE, FERRITIN, TIBC, IRON, RETICCTPCT in the last 72 hours. Urine analysis:    Component Value Date/Time   COLORURINE YELLOW 02/11/2021 1200   APPEARANCEUR CLEAR 02/11/2021 1200   LABSPEC 1.015 02/11/2021 1200   PHURINE 5.0 02/11/2021 1200   GLUCOSEU NEGATIVE 02/11/2021 1200   HGBUR NEGATIVE 02/11/2021 1200   BILIRUBINUR NEGATIVE 02/11/2021 1200   KETONESUR NEGATIVE 02/11/2021 1200   PROTEINUR 30 (A) 02/11/2021 1200   NITRITE NEGATIVE 02/11/2021 1200   LEUKOCYTESUR NEGATIVE 02/11/2021 1200    Radiological Exams on Admission: CT CHEST ABDOMEN PELVIS W CONTRAST  Result Date: 03/10/2021 CLINICAL DATA:  Chest pain.  History of hernia. EXAM: CT CHEST, ABDOMEN, AND PELVIS WITH CONTRAST TECHNIQUE: Multidetector CT imaging of the chest, abdomen and pelvis was performed following the standard protocol during bolus administration of intravenous contrast. CONTRAST:  160m OMNIPAQUE IOHEXOL 300 MG/ML  SOLN COMPARISON:  02/11/2021. FINDINGS: CT CHEST FINDINGS Cardiovascular: The heart is normal in size and there is no pericardial effusion. Scattered coronary artery  calcifications are noted. There is a left-sided pacemaker with leads terminating in the heart. There is mild atherosclerotic calcification of the aorta without evidence of aneurysm. Pulmonary trunk is normal in caliber. Mediastinum/Nodes: No enlarged mediastinal, hilar, or axillary lymph nodes. Thyroid gland, trachea, and esophagus demonstrate no significant findings. There is a large hiatal hernia, unchanged from the prior exam. Lungs/Pleura: Apical pleural scarring and mild subpleural fibrosis is noted bilaterally. Mild atelectasis is present in the lower lobes bilaterally adjacent to the hernia pouch. No effusion or pneumothorax. Musculoskeletal: An old rib fracture is noted at T10 on the right. Degenerative changes are present in the thoracic spine. A compression deformity is present in the inferior endplate of TB93which is unchanged from the prior exam. CT ABDOMEN PELVIS FINDINGS Hepatobiliary: No focal liver abnormality is seen. There is mild biliary ductal dilatation which is likely related to patient's post cholecystectomy status. Pancreas: Unremarkable. No pancreatic ductal dilatation or surrounding inflammatory changes. Spleen: Normal in size without focal abnormality. Adrenals/Urinary Tract: Adrenal glands are unremarkable. Kidneys are normal, without renal calculi, focal lesion, or hydronephrosis. Bladder is unremarkable. Stomach/Bowel: There is a large hiatal hernia with the gastric body and antrum intrathoracic in location. Air-fluid levels are noted in the stomach. The gastric fundus and proximal body are intra-abdominal in location. Scattered diverticula are present along the colon without evidence of diverticulitis. The appendix is not visualized on exam. No free air or pneumatosis is seen. Vascular/Lymphatic: Aortic atherosclerosis. No enlarged abdominal or pelvic lymph nodes. Reproductive: Uterus and bilateral adnexa are unremarkable. Other: No free fluid. A small fat containing inguinal hernia is  noted on the left. Musculoskeletal: Degenerative changes in the lumbar spine. No acute osseous abnormality is seen. IMPRESSION: 1. Large hiatal hernia with the gastric body and antrum intrathoracic in location and air-fluid levels in the stomach, unchanged from the previous exam. Findings are concerning for gastric volvulus or early obstruction. Surgical consultation is recommended. 2. Coronary artery calcifications. 3. Stable compression deformity in the inferior endplate of TJ03 Electronically Signed   By: LBrett FairyM.D.   On: 03/10/2021 01:02   DG Chest Port 1 View  Result Date: 03/09/2021 CLINICAL DATA:  Chest pain EXAM: PORTABLE CHEST 1 VIEW COMPARISON:  02/16/2021 FINDINGS: Cardiac shadow is stable. Pacing device is again noted. Large hiatal hernia is again seen. Lungs are clear bilaterally. No bony abnormality is  noted. IMPRESSION: Stable large hiatal hernia.  No new focal abnormality is noted. Electronically Signed   By: Inez Catalina M.D.   On: 03/09/2021 23:17    EKG: Independently reviewed.  Paced rhythm, no significant change since prior tracing.  Assessment/Plan Principal Problem:   Gastric volvulus Active Problems:   Large hiatal hernia   A-fib (HCC)   Sick sinus syndrome (HCC)   CHF (congestive heart failure) (HCC)   Large hiatal hernia complicated by gastric volvulus and obstruction CT showing large hiatal hernia with the gastric body and antrum intrathoracic in location and air-fluid levels in the abdomen, unchanged from previous exam.  Findings concerning for gastric volvulus or early obstruction.  Patient had 1 episode of vomiting in the ED but no recurrence since then.  Admitted last month for the same problem and underwent endoscopic decompression. -Bowel rest, gentle IV fluid hydration.  Antiemetic as needed.  Hold home Eliquis.  GI and general surgery consulted.  Left-sided chest pain Likely due to problem listed above.  Troponin negative and EKG without acute ischemic  changes. -Pain management  Mild hypokalemia -Monitor potassium and magnesium levels, replenish as needed.  A. Fib -Hold Eliquis at this time.  Sick sinus syndrome status post PPM  Chronic diastolic CHF No signs of volume overload. -Monitor volume status closely  Hypertension Stable. -IV hydralazine PRN  DVT prophylaxis: SCDs Code Status: Patient wishes to be full code. Family Communication: No family available at this time. Disposition Plan: Status is: Inpatient  Remains inpatient appropriate because: Gastric volvulus and obstruction  Level of care: Level of care: Telemetry Cardiac  The medical decision making on this patient was of high complexity and the patient is at high risk for clinical deterioration, therefore this is a level 3 visit.  Shela Leff MD [facility]  If 7PM-7AM, please contact night-coverage [email or web address]  03/10/2021, 2:26 AM

## 2021-03-10 NOTE — Evaluation (Signed)
Occupational Therapy Evaluation Patient Details Name: Danielle Blanchard MRN: [id number] DOB: Mar 10, 1941 Today's Date: 03/10/2021   History of Present Illness 80 y.o female with medical history significant of sick sinus syndrome post PPM, chronic diastolic CHF, hypertension, hyperlipidemia, GERD.  Recently admitted on 11/12 for left upper quadrant abdominal pain.  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  Patient was seen by GI and underwent decompression by endoscopy on 11/14.  She returns to the ED with left-sided chest pain.  Chest x-ray showing stable large hiatal hernia.  CT chest/abdomen/pelvis showing large hiatal hernia.  GI considering repeat surgical evaluation.   Clinical Impression   Patient admitted for the above diagnosis.  Of note, she was admitted a few weeks ago for the same diagnosis.  PTA she lives in a second level apartment with her son.  The son works during the day, and she is by herself during that time.  The son assists with community mobility, medication setup and bill payment.  The patient is able to complete her own self care and light meal prep.  Deficits impacting independence are listed below.  Currently she is needing Min Guard for basic mobility and self care from a sit/stand level.  OT to follow in the acute setting as surgical plan is still being worked up, but home with ? HH OT is a possibility.  Based on today's eval, no OT post acute is anticipated.       Recommendations for follow up therapy are one component of a multi-disciplinary discharge planning process, led by the attending physician.  Recommendations may be updated based on patient status, additional functional criteria and insurance authorization.   Follow Up Recommendations  No OT follow up    Assistance Recommended at Discharge Intermittent Supervision/Assistance  Functional Status Assessment  Patient has had a recent decline in their functional status and  demonstrates the ability to make significant improvements in function in a reasonable and predictable amount of time.  Equipment Recommendations  Tub/shower seat    Recommendations for Other Services       Precautions / Restrictions Precautions Precautions: Fall Restrictions Weight Bearing Restrictions: No      Mobility Bed Mobility Overal bed mobility: Needs Assistance Bed Mobility: Supine to Sit;Sit to Supine     Supine to sit: Supervision Sit to supine: Supervision        Transfers Overall transfer level: Needs assistance   Transfers: Sit to/from Stand Sit to Stand: Supervision                  Balance Overall balance assessment: Needs assistance Sitting-balance support: Feet supported Sitting balance-Leahy Scale: Good     Standing balance support: Single extremity supported Standing balance-Leahy Scale: Fair                             ADL either performed or assessed with clinical judgement   ADL       Grooming: Wash/dry hands;Min guard;Standing       Lower Body Bathing: Min guard;Sit to/from stand       Lower Body Dressing: Min guard;Sit to/from stand   Toilet Transfer: Hydrographic surveyor Details (indicate cue type and reason): pushing IV pole from ED room 37 to RR #1 Toileting- Clothing Manipulation and Hygiene: Supervision/safety;Sitting/lateral lean               Vision Patient Visual Report: No change from baseline  Perception Perception Perception: Within Functional Limits   Praxis Praxis Praxis: Intact    Pertinent Vitals/Pain Pain Assessment: No/denies pain     Hand Dominance Right   Extremity/Trunk Assessment Upper Extremity Assessment Upper Extremity Assessment: Overall WFL for tasks assessed   Lower Extremity Assessment Lower Extremity Assessment: Defer to PT evaluation   Cervical / Trunk Assessment Cervical / Trunk Assessment: Normal   Communication  Communication Communication: No difficulties   Cognition Arousal/Alertness: Awake/alert Behavior During Therapy: WFL for tasks assessed/performed Overall Cognitive Status: Within Functional Limits for tasks assessed                                       General Comments   VSS on RA    Exercises     Shoulder Instructions      Home Living Family/patient expects to be discharged to:: Private residence Living Arrangements: Children Available Help at Discharge: Family;Available PRN/intermittently Type of Home: Apartment Home Access: Stairs to enter Entrance Stairs-Number of Steps: 17   Home Layout: One level     Bathroom Shower/Tub: Chief Strategy Officer: Standard Bathroom Accessibility: Yes How Accessible: Accessible via walker Home Equipment: Rolling Walker (2 wheels);Cane - single point   Additional Comments: Son works first shift, and she home alone during this time.      Prior Functioning/Environment Prior Level of Function : Independent/Modified Independent             Mobility Comments: Walks household distances without RW or SPC.  Increased effort for stairs. ADLs Comments: Patient completes her own showers, light meal prep.  Son sets up medications and assist with community mobility and bill payment.        OT Problem List: Decreased activity tolerance;Impaired balance (sitting and/or standing)      OT Treatment/Interventions: Self-care/ADL training;Therapeutic activities;Balance training;Patient/family education    OT Goals(Current goals can be found in the care plan section) Acute Rehab OT Goals Patient Stated Goal: Looking forward to going home OT Goal Formulation: With patient Time For Goal Achievement: 03/24/21 Potential to Achieve Goals: Good  OT Frequency: Min 2X/week   Barriers to D/C:    PRN assist from son.       Co-evaluation              AM-PAC OT "6 Clicks" Daily Activity     Outcome Measure Help  from another person eating meals?: None Help from another person taking care of personal grooming?: A Little Help from another person toileting, which includes using toliet, bedpan, or urinal?: A Little Help from another person bathing (including washing, rinsing, drying)?: A Little Help from another person to put on and taking off regular upper body clothing?: None Help from another person to put on and taking off regular lower body clothing?: A Little 6 Click Score: 20   End of Session Nurse Communication: Mobility status  Activity Tolerance: Patient tolerated treatment well Patient left: in bed;with call bell/phone within reach  OT Visit Diagnosis: Unsteadiness on feet (R26.81)                Time: 9833-8250 OT Time Calculation (min): 23 min Charges:  OT General Charges $OT Visit: 1 Visit OT Evaluation $OT Eval Moderate Complexity: 1 Mod OT Treatments $Self Care/Home Management : 8-22 mins  03/10/2021  RP, OTR/L  Acute Rehabilitation Services  Office:  [phone]   Suzanna Obey 03/10/2021, 5:12  PM

## 2021-03-11 LAB — CBC
HCT: 29.7 % — ABNORMAL LOW (ref 36.0–46.0)
Hemoglobin: 9.5 g/dL — ABNORMAL LOW (ref 12.0–15.0)
MCH: 30.7 pg (ref 26.0–34.0)
MCHC: 32 g/dL (ref 30.0–36.0)
MCV: 96.1 fL (ref 80.0–100.0)
Platelets: 146 10*3/uL — ABNORMAL LOW (ref 150–400)
RBC: 3.09 MIL/uL — ABNORMAL LOW (ref 3.87–5.11)
RDW: 15.6 % — ABNORMAL HIGH (ref 11.5–15.5)
WBC: 4.5 10*3/uL (ref 4.0–10.5)
nRBC: 0 % (ref 0.0–0.2)

## 2021-03-11 LAB — COMPREHENSIVE METABOLIC PANEL
ALT: 38 U/L (ref 0–44)
AST: 35 U/L (ref 15–41)
Albumin: 2.8 g/dL — ABNORMAL LOW (ref 3.5–5.0)
Alkaline Phosphatase: 63 U/L (ref 38–126)
Anion gap: 7 (ref 5–15)
BUN: 8 mg/dL (ref 8–23)
CO2: 29 mmol/L (ref 22–32)
Calcium: 8.7 mg/dL — ABNORMAL LOW (ref 8.9–10.3)
Chloride: 103 mmol/L (ref 98–111)
Creatinine, Ser: 0.74 mg/dL (ref 0.44–1.00)
GFR, Estimated: >60 mL/min (ref >60)
Glucose, Bld: 78 mg/dL (ref 70–99)
Potassium: 4.2 mmol/L (ref 3.5–5.1)
Sodium: 139 mmol/L (ref 135–145)
Total Bilirubin: 0.5 mg/dL (ref 0.3–1.2)
Total Protein: 5.2 g/dL — ABNORMAL LOW (ref 6.5–8.1)

## 2021-03-11 LAB — MAGNESIUM: Magnesium: 1.9 mg/dL (ref 1.7–2.4)

## 2021-03-11 LAB — APTT
aPTT: 85 seconds — ABNORMAL HIGH (ref 24–36)
aPTT: 69 seconds — ABNORMAL HIGH (ref 24–36)

## 2021-03-11 LAB — SURGICAL PCR SCREEN
MRSA, PCR: NEGATIVE
Staphylococcus aureus: POSITIVE — AB

## 2021-03-11 LAB — BRAIN NATRIURETIC PEPTIDE: B Natriuretic Peptide: 326.4 pg/mL — ABNORMAL HIGH (ref 0.0–100.0)

## 2021-03-11 LAB — HEPARIN LEVEL (UNFRACTIONATED): Heparin Unfractionated: >1.1 IU/mL — ABNORMAL HIGH (ref 0.30–0.70)

## 2021-03-11 MED ORDER — KCL IN DEXTROSE-NACL 10-5-0.45 MEQ/L-%-% IV SOLN
INTRAVENOUS | Status: DC
  Filled 2021-03-11 (×4): qty 1000

## 2021-03-11 NOTE — Progress Notes (Signed)
ANTICOAGULATION CONSULT NOTE  Pharmacy Consult for heparin Indication: atrial fibrillation  No Known Allergies  Patient Measurements: Height: 5\' 2"  (157.5 cm) Weight: 71.8 kg (158 lb 4.6 oz) IBW/kg (Calculated) : 50.1 Heparin Dosing Weight: 65.4 kg  Vital Signs: Temp: 98.3 F (36.8 C) (12/10 0918) Temp Source: Oral (12/10 0918) BP: 129/60 (12/10 0918) Pulse Rate: 64 (12/10 0918)  Labs: Recent Labs    03/09/21 2223 03/10/21 0401 03/10/21 1941 03/10/21 2222 03/11/21 0044  HGB 10.4*  --   --   --  9.5*  HCT 31.9*  --   --   --  29.7*  PLT 189  --   --   --  146*  APTT  --   --  >200* >200*  --   HEPARINUNFRC  --   --  >1.10* >1.10*  --   CREATININE 0.98 0.96  --   --  0.74  TROPONINIHS 8  --   --   --   --     Estimated Creatinine Clearance: 50.3 mL/min (by C-G formula based on SCr of 0.74 mg/dL).   Assessment: 80 yo F with pAF (PTA eliquis), CHF, HTN, HLD and GERD. Pt with N/V bc of recurrent gastric volvulus with partial obstruction. GI and surgery following with plans for surgical reduction and repair of hernia. Holding PTA eliquis, last dose 12/8 AM - heparin gtt in the meantime.   aPTT overnight was supratherapeutic >200 seconds on 900 units/hr. Heparin held and restarted  at 700 units/hr ~0100. 9 hour aPTT is therapeutic at 85 seconds on 700 units/hour. Hgb stable at 9.5, plts slightly decreased at 146. Confirmed with RN that aPTT drawn from opposite arm that heparin is infusing in.   Note the half life of Eliquis ~12 hours and patient's CrCl~50 is stable. Anticipate Eliquis being cleared from system within the next day or two. Will continue to dose off aPTT until correlates with heparin level.   Goal of Therapy:  Heparin level 0.3-0.7 units/ml aPTT 66-102 seconds Monitor platelets by anticoagulation protocol: Yes   Plan:  Continue heparin at 700 units/hr Confirmatory 8 hour aPTT/heparin level Daily aPTT/heparin level until correlating, then just daily heparin  level  F/u surgery plans to restart Eliquis   14/8, PharmD, BCPS Clinical Pharmacist Please see amion for complete clinical pharmacist phone list 03/11/2021 9:46 AM

## 2021-03-11 NOTE — Progress Notes (Signed)
Danielle Blanchard [id number] 1940/04/28  CARE TEAM:  PCP: [facility]  Outpatient Care Team: Patient Care Team: [facility] [place] as PCP - General (Family Medicine) Johnathan Hausen, MD as Consulting Physician (General Surgery) Arta Silence, MD as Consulting Physician (Gastroenterology)  Inpatient Treatment Team: Treatment Team: Attending Provider: Thurnell Lose, MD; Rounding Team: Sonda Primes, MD; Rounding Team: Edison Pace, Md, MD; Physical Therapist: Solon Augusta, PT; Utilization Review: Claudie Leach, RN; Technician: Merton Border, NT; Registered Nurse: Candida Peeling, RN; Case Manager: Bartholomew Crews, RN; Social Worker: Vern Claude, [place]; Consulting Physician: Johnathan Hausen, MD   Problem List:   Principal Problem:   Gastric volvulus Active Problems:   Large hiatal hernia   A-fib [facility])   Sick sinus syndrome ([place])   CHF (congestive heart failure) ([place])      * No surgery found *      Assessment  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  Ideally would do a laparoscopic/robotic minimally invasive approach which I think she would be appropriate.  Hiatal closure.  Sometimes have to do partial fundoplication versus gastrostomy tube.  She had been tentatively planned to seen Dr. Hassell Done with our group for this month.  I did opt that actually happened.  We need to regroup.  Would need medical and cardiac clearance.  Suspect she is increased as medicine implies.  However I think despite her ischemic cardiomyopathy with chronic heart failure, pacemaker, atrial fibrillation; she is as good as she is going to get and will more likely decline without surgical intervention.  Not ideal to  plan over the weekend given emergencies and high-volume.  However I agree that most likely needs to happen before she is discharged and she is readmitted again with issues.  Her insight right now is rather failure so we will need to regroup and discuss with family to make sure we have good informed consent and have tried to minimize risks in this moderate to high risk patient.  -VTE prophylaxis- SCDs, etc  -mobilize as tolerated to help recovery       20 minutes spent in review, evaluation, examination, counseling, and coordination of care.   I have reviewed this patient's available data, including medical history, events of note, physical examination and test results as part of my evaluation.  A significant portion of that time was spent in counseling.  Care during the described time interval was provided by me.  03/11/2021    Subjective: (Chief complaint)  Sitting in room.  Denies any nausea or vomiting.  However she confesses she has poor memory recall of what has been going on. Denies any abdominal pain.  Tried to call family at home.  Objective:  Vital signs:  Vitals:   03/10/21 2102 03/11/21 0110 03/11/21 0427 03/11/21 0918  BP: 136/61 (!) 118/54 (!) 135/54 129/60  Pulse: 60 (!) 59 65 64  Resp: 16 16 17 18   Temp: 98.5 F (36.9 C) 98.2 F (36.8 C) 98.2 F (36.8 C) 98.3 F (36.8 C)  TempSrc: Oral Oral Oral Oral  SpO2: 95% 95% 97% 94%  Weight:      Height:        Last BM Date: 03/09/21  Intake/Output   Yesterday:  12/09 0701 - 12/10 0700 In: 749.2 [  I.V.:365; IV Piggyback:384.2] Out: 700 [Urine:700] This shift:  No intake/output data recorded.  Bowel function:  Flatus: YES  BM:  No  Drain: (No drain)   Physical Exam:  General: Pt awake/alert in no acute distress Eyes: PERRL, normal EOM.  Sclera clear.  No icterus Neuro: CN II-XII intact w/o focal sensory/motor deficits.  Patient confesses to having memory loss issues. Lymph: No head/neck/groin  lymphadenopathy Psych:  No delerium/psychosis/paranoia.  Oriented x 2 HENT: Normocephalic, Mucus membranes moist.  No thrush Neck: Supple, No tracheal deviation.  No obvious thyromegaly Chest: No pain to chest wall compression.  Good respiratory excursion.  No audible wheezing CV:  Pulses intact.  Regular rhythm.  No major extremity edema MS: Normal AROM mjr joints.  No obvious deformity  Abdomen: Soft.  Nondistended.  Mildly tender at incisions only.  No evidence of peritonitis.  No incarcerated hernias.  Ext:   No deformity.  No mjr edema.  No cyanosis Skin: No petechiae / purpurea.  No major sores.  Warm and dry    Results:   Cultures: Recent Results (from the past 720 hour(s))  Resp Panel by RT-PCR (Flu A&B, Covid) Nasopharyngeal Swab     Status: None   Collection Time: 02/11/21 11:50 PM   Specimen: Nasopharyngeal Swab; Nasopharyngeal(NP) swabs in vial transport medium  Result Value Ref Range Status   SARS Coronavirus 2 by RT PCR NEGATIVE NEGATIVE Final    Comment: (NOTE) SARS-CoV-2 target nucleic acids are NOT DETECTED.  The SARS-CoV-2 RNA is generally detectable in upper respiratory specimens during the acute phase of infection. The lowest concentration of SARS-CoV-2 viral copies this assay can detect is 138 copies/mL. A negative result does not preclude SARS-Cov-2 infection and should not be used as the sole basis for treatment or other patient management decisions. A negative result may occur with  improper specimen collection/handling, submission of specimen other than nasopharyngeal swab, presence of viral mutation(s) within the areas targeted by this assay, and inadequate number of viral copies(<138 copies/mL). A negative result must be combined with clinical observations, patient history, and epidemiological information. The expected result is Negative.  Fact Sheet for Patients:  EntrepreneurPulse.com.au  Fact Sheet for Healthcare Providers:   IncredibleEmployment.be  This test is no t yet approved or cleared by the [place] FDA and  has been authorized for detection and/or diagnosis of SARS-CoV-2 by FDA under an Emergency Use Authorization (EUA). This EUA will remain  in effect (meaning this test can be used) for the duration of the COVID-19 declaration under Section 564(b)(1) of the Act, 21 U.S.C.section 360bbb-3(b)(1), unless the authorization is terminated  or revoked sooner.       Influenza A by PCR NEGATIVE NEGATIVE Final   Influenza B by PCR NEGATIVE NEGATIVE Final    Comment: (NOTE) The Xpert Xpress SARS-CoV-2/FLU/RSV plus assay is intended as an aid in the diagnosis of influenza from Nasopharyngeal swab specimens and should not be used as a sole basis for treatment. Nasal washings and aspirates are unacceptable for Xpert Xpress SARS-CoV-2/FLU/RSV testing.  Fact Sheet for Patients: EntrepreneurPulse.com.au  Fact Sheet for Healthcare Providers: IncredibleEmployment.be  This test is not yet approved or cleared by the [place] FDA and has been authorized for detection and/or diagnosis of SARS-CoV-2 by FDA under an Emergency Use Authorization (EUA). This EUA will remain in effect (meaning this test can be used) for the duration of the COVID-19 declaration under Section 564(b)(1) of the Act, 21 U.S.C. section 360bbb-3(b)(1), unless the authorization is terminated or  revoked.  Performed at [facility], [place] [address], [place], [place] [address]   Resp Panel by RT-PCR (Flu A&B, Covid) Nasopharyngeal Swab     Status: None   Collection Time: 03/10/21  1:56 AM   Specimen: Nasopharyngeal Swab; Nasopharyngeal(NP) swabs in vial transport medium  Result Value Ref Range Status   SARS Coronavirus 2 by RT PCR NEGATIVE NEGATIVE Final    Comment: (NOTE) SARS-CoV-2 target nucleic acids are NOT DETECTED.  The SARS-CoV-2 RNA is generally  detectable in upper respiratory specimens during the acute phase of infection. The lowest concentration of SARS-CoV-2 viral copies this assay can detect is 138 copies/mL. A negative result does not preclude SARS-Cov-2 infection and should not be used as the sole basis for treatment or other patient management decisions. A negative result may occur with  improper specimen collection/handling, submission of specimen other than nasopharyngeal swab, presence of viral mutation(s) within the areas targeted by this assay, and inadequate number of viral copies(<138 copies/mL). A negative result must be combined with clinical observations, patient history, and epidemiological information. The expected result is Negative.  Fact Sheet for Patients:  EntrepreneurPulse.com.au  Fact Sheet for Healthcare Providers:  IncredibleEmployment.be  This test is no t yet approved or cleared by the [place] FDA and  has been authorized for detection and/or diagnosis of SARS-CoV-2 by FDA under an Emergency Use Authorization (EUA). This EUA will remain  in effect (meaning this test can be used) for the duration of the COVID-19 declaration under Section 564(b)(1) of the Act, 21 U.S.C.section 360bbb-3(b)(1), unless the authorization is terminated  or revoked sooner.       Influenza A by PCR NEGATIVE NEGATIVE Final   Influenza B by PCR NEGATIVE NEGATIVE Final    Comment: (NOTE) The Xpert Xpress SARS-CoV-2/FLU/RSV plus assay is intended as an aid in the diagnosis of influenza from Nasopharyngeal swab specimens and should not be used as a sole basis for treatment. Nasal washings and aspirates are unacceptable for Xpert Xpress SARS-CoV-2/FLU/RSV testing.  Fact Sheet for Patients: EntrepreneurPulse.com.au  Fact Sheet for Healthcare Providers: IncredibleEmployment.be  This test is not yet approved or cleared by the [place] FDA  and has been authorized for detection and/or diagnosis of SARS-CoV-2 by FDA under an Emergency Use Authorization (EUA). This EUA will remain in effect (meaning this test can be used) for the duration of the COVID-19 declaration under Section 564(b)(1) of the Act, 21 U.S.C. section 360bbb-3(b)(1), unless the authorization is terminated or revoked.  Performed at [facility], [place] [address], [place], [place] [address]   Surgical pcr screen     Status: Abnormal   Collection Time: 03/11/21  6:12 AM   Specimen: Nasal Mucosa; Nasal Swab  Result Value Ref Range Status   MRSA, PCR NEGATIVE NEGATIVE Final   Staphylococcus aureus POSITIVE (A) NEGATIVE Final    Comment: (NOTE) The Xpert SA Assay (FDA approved for NASAL specimens in patients 64 years of age and older), is one component of a comprehensive surveillance program. It is not intended to diagnose infection nor to guide or monitor treatment. Performed at [facility], [place] [address], [place], [place] [address]     Labs: Results for orders placed or performed during the hospital encounter of 03/09/21 (from the past 48 hour(s))  Troponin I (High Sensitivity)     Status: None   Collection Time: 03/09/21 10:23 PM  Result Value Ref Range   Troponin I (High Sensitivity) 8 <18 ng/L  Comment: (NOTE) Elevated high sensitivity troponin I (hsTnI) values and significant  changes across serial measurements may suggest ACS but many other  chronic and acute conditions are known to elevate hsTnI results.  Refer to the "Links" section for chest pain algorithms and additional  guidance. Performed at [facility], [place] [address] [place], [place] [address]   CBC with Differential     Status: Abnormal   Collection Time: 03/09/21 10:23 PM  Result Value Ref Range   WBC 5.9 4.0 - 10.5 K/uL   RBC 3.32 (L) 3.87 - 5.11 MIL/uL   Hemoglobin 10.4 (L) 12.0 - 15.0 g/dL   HCT 31.9 (L) 36.0 - 46.0 %   MCV 96.1 80.0 - 100.0 fL   MCH  31.3 26.0 - 34.0 pg   MCHC 32.6 30.0 - 36.0 g/dL   RDW 15.3 11.5 - 15.5 %   Platelets 189 150 - 400 K/uL   nRBC 0.0 0.0 - 0.2 %   Neutrophils Relative % 64 %   Neutro Abs 3.8 1.7 - 7.7 K/uL   Lymphocytes Relative 21 %   Lymphs Abs 1.2 0.7 - 4.0 K/uL   Monocytes Relative 10 %   Monocytes Absolute 0.6 0.1 - 1.0 K/uL   Eosinophils Relative 4 %   Eosinophils Absolute 0.2 0.0 - 0.5 K/uL   Basophils Relative 1 %   Basophils Absolute 0.1 0.0 - 0.1 K/uL   Immature Granulocytes 0 %   Abs Immature Granulocytes 0.02 0.00 - 0.07 K/uL    Comment: Performed at [facility] [address], [place], [place] [address]  Comprehensive metabolic panel     Status: Abnormal   Collection Time: 03/09/21 10:23 PM  Result Value Ref Range   Sodium 135 135 - 145 mmol/L   Potassium 3.1 (L) 3.5 - 5.1 mmol/L   Chloride 97 (L) 98 - 111 mmol/L   CO2 30 22 - 32 mmol/L   Glucose, Bld 137 (H) 70 - 99 mg/dL    Comment: Glucose reference range applies only to samples taken after fasting for at least 8 hours.   BUN 19 8 - 23 mg/dL   Creatinine, Ser 0.98 0.44 - 1.00 mg/dL   Calcium 9.1 8.9 - 10.3 mg/dL   Total Protein 6.4 (L) 6.5 - 8.1 g/dL   Albumin 3.5 3.5 - 5.0 g/dL   AST 50 (H) 15 - 41 U/L   ALT 52 (H) 0 - 44 U/L   Alkaline Phosphatase 72 38 - 126 U/L   Total Bilirubin 0.8 0.3 - 1.2 mg/dL   GFR, Estimated 58 (L) >60 mL/min    Comment: (NOTE) Calculated using the CKD-EPI Creatinine Equation (2021)    Anion gap 8 5 - 15    Comment: Performed at [facility], [place] [address] [place], [place] [address]  ABO/Rh     Status: None   Collection Time: 03/09/21 10:23 PM  Result Value Ref Range   ABO/RH(D)      O POS Performed at [facility] [address] [place] [address], [place], [place] [address]   Resp Panel by RT-PCR (Flu A&B, Covid) Nasopharyngeal Swab     Status: None   Collection Time: 03/10/21  1:56 AM   Specimen: Nasopharyngeal Swab; Nasopharyngeal(NP) swabs in vial transport medium  Result Value  Ref Range   SARS Coronavirus 2 by RT PCR NEGATIVE NEGATIVE    Comment: (NOTE) SARS-CoV-2 target nucleic acids are NOT DETECTED.  The SARS-CoV-2 RNA is generally detectable  in upper respiratory specimens during the acute phase of infection. The lowest concentration of SARS-CoV-2 viral copies this assay can detect is 138 copies/mL. A negative result does not preclude SARS-Cov-2 infection and should not be used as the sole basis for treatment or other patient management decisions. A negative result may occur with  improper specimen collection/handling, submission of specimen other than nasopharyngeal swab, presence of viral mutation(s) within the areas targeted by this assay, and inadequate number of viral copies(<138 copies/mL). A negative result must be combined with clinical observations, patient history, and epidemiological information. The expected result is Negative.  Fact Sheet for Patients:  BloggerCourse.com  Fact Sheet for Healthcare Providers:  SeriousBroker.it  This test is no t yet approved or cleared by the [place] FDA and  has been authorized for detection and/or diagnosis of SARS-CoV-2 by FDA under an Emergency Use Authorization (EUA). This EUA will remain  in effect (meaning this test can be used) for the duration of the COVID-19 declaration under Section 564(b)(1) of the Act, 21 U.S.C.section 360bbb-3(b)(1), unless the authorization is terminated  or revoked sooner.       Influenza A by PCR NEGATIVE NEGATIVE   Influenza B by PCR NEGATIVE NEGATIVE    Comment: (NOTE) The Xpert Xpress SARS-CoV-2/FLU/RSV plus assay is intended as an aid in the diagnosis of influenza from Nasopharyngeal swab specimens and should not be used as a sole basis for treatment. Nasal washings and aspirates are unacceptable for Xpert Xpress SARS-CoV-2/FLU/RSV testing.  Fact Sheet for  Patients: BloggerCourse.com  Fact Sheet for Healthcare Providers: SeriousBroker.it  This test is not yet approved or cleared by the [place] FDA and has been authorized for detection and/or diagnosis of SARS-CoV-2 by FDA under an Emergency Use Authorization (EUA). This EUA will remain in effect (meaning this test can be used) for the duration of the COVID-19 declaration under Section 564(b)(1) of the Act, 21 U.S.C. section 360bbb-3(b)(1), unless the authorization is terminated or revoked.  Performed at [facility], [address], [place], [place] [address]   Basic metabolic panel     Status: Abnormal   Collection Time: 03/10/21  4:01 AM  Result Value Ref Range   Sodium 137 135 - 145 mmol/L   Potassium 2.8 (L) 3.5 - 5.1 mmol/L   Chloride 95 (L) 98 - 111 mmol/L   CO2 31 22 - 32 mmol/L   Glucose, Bld 124 (H) 70 - 99 mg/dL    Comment: Glucose reference range applies only to samples taken after fasting for at least 8 hours.   BUN 19 8 - 23 mg/dL   Creatinine, Ser 3.38 0.44 - 1.00 mg/dL   Calcium 8.9 8.9 - 32.9 mg/dL   GFR, Estimated 59 (L) >60 mL/min    Comment: (NOTE) Calculated using the CKD-EPI Creatinine Equation (2021)    Anion gap 11 5 - 15    Comment: Performed at [facility], [address], [place], [place] [address]  Magnesium     Status: None   Collection Time: 03/10/21  4:01 AM  Result Value Ref Range   Magnesium 2.1 1.7 - 2.4 mg/dL    Comment: Performed at [facility], [address] [place], [place] [address]  Type and screen [facility]     Status: None   Collection Time: 03/10/21 12:15 PM  Result Value Ref Range   ABO/RH(D) O POS    Antibody Screen NEG    Sample Expiration  03/13/2021,2359 Performed at [facility] [address], [place], [place] [address]   APTT     Status: Abnormal   Collection Time: 03/10/21  7:41 PM  Result Value Ref Range   aPTT  >200 (HH) 24 - 36 seconds    Comment:        IF BASELINE aPTT IS ELEVATED, SUGGEST PATIENT RISK ASSESSMENT BE USED TO DETERMINE APPROPRIATE ANTICOAGULANT THERAPY. REPEATED TO VERIFY SPECIMEN CHECKED FOR CLOTS CRITICAL RESULT CALLED TO, READ BACK BY AND VERIFIED WITH: Isidoro Donning RN 2026 03/10/21 LIN AUNG Performed at [facility], [place] [address] [place], [place] [address]   Heparin level (unfractionated)     Status: Abnormal   Collection Time: 03/10/21  7:41 PM  Result Value Ref Range   Heparin Unfractionated >1.10 (H) 0.30 - 0.70 IU/mL    Comment: (NOTE) The clinical reportable range upper limit is being lowered to >1.10 to align with the FDA approved guidance for the current laboratory assay.  If heparin results are below expected values, and patient dosage has  been confirmed, suggest follow up testing of antithrombin III levels. Performed at [facility], [place] [address] [place], [place] [address]   APTT     Status: Abnormal   Collection Time: 03/10/21 10:22 PM  Result Value Ref Range   aPTT >200 (HH) 24 - 36 seconds    Comment:        IF BASELINE aPTT IS ELEVATED, SUGGEST PATIENT RISK ASSESSMENT BE USED TO DETERMINE APPROPRIATE ANTICOAGULANT THERAPY. REPEATED TO VERIFY CRITICAL RESULT CALLED TO, READ BACK BY AND VERIFIED WITH: Rande Brunt, RN 03/10/2021 2327 BTAYLOR Performed at [facility], [place] [address], [place], [place] [address]   Heparin level (unfractionated)     Status: Abnormal   Collection Time: 03/10/21 10:22 PM  Result Value Ref Range   Heparin Unfractionated >1.10 (H) 0.30 - 0.70 IU/mL    Comment: (NOTE) The clinical reportable range upper limit is being lowered to >1.10 to align with the FDA approved guidance for the current laboratory assay.  If heparin results are below expected values, and patient dosage has  been confirmed, suggest follow up testing of antithrombin III levels. Performed at [facility], [place] [address] [place], [place] [address]   Magnesium     Status: None   Collection Time: 03/11/21 12:44 AM  Result Value Ref Range   Magnesium 1.9 1.7 - 2.4 mg/dL    Comment: Performed at [facility] [address] [place], [place] [address]  Comprehensive metabolic panel     Status: Abnormal   Collection Time: 03/11/21 12:44 AM  Result Value Ref Range   Sodium 139 135 - 145 mmol/L   Potassium 4.2 3.5 - 5.1 mmol/L    Comment: DELTA CHECK NOTED   Chloride 103 98 - 111 mmol/L   CO2 29 22 - 32 mmol/L   Glucose, Bld 78 70 - 99 mg/dL    Comment: Glucose reference range applies only to samples taken after fasting for at least 8 hours.   BUN 8 8 - 23 mg/dL   Creatinine, Ser 0.74 0.44 - 1.00 mg/dL   Calcium 8.7 (L) 8.9 - 10.3 mg/dL   Total Protein 5.2 (L) 6.5 - 8.1 g/dL   Albumin 2.8 (L) 3.5 - 5.0 g/dL   AST 35 15 - 41 U/L   ALT 38 0 - 44 U/L   Alkaline Phosphatase 63 38 - 126 U/L  Total Bilirubin 0.5 0.3 - 1.2 mg/dL   GFR, Estimated >35 >57 mL/min    Comment: (NOTE) Calculated using the CKD-EPI Creatinine Equation (2021)    Anion gap 7 5 - 15    Comment: Performed at [facility], [address] [place], [place] [address]  Brain natriuretic peptide     Status: Abnormal   Collection Time: 03/11/21 12:44 AM  Result Value Ref Range   B Natriuretic Peptide 326.4 (H) 0.0 - 100.0 pg/mL    Comment: Performed at [facility], [address] [place], [place] [address]  CBC     Status: Abnormal   Collection Time: 03/11/21 12:44 AM  Result Value Ref Range   WBC 4.5 4.0 - 10.5 K/uL   RBC 3.09 (L) 3.87 - 5.11 MIL/uL   Hemoglobin 9.5 (L) 12.0 - 15.0 g/dL   HCT 62.3 (L) 76.2 - 83.1 %   MCV 96.1 80.0 - 100.0 fL   MCH 30.7 26.0 - 34.0 pg   MCHC 32.0 30.0 - 36.0 g/dL   RDW 51.7 (H) 61.6 - 07.3 %   Platelets 146 (L) 150 - 400 K/uL   nRBC 0.0 0.0 - 0.2 %    Comment: Performed at [facility], [address], [place], [place] [address]  Surgical pcr screen     Status:  Abnormal   Collection Time: 03/11/21  6:12 AM   Specimen: Nasal Mucosa; Nasal Swab  Result Value Ref Range   MRSA, PCR NEGATIVE NEGATIVE   Staphylococcus aureus POSITIVE (A) NEGATIVE    Comment: (NOTE) The Xpert SA Assay (FDA approved for NASAL specimens in patients 54 years of age and older), is one component of a comprehensive surveillance program. It is not intended to diagnose infection nor to guide or monitor treatment. Performed at [facility], [address], [place], [place] [address]     Imaging / Studies: CT CHEST ABDOMEN PELVIS W CONTRAST  Result Date: 03/10/2021 CLINICAL DATA:  Chest pain.  History of hernia. EXAM: CT CHEST, ABDOMEN, AND PELVIS WITH CONTRAST TECHNIQUE: Multidetector CT imaging of the chest, abdomen and pelvis was performed following the standard protocol during bolus administration of intravenous contrast. CONTRAST:  OMNIPAQUE IOHEXOL 300 MG/ML  SOLN COMPARISON:  02/11/2021. FINDINGS: CT CHEST FINDINGS Cardiovascular: The heart is normal in size and there is no pericardial effusion. Scattered coronary artery calcifications are noted. There is a left-sided pacemaker with leads terminating in the heart. There is mild atherosclerotic calcification of the aorta without evidence of aneurysm. Pulmonary trunk is normal in caliber. Mediastinum/Nodes: No enlarged mediastinal, hilar, or axillary lymph nodes. Thyroid gland, trachea, and esophagus demonstrate no significant findings. There is a large hiatal hernia, unchanged from the prior exam. Lungs/Pleura: Apical pleural scarring and mild subpleural fibrosis is noted bilaterally. Mild atelectasis is present in the lower lobes bilaterally adjacent to the hernia pouch. No effusion or pneumothorax. Musculoskeletal: An old rib fracture is noted at T10 on the right. Degenerative changes are present in the thoracic spine. A compression deformity is present in the inferior endplate of T12 which is unchanged from the prior  exam. CT ABDOMEN PELVIS FINDINGS Hepatobiliary: No focal liver abnormality is seen. There is mild biliary ductal dilatation which is likely related to patient's post cholecystectomy status. Pancreas: Unremarkable. No pancreatic ductal dilatation or surrounding inflammatory changes. Spleen: Normal in size without focal abnormality. Adrenals/Urinary Tract: Adrenal glands are unremarkable. Kidneys are normal, without renal calculi, focal lesion, or hydronephrosis.  Bladder is unremarkable. Stomach/Bowel: There is a large hiatal hernia with the gastric body and antrum intrathoracic in location. Air-fluid levels are noted in the stomach. The gastric fundus and proximal body are intra-abdominal in location. Scattered diverticula are present along the colon without evidence of diverticulitis. The appendix is not visualized on exam. No free air or pneumatosis is seen. Vascular/Lymphatic: Aortic atherosclerosis. No enlarged abdominal or pelvic lymph nodes. Reproductive: Uterus and bilateral adnexa are unremarkable. Other: No free fluid. A small fat containing inguinal hernia is noted on the left. Musculoskeletal: Degenerative changes in the lumbar spine. No acute osseous abnormality is seen. IMPRESSION: 1. Large hiatal hernia with the gastric body and antrum intrathoracic in location and air-fluid levels in the stomach, unchanged from the previous exam. Findings are concerning for gastric volvulus or early obstruction. Surgical consultation is recommended. 2. Coronary artery calcifications. 3. Stable compression deformity in the inferior endplate of 624THL. Electronically Signed   By: Brett Fairy M.D.   On: 03/10/2021 01:02   DG Chest Port 1 View  Result Date: 03/09/2021 CLINICAL DATA:  Chest pain EXAM: PORTABLE CHEST 1 VIEW COMPARISON:  02/16/2021 FINDINGS: Cardiac shadow is stable. Pacing device is again noted. Large hiatal hernia is again seen. Lungs are clear bilaterally. No bony abnormality is noted. IMPRESSION:  Stable large hiatal hernia.  No new focal abnormality is noted. Electronically Signed   By: Inez Catalina M.D.   On: 03/09/2021 23:17   ECHOCARDIOGRAM COMPLETE  Result Date: 03/10/2021    ECHOCARDIOGRAM REPORT   Patient Name:   TALAN CARTWRIGHT Date of Exam: 03/10/2021 Medical Rec #:  [id number]   Height:       62.0 in Accession #:    [id number]  Weight:       158.3 lb Date of Birth:  1940-08-31   BSA:          1.731 m Patient Age:    89 years    BP:           118/58 mmHg Patient Gender: F           HR:           60 bpm. Exam Location:  Inpatient Procedure: Cardiac Doppler, Color Doppler, 3D Echo and 2D Echo STAT ECHO Indications:    CHF  History:        Patient has no prior history of Echocardiogram examinations.                 CHF; Arrythmias:Atrial Fibrillation.  Sonographer:    Glo Herring Referring Phys: [place]  1. Left ventricular ejection fraction, by estimation, is 60 to 65%. The left ventricle has normal function. The left ventricle has no regional wall motion abnormalities. Left ventricular diastolic parameters are consistent with Grade II diastolic dysfunction (pseudonormalization).  2. Right ventricular systolic function is normal. The right ventricular size is normal.  3. Left atrial size was moderate to severe.  4. Right atrial size was moderately dilated.  5. The mitral valve is grossly normal. Mild mitral valve regurgitation.  6. Focal calcification. P1/2 T 501 ms. The aortic valve is calcified. Aortic valve regurgitation is mild to moderate.  7. The inferior vena cava is normal in size with greater than 50% respiratory variability, suggesting right atrial pressure of 3 mmHg. Comparison(s): No prior Echocardiogram. FINDINGS  Left Ventricle: Left ventricular ejection fraction, by estimation, is 60 to 65%. The left ventricle has normal function. The left ventricle has no regional wall  motion abnormalities. The left ventricular internal cavity size was normal in size. There  is  no left ventricular hypertrophy. Left ventricular diastolic parameters are consistent with Grade II diastolic dysfunction (pseudonormalization). Right Ventricle: The right ventricular size is normal. No increase in right ventricular wall thickness. Right ventricular systolic function is normal. Left Atrium: Left atrial size was moderate to severe. Right Atrium: Right atrial size was moderately dilated. Pericardium: There is no evidence of pericardial effusion. Mitral Valve: The mitral valve is grossly normal. Mild mitral annular calcification. Mild mitral valve regurgitation. MV peak gradient, 7.2 mmHg. The mean mitral valve gradient is 2.5 mmHg. Tricuspid Valve: The tricuspid valve is grossly normal. Tricuspid valve regurgitation is mild. Aortic Valve: Focal calcification. P1/2 T 501 ms. The aortic valve is calcified. Aortic valve regurgitation is mild to moderate. Aortic regurgitation PHT measures 501 msec. Aortic valve mean gradient measures 4.0 mmHg. Aortic valve peak gradient measures  6.7 mmHg. Aortic valve area, by VTI measures 1.89 cm. Pulmonic Valve: The pulmonic valve was grossly normal. Pulmonic valve regurgitation is mild. Aorta: The aortic root and ascending aorta are structurally normal, with no evidence of dilitation. Venous: The inferior vena cava is normal in size with greater than 50% respiratory variability, suggesting right atrial pressure of 3 mmHg. IAS/Shunts: No atrial level shunt detected by color flow Doppler. Additional Comments: A device lead is visualized.  LEFT VENTRICLE PLAX 2D LVIDd:         5.00 cm     Diastology LVIDs:         3.20 cm     LV e' medial:    6.62 cm/s LV PW:         0.80 cm     LV E/e' medial:  16.5 LV IVS:        0.90 cm     LV e' lateral:   7.37 cm/s LVOT diam:     2.00 cm     LV E/e' lateral: 14.8 LV SV:         63 LV SV Index:   36 LVOT Area:     3.14 cm  LV Volumes (MOD) LV vol d, MOD A2C: 77.8 ml LV vol d, MOD A4C: 95.6 ml LV vol s, MOD A2C: 32.5 ml LV vol  s, MOD A4C: 35.4 ml LV SV MOD A2C:     45.3 ml LV SV MOD A4C:     95.6 ml LV SV MOD BP:      52.9 ml RIGHT VENTRICLE             IVC RV Basal diam:  3.70 cm     IVC diam: 1.70 cm RV S prime:     12.80 cm/s LEFT ATRIUM             Index        RIGHT ATRIUM           Index LA diam:        3.90 cm 2.25 cm/m   RA Area:     18.40 cm LA Vol (A2C):   89.2 ml 51.54 ml/m  RA Volume:   45.40 ml  26.23 ml/m LA Vol (A4C):   67.8 ml 39.17 ml/m LA Biplane Vol: 80.4 ml 46.45 ml/m  AORTIC VALVE                    PULMONIC VALVE AV Area (Vmax):    1.88 cm     PV Vmax:  0.74 m/s AV Area (Vmean):   1.74 cm     PV Peak grad:  2.2 mmHg AV Area (VTI):     1.89 cm AV Vmax:           129.00 cm/s AV Vmean:          90.600 cm/s AV VTI:            0.331 m AV Peak Grad:      6.7 mmHg AV Mean Grad:      4.0 mmHg LVOT Vmax:         77.00 cm/s LVOT Vmean:        50.200 cm/s LVOT VTI:          0.199 m LVOT/AV VTI ratio: 0.60 AI PHT:            501 msec  AORTA Ao Root diam: 3.20 cm Ao Asc diam:  3.60 cm MITRAL VALVE                TRICUSPID VALVE MV Area (PHT): 3.36 cm     TR Peak grad:   26.2 mmHg MV Area VTI:   1.52 cm     TR Vmax:        256.00 cm/s MV Peak grad:  7.2 mmHg MV Mean grad:  2.5 mmHg     SHUNTS MV Vmax:       1.34 m/s     Systemic VTI:  0.20 m MV Vmean:      73.4 cm/s    Systemic Diam: 2.00 cm MV Decel Time: 226 msec MR PISA:        2.26 cm MR PISA Radius: 0.60 cm MV E velocity: 109.00 cm/s MV A velocity: 60.40 cm/s MV E/A ratio:  1.80 Landscape architect signed by Phineas Inches Signature Date/Time: 03/10/2021/12:53:06 PM    Final     Medications / Allergies: per chart  Antibiotics: Anti-infectives (From admission, onward)    None         Note: Portions of this report may have been transcribed using voice recognition software. Every effort was made to ensure accuracy; however, inadvertent computerized transcription errors may be present.   Any transcriptional errors that result from this process  are unintentional.    Adin Hector, MD, FACS, MASCRS Esophageal, Gastrointestinal & Colorectal Surgery Robotic and Minimally Invasive Surgery  [facility], [place]  [place]. [address], [place], [place] [address] [phone] Fax [phone] Main  CONTACT INFORMATION:  Weekday (9AM-5PM): Call [facility] main office at [phone]  Weeknight (5PM-9AM) or Weekend/Holiday: Check [email or web address] (password " [email or web address]") for General Surgery [facility] coverage  (Please, do not use SecureChat as it is not reliable communication to operating surgeons for immediate patient care)      03/11/2021  10:18 AM

## 2021-03-11 NOTE — Progress Notes (Addendum)
PROGRESS NOTE                                                                                                                                                                                                             Patient Demographics:    Danielle Blanchard, is a 80 y.o. female, DOB - Mar 05, 1941, [id number]  Outpatient Primary MD for the patient is [facility]    LOS - 1  Admit date - 03/09/2021    Chief Complaint  Patient presents with   Chest Pain   Abdominal Pain       Brief Narrative (HPI from H&P)  Danielle Blanchard is a 80 y.o. 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   Subjective:   Patient in bed, appears comfortable, denies any headache, no fever, no chest pain or pressure, no shortness of breath , no abdominal pain. No new focal weakness.    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  She has been treated conservatively with bowel rest and IV fluids, her symptoms are much better and abdominal exam now is benign, she is passing some flatus and known nausea at this time.  Plan is n.p.o. except medications, IV fluids, prepare for surgical repair of her hiatal hernia, general surgery GI following.  Case discussed with general surgery PA Saverio Danker on 03/10/2021.  Of note patient will  be a moderate to high risk for adverse cardiopulmonary outcome during the perioperative period, this is due to her advanced age and underlying medical issues, risks and benefits were discussed with patient and patient's son who accepts the risk and want to proceed for surgical intervention.   2.  Paroxysmal atrial fibrillation.  Mali vas 2 score of greater than 4.  Heparin drip instead of Eliquis, continue Coreg and  amiodarone orally with a sip of water  3.  Hypertension.  Continue Coreg along with as needed IV Lopressor and hydralazine.  4.  History of sick sinus syndrome.  S/p pacemaker placement.  5.  Chronic diastolic CHF EF [id number] last echocardiogram at [facility].  Repeat echo remains stable.    6.  Severe hypokalemia.  Replaced IV will monitor.         Condition - Guarded  Family Communication  :  son Danielle Blanchard [phone] - on 03/10/21  Code Status :  Full  Consults  :  CCS  PUD Prophylaxis : PPI   Procedures  :     TTE - 1. Left ventricular ejection fraction, by estimation, is 60 to 65%. The left ventricle has normal function. The left ventricle has no regional wall motion abnormalities. Left ventricular diastolic parameters are consistent with Grade II diastolic dysfunction (pseudonormalization).  2. Right ventricular systolic function is normal. The right ventricular size is normal.  3. Left atrial size was moderate to severe.  4. Right atrial size was moderately dilated.  5. The mitral valve is grossly normal. Mild mitral valve regurgitation.  6. Focal calcification. P1/2 T 501 ms. The aortic valve is calcified. Aortic valve regurgitation is mild to moderate.  7. The inferior vena cava is normal in size with greater than 50% respiratory variability, suggesting right atrial pressure of 3 mmHg. Comparison(s): No prior Echocardiogram  CT - 1. Large hiatal hernia with the gastric body and antrum intrathoracic in location and air-fluid levels in the stomach, unchanged from the previous  exam. Findings are concerning for gastric volvulus or early obstruction. Surgical consultation is recommended. 2. Coronary artery calcifications. 3. Stable compression deformity in the inferior endplate of 624THL.       Disposition Plan  :    Status is: Inpatient  Remains inpatient appropriate because: GI Volvulus  DVT Prophylaxis  :    SCDs Start: 03/10/21 0229    Lab Results  Component Value Date   PLT 146 (L) 03/11/2021    Diet :  Diet Order             Diet NPO time specified Except for: Sips with Meds  Diet effective now                    Inpatient Medications  Scheduled Meds:  amiodarone  200 mg Oral Daily   carvedilol  3.125 mg Oral BID WC   levothyroxine  75 mcg Oral Q0600   pantoprazole (PROTONIX) IV  40 mg Intravenous Q24H   rosuvastatin  20 mg Oral Daily   Continuous Infusions:  heparin 700 Units/hr (03/11/21 0053)   lactated ringers 75 mL/hr at 03/10/21 2325   promethazine (PHENERGAN) injection (IM or IVPB)     PRN Meds:.hydrALAZINE, metoprolol tartrate, morphine injection, promethazine (PHENERGAN) injection (IM or IVPB)  Antibiotics  :    Anti-infectives (From admission, onward)    None        Time Spent in minutes  30   Lala Lund M.D on 03/11/2021 at 10:46 AM  To page go to [email or web address]   [facility] -  Office  [phone]  See all Orders from today for further details    Objective:   Vitals:   03/10/21 2102 03/11/21 0110 03/11/21 0427 03/11/21 0918  BP: 136/61 (!) 118/54 (!) 135/54 129/60  Pulse: 60 (!) 59 65 64  Resp: 16 16 17 18   Temp: 98.5 F (36.9 C) 98.2 F (36.8 C) 98.2  F (36.8 C) 98.3 F (36.8 C)  TempSrc: Oral Oral Oral Oral  SpO2: 95% 95% 97% 94%  Weight:      Height:        Wt Readings from Last 3 Encounters:  03/09/21 71.8 kg  02/13/21 71.8 kg  07/22/19 65.8 kg     Intake/Output Summary (Last 24 hours) at 03/11/2021 1046 Last data filed at 03/10/2021 1829 Gross per 24 hour  Intake  94.93 ml  Output 700 ml  Net -605.07 ml     Physical Exam  Awake Alert, No new F.N deficits, Normal affect [place].AT,[name] Supple Neck, No JVD,   Symmetrical Chest wall movement, Good air movement bilaterally, CTAB RRR,No Gallops,Rubs or new Murmurs,  +ve B.Sounds, Abd Soft, No tenderness,   No Cyanosis, Clubbing or edema      Data Review:    CBC Recent Labs  Lab 03/09/21 2223 03/11/21 0044  WBC 5.9 4.5  HGB 10.4* 9.5*  HCT 31.9* 29.7*  PLT 189 146*  MCV 96.1 96.1  MCH 31.3 30.7  MCHC 32.6 32.0  RDW 15.3 15.6*  LYMPHSABS 1.2  --   MONOABS 0.6  --   EOSABS 0.2  --   BASOSABS 0.1  --     Electrolytes Recent Labs  Lab 03/09/21 2223 03/10/21 0401 03/11/21 0044  NA 135 137 139  K 3.1* 2.8* 4.2  CL 97* 95* 103  CO2 30 31 29   GLUCOSE 137* 124* 78  BUN 19 19 8   CREATININE 0.98 0.96 0.74  CALCIUM 9.1 8.9 8.7*  AST 50*  --  35  ALT 52*  --  38  ALKPHOS 72  --  63  BILITOT 0.8  --  0.5  ALBUMIN 3.5  --  2.8*  MG  --  2.1 1.9  BNP  --   --  326.4*    ------------------------------------------------------------------------------------------------------------------ No results for input(s): CHOL, HDL, LDLCALC, TRIG, CHOLHDL, LDLDIRECT in the last 72 hours.  No results found for: HGBA1C  No results for input(s): TSH, T4TOTAL, T3FREE, THYROIDAB in the last 72 hours.  Invalid input(s): FREET3 ------------------------------------------------------------------------------------------------------------------ ID Labs Recent Labs  Lab 03/09/21 2223 03/10/21 0401 03/11/21 0044  WBC 5.9  --  4.5  PLT 189  --  146*  CREATININE 0.98 0.96 0.74   Cardiac Enzymes No results for input(s): CKMB, TROPONINI, MYOGLOBIN in the last 168 hours.  Invalid input(s): CK   Radiology Reports CT CHEST ABDOMEN PELVIS W CONTRAST  Result Date: 03/10/2021 CLINICAL DATA:  Chest pain.  History of hernia. EXAM: CT CHEST, ABDOMEN, AND PELVIS WITH CONTRAST TECHNIQUE: Multidetector CT  imaging of the chest, abdomen and pelvis was performed following the standard protocol during bolus administration of intravenous contrast. CONTRAST:  14/10/22 OMNIPAQUE IOHEXOL 300 MG/ML  SOLN COMPARISON:  02/11/2021. FINDINGS: CT CHEST FINDINGS Cardiovascular: The heart is normal in size and there is no pericardial effusion. Scattered coronary artery calcifications are noted. There is a left-sided pacemaker with leads terminating in the heart. There is mild atherosclerotic calcification of the aorta without evidence of aneurysm. Pulmonary trunk is normal in caliber. Mediastinum/Nodes: No enlarged mediastinal, hilar, or axillary lymph nodes. Thyroid gland, trachea, and esophagus demonstrate no significant findings. There is a large hiatal hernia, unchanged from the prior exam. Lungs/Pleura: Apical pleural scarring and mild subpleural fibrosis is noted bilaterally. Mild atelectasis is present in the lower lobes bilaterally adjacent to the hernia pouch. No effusion or pneumothorax. Musculoskeletal: An old rib fracture is noted at T10 on the right.  Degenerative changes are present in the thoracic spine. A compression deformity is present in the inferior endplate of 624THL which is unchanged from the prior exam. CT ABDOMEN PELVIS FINDINGS Hepatobiliary: No focal liver abnormality is seen. There is mild biliary ductal dilatation which is likely related to patient's post cholecystectomy status. Pancreas: Unremarkable. No pancreatic ductal dilatation or surrounding inflammatory changes. Spleen: Normal in size without focal abnormality. Adrenals/Urinary Tract: Adrenal glands are unremarkable. Kidneys are normal, without renal calculi, focal lesion, or hydronephrosis. Bladder is unremarkable. Stomach/Bowel: There is a large hiatal hernia with the gastric body and antrum intrathoracic in location. Air-fluid levels are noted in the stomach. The gastric fundus and proximal body are intra-abdominal in location. Scattered diverticula  are present along the colon without evidence of diverticulitis. The appendix is not visualized on exam. No free air or pneumatosis is seen. Vascular/Lymphatic: Aortic atherosclerosis. No enlarged abdominal or pelvic lymph nodes. Reproductive: Uterus and bilateral adnexa are unremarkable. Other: No free fluid. A small fat containing inguinal hernia is noted on the left. Musculoskeletal: Degenerative changes in the lumbar spine. No acute osseous abnormality is seen. IMPRESSION: 1. Large hiatal hernia with the gastric body and antrum intrathoracic in location and air-fluid levels in the stomach, unchanged from the previous exam. Findings are concerning for gastric volvulus or early obstruction. Surgical consultation is recommended. 2. Coronary artery calcifications. 3. Stable compression deformity in the inferior endplate of 624THL. Electronically Signed   By: Brett Fairy M.D.   On: 03/10/2021 01:02   DG Chest Port 1 View  Result Date: 03/09/2021 CLINICAL DATA:  Chest pain EXAM: PORTABLE CHEST 1 VIEW COMPARISON:  02/16/2021 FINDINGS: Cardiac shadow is stable. Pacing device is again noted. Large hiatal hernia is again seen. Lungs are clear bilaterally. No bony abnormality is noted. IMPRESSION: Stable large hiatal hernia.  No new focal abnormality is noted. Electronically Signed   By: Inez Catalina M.D.   On: 03/09/2021 23:17   ECHOCARDIOGRAM COMPLETE  Result Date: 03/10/2021    ECHOCARDIOGRAM REPORT   Patient Name:   GIRL LORIO Date of Exam: 03/10/2021 Medical Rec #:  [id number]   Height:       62.0 in Accession #:    [id number]  Weight:       158.3 lb Date of Birth:  1941-01-17   BSA:          1.731 m Patient Age:    38 years    BP:           118/58 mmHg Patient Gender: F           HR:           60 bpm. Exam Location:  Inpatient Procedure: Cardiac Doppler, Color Doppler, 3D Echo and 2D Echo STAT ECHO Indications:    CHF  History:        Patient has no prior history of Echocardiogram examinations.                  CHF; Arrythmias:Atrial Fibrillation.  Sonographer:    Glo Herring Referring Phys: [place]  1. Left ventricular ejection fraction, by estimation, is 60 to 65%. The left ventricle has normal function. The left ventricle has no regional wall motion abnormalities. Left ventricular diastolic parameters are consistent with Grade II diastolic dysfunction (pseudonormalization).  2. Right ventricular systolic function is normal. The right ventricular size is normal.  3. Left atrial size was moderate to severe.  4. Right atrial size was moderately dilated.  5. The mitral valve is grossly normal. Mild mitral valve regurgitation.  6. Focal calcification. P1/2 T 501 ms. The aortic valve is calcified. Aortic valve regurgitation is mild to moderate.  7. The inferior vena cava is normal in size with greater than 50% respiratory variability, suggesting right atrial pressure of 3 mmHg. Comparison(s): No prior Echocardiogram. FINDINGS  Left Ventricle: Left ventricular ejection fraction, by estimation, is 60 to 65%. The left ventricle has normal function. The left ventricle has no regional wall motion abnormalities. The left ventricular internal cavity size was normal in size. There is  no left ventricular hypertrophy. Left ventricular diastolic parameters are consistent with Grade II diastolic dysfunction (pseudonormalization). Right Ventricle: The right ventricular size is normal. No increase in right ventricular wall thickness. Right ventricular systolic function is normal. Left Atrium: Left atrial size was moderate to severe. Right Atrium: Right atrial size was moderately dilated. Pericardium: There is no evidence of pericardial effusion. Mitral Valve: The mitral valve is grossly normal. Mild mitral annular calcification. Mild mitral valve regurgitation. MV peak gradient, 7.2 mmHg. The mean mitral valve gradient is 2.5 mmHg. Tricuspid Valve: The tricuspid valve is grossly normal. Tricuspid valve  regurgitation is mild. Aortic Valve: Focal calcification. P1/2 T 501 ms. The aortic valve is calcified. Aortic valve regurgitation is mild to moderate. Aortic regurgitation PHT measures 501 msec. Aortic valve mean gradient measures 4.0 mmHg. Aortic valve peak gradient measures  6.7 mmHg. Aortic valve area, by VTI measures 1.89 cm. Pulmonic Valve: The pulmonic valve was grossly normal. Pulmonic valve regurgitation is mild. Aorta: The aortic root and ascending aorta are structurally normal, with no evidence of dilitation. Venous: The inferior vena cava is normal in size with greater than 50% respiratory variability, suggesting right atrial pressure of 3 mmHg. IAS/Shunts: No atrial level shunt detected by color flow Doppler. Additional Comments: A device lead is visualized.  LEFT VENTRICLE PLAX 2D LVIDd:         5.00 cm     Diastology LVIDs:         3.20 cm     LV e' medial:    6.62 cm/s LV PW:         0.80 cm     LV E/e' medial:  16.5 LV IVS:        0.90 cm     LV e' lateral:   7.37 cm/s LVOT diam:     2.00 cm     LV E/e' lateral: 14.8 LV SV:         63 LV SV Index:   36 LVOT Area:     3.14 cm  LV Volumes (MOD) LV vol d, MOD A2C: 77.8 ml LV vol d, MOD A4C: 95.6 ml LV vol s, MOD A2C: 32.5 ml LV vol s, MOD A4C: 35.4 ml LV SV MOD A2C:     45.3 ml LV SV MOD A4C:     95.6 ml LV SV MOD BP:      52.9 ml RIGHT VENTRICLE             IVC RV Basal diam:  3.70 cm     IVC diam: 1.70 cm RV S prime:     12.80 cm/s LEFT ATRIUM             Index        RIGHT ATRIUM           Index LA diam:        3.90 cm 2.25 cm/m  RA Area:     18.40 cm LA Vol (A2C):   89.2 ml 51.54 ml/m  RA Volume:   45.40 ml  26.23 ml/m LA Vol (A4C):   67.8 ml 39.17 ml/m LA Biplane Vol: 80.4 ml 46.45 ml/m  AORTIC VALVE                    PULMONIC VALVE AV Area (Vmax):    1.88 cm     PV Vmax:       0.74 m/s AV Area (Vmean):   1.74 cm     PV Peak grad:  2.2 mmHg AV Area (VTI):     1.89 cm AV Vmax:           129.00 cm/s AV Vmean:          90.600 cm/s AV  VTI:            0.331 m AV Peak Grad:      6.7 mmHg AV Mean Grad:      4.0 mmHg LVOT Vmax:         77.00 cm/s LVOT Vmean:        50.200 cm/s LVOT VTI:          0.199 m LVOT/AV VTI ratio: 0.60 AI PHT:            501 msec  AORTA Ao Root diam: 3.20 cm Ao Asc diam:  3.60 cm MITRAL VALVE                TRICUSPID VALVE MV Area (PHT): 3.36 cm     TR Peak grad:   26.2 mmHg MV Area VTI:   1.52 cm     TR Vmax:        256.00 cm/s MV Peak grad:  7.2 mmHg MV Mean grad:  2.5 mmHg     SHUNTS MV Vmax:       1.34 m/s     Systemic VTI:  0.20 m MV Vmean:      73.4 cm/s    Systemic Diam: 2.00 cm MV Decel Time: 226 msec MR PISA:        2.26 cm MR PISA Radius: 0.60 cm MV E velocity: 109.00 cm/s MV A velocity: 60.40 cm/s MV E/A ratio:  1.80 Landscape architect signed by Phineas Inches Signature Date/Time: 03/10/2021/12:53:06 PM    Final

## 2021-03-11 NOTE — Evaluation (Signed)
Physical Therapy Evaluation Patient Details Name: Danielle Blanchard MRN: [id number] DOB: Jan 28, 1941 Today's Date: 03/11/2021  History of Present Illness  80 y.o female with medical history significant of sick sinus syndrome post PPM, chronic diastolic CHF, hypertension, hyperlipidemia, GERD.  Recently admitted on 11/12 for left upper quadrant abdominal pain.  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  Patient was seen by GI and underwent decompression by endoscopy on 11/14.  She returns to the ED with left-sided chest pain.  Chest x-ray showing stable large hiatal hernia.  CT chest/abdomen/pelvis showing large hiatal hernia.  GI considering repeat surgical evaluation.  Clinical Impression  Pt admitted with above diagnosis. Pt presents with hiatal hernia from home with son where she is alone during the day, has 17 steps to get up to apt, and has STM deficits at baseline. Pt unsteady with initial ambulation without AD. Given RW and ambulated 150' with no LOB. Will follow to make sure she maintains mobility if she has surgery again.  Pt currently with functional limitations due to the deficits listed below (see PT Problem List). Pt will benefit from skilled PT to increase their independence and safety with mobility to allow discharge to the venue listed below.          Recommendations for follow up therapy are one component of a multi-disciplinary discharge planning process, led by the attending physician.  Recommendations may be updated based on patient status, additional functional criteria and insurance authorization.  Follow Up Recommendations No PT follow up    Assistance Recommended at Discharge Intermittent Supervision/Assistance  Functional Status Assessment Patient has had a recent decline in their functional status and demonstrates the ability to make significant improvements in function in a reasonable and predictable amount of time.  Equipment  Recommendations  None recommended by PT    Recommendations for Other Services       Precautions / Restrictions Precautions Precautions: Fall Restrictions Weight Bearing Restrictions: No      Mobility  Bed Mobility Overal bed mobility: Needs Assistance Bed Mobility: Supine to Sit     Supine to sit: Supervision     General bed mobility comments: no physical assist needed to come to EOB    Transfers Overall transfer level: Needs assistance Equipment used: None Transfers: Sit to/from Stand Sit to Stand: Min guard           General transfer comment: pt cautious with standing, increased time needed. Min guard for safety    Ambulation/Gait Ambulation/Gait assistance: Min assist Gait Distance (Feet): 150 Feet Assistive device: Rolling walker (2 wheels);None Gait Pattern/deviations: Step-through pattern;Decreased stride length Gait velocity: decreased Gait velocity interpretation: 1.31 - 2.62 ft/sec, indicative of limited community ambulator   General Gait Details: pt unsteady with initial ambulation without AD, min A needed. Pt given RW and was able to ambulate with supervision and no LOB, occasional cues for staying within [employer]            Wheelchair Mobility    Modified Rankin (Stroke Patients Only)       Balance Overall balance assessment: Needs assistance Sitting-balance support: Feet supported Sitting balance-Leahy Scale: Good     Standing balance support: Single extremity supported Standing balance-Leahy Scale: Fair                               Pertinent Vitals/Pain Pain Assessment: No/denies pain    Home Living Family/patient expects to  be discharged to:: Private residence Living Arrangements: Children Available Help at Discharge: Family;Available PRN/intermittently Type of Home: Apartment Home Access: Stairs to enter Entrance Stairs-Rails: Right;Left Entrance Stairs-Number of Steps: 17   Home Layout: One  level Home Equipment: Agricultural consultant (2 wheels);Cane - single point Additional Comments: Son works first shift, and she's home alone during this time.    Prior Function Prior Level of Function : Independent/Modified Independent             Mobility Comments: Walks household distances without RW or SPC.  Increased effort for stairs. ADLs Comments: Patient completes her own showers, light meal prep.  Son sets up medications and assist with community mobility and bill payment.     Hand Dominance   Dominant Hand: Right    Extremity/Trunk Assessment   Upper Extremity Assessment Upper Extremity Assessment: Defer to OT evaluation    Lower Extremity Assessment Lower Extremity Assessment: Overall WFL for tasks assessed    Cervical / Trunk Assessment Cervical / Trunk Assessment: Normal  Communication   Communication: No difficulties  Cognition Arousal/Alertness: Awake/alert Behavior During Therapy: WFL for tasks assessed/performed Overall Cognitive Status: History of cognitive impairments - at baseline                                 General Comments: 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        General Comments General comments (skin integrity, edema, etc.): VSS    Exercises     Assessment/Plan    PT Assessment Patient needs continued PT services  PT Problem List Decreased balance;Decreased mobility;Decreased activity tolerance;Decreased cognition;Decreased knowledge of use of DME;Decreased safety awareness;Decreased knowledge of precautions       PT Treatment Interventions DME instruction;Gait training;Stair training;Functional mobility training;Therapeutic activities;Therapeutic exercise;Balance training;Patient/family education    PT Goals (Current goals can be found in the Care Plan section)  Acute Rehab PT Goals Patient Stated Goal: get some food PT Goal Formulation: With patient Time For Goal Achievement: 03/25/21 Potential to Achieve Goals:  Good    Frequency Min 3X/week   Barriers to discharge Decreased caregiver support;Inaccessible home environment home alone during the day and 17 STE home    Co-evaluation               AM-PAC PT "6 Clicks" Mobility  Outcome Measure Help needed turning from your back to your side while in a flat bed without using bedrails?: None Help needed moving from lying on your back to sitting on the side of a flat bed without using bedrails?: None Help needed moving to and from a bed to a chair (including a wheelchair)?: None Help needed standing up from a chair using your arms (e.g., wheelchair or bedside chair)?: A Little Help needed to walk in hospital room?: A Little Help needed climbing 3-5 steps with a railing? : A Little 6 Click Score: 21    End of Session   Activity Tolerance: Patient tolerated treatment well Patient left: with call bell/phone within reach;Other (comment) (toilet) Nurse Communication: Other (comment) (pt to pull cord when finished on toilet) PT Visit Diagnosis: Unsteadiness on feet (R26.81)    Time: 1696-7893 PT Time Calculation (min) (ACUTE ONLY): 20 min   Charges:   PT Evaluation $PT Eval Moderate Complexity: 1 Mod          Lyanne Co, PT  Acute Rehab Services  Pager [phone] Office [phone]   Lawana Chambers Kashvi Prevette 03/11/2021,  2:13 PM

## 2021-03-11 NOTE — Progress Notes (Signed)
ANTICOAGULATION CONSULT NOTE  Pharmacy Consult for heparin Indication: atrial fibrillation  No Known Allergies  Patient Measurements: Height: 5\' 2"  (157.5 cm) Weight: 71.8 kg (158 lb 4.6 oz) IBW/kg (Calculated) : 50.1 Heparin Dosing Weight: 65.4 kg  Vital Signs: Temp: 98.7 F (37.1 C) (12/10 2044) Temp Source: Oral (12/10 1554) BP: 119/52 (12/10 2044) Pulse Rate: 59 (12/10 2044)  Labs: Recent Labs    03/09/21 2223 03/10/21 0401 03/10/21 1941 03/10/21 1941 03/10/21 2222 03/11/21 0044 03/11/21 1000 03/11/21 2205  HGB 10.4*  --   --   --   --  9.5*  --   --   HCT 31.9*  --   --   --   --  29.7*  --   --   PLT 189  --   --   --   --  146*  --   --   APTT  --   --  >200*   < > >200*  --  85* 69*  HEPARINUNFRC  --   --  >1.10*  --  >1.10*  --   --  >1.10*  CREATININE 0.98 0.96  --   --   --  0.74  --   --   TROPONINIHS 8  --   --   --   --   --   --   --    < > = values in this interval not displayed.     Estimated Creatinine Clearance: 50.3 mL/min (by C-G formula based on SCr of 0.74 mg/dL).   Assessment: 80 yo F with pAF (PTA eliquis), CHF, HTN, HLD and GERD. Pt with N/V bc of recurrent gastric volvulus with partial obstruction. GI and surgery following with plans for surgical reduction and repair of hernia. Holding PTA eliquis, last dose 12/8 AM - heparin gtt in the meantime.   Heparin level came back elevated as expected, aPTT is therapeutic at 69, on 700 units/hr. No s/sx of bleeding or infusion issues.   Note the half life of Eliquis ~12 hours and patient's CrCl~50 is stable. Anticipate Eliquis being cleared from system within the next day or two. Will continue to dose off aPTT until correlates with heparin level.   Goal of Therapy:  Heparin level 0.3-0.7 units/ml aPTT 66-102 seconds Monitor platelets by anticoagulation protocol: Yes   Plan:  Continue heparin at 700 units/hr Daily aPTT/heparin level until correlating, then just daily heparin level  F/u  surgery plans to restart Eliquis   14/8, PharmD, BCCCP Clinical Pharmacist  Phone: [phone] 03/11/2021 10:44 PM  Please check AMION for all [facility] Pharm[facility] phone numbers After 10:00 PM, call Main Pharmacy [phone]

## 2021-03-12 LAB — APTT
aPTT: >200 seconds (ref 24–36)
aPTT: 41 seconds — ABNORMAL HIGH (ref 24–36)
aPTT: 79 seconds — ABNORMAL HIGH (ref 24–36)

## 2021-03-12 LAB — CBC
HCT: 30.9 % — ABNORMAL LOW (ref 36.0–46.0)
Hemoglobin: 10.4 g/dL — ABNORMAL LOW (ref 12.0–15.0)
MCH: 31.5 pg (ref 26.0–34.0)
MCHC: 33.7 g/dL (ref 30.0–36.0)
MCV: 93.6 fL (ref 80.0–100.0)
Platelets: 138 10*3/uL — ABNORMAL LOW (ref 150–400)
RBC: 3.3 MIL/uL — ABNORMAL LOW (ref 3.87–5.11)
RDW: 15.2 % (ref 11.5–15.5)
WBC: 5.8 10*3/uL (ref 4.0–10.5)
nRBC: 0 % (ref 0.0–0.2)

## 2021-03-12 LAB — COMPREHENSIVE METABOLIC PANEL
ALT: 41 U/L (ref 0–44)
AST: 38 U/L (ref 15–41)
Albumin: 3.2 g/dL — ABNORMAL LOW (ref 3.5–5.0)
Alkaline Phosphatase: 68 U/L (ref 38–126)
Anion gap: 11 (ref 5–15)
BUN: 7 mg/dL — ABNORMAL LOW (ref 8–23)
CO2: 25 mmol/L (ref 22–32)
Calcium: 8.7 mg/dL — ABNORMAL LOW (ref 8.9–10.3)
Chloride: 101 mmol/L (ref 98–111)
Creatinine, Ser: 0.75 mg/dL (ref 0.44–1.00)
GFR, Estimated: >60 mL/min (ref >60)
Glucose, Bld: 85 mg/dL (ref 70–99)
Potassium: 3.5 mmol/L (ref 3.5–5.1)
Sodium: 137 mmol/L (ref 135–145)
Total Bilirubin: 0.6 mg/dL (ref 0.3–1.2)
Total Protein: 5.5 g/dL — ABNORMAL LOW (ref 6.5–8.1)

## 2021-03-12 LAB — HEPARIN LEVEL (UNFRACTIONATED): Heparin Unfractionated: >1.1 IU/mL — ABNORMAL HIGH (ref 0.30–0.70)

## 2021-03-12 LAB — BRAIN NATRIURETIC PEPTIDE: B Natriuretic Peptide: 383.8 pg/mL — ABNORMAL HIGH (ref 0.0–100.0)

## 2021-03-12 LAB — MAGNESIUM: Magnesium: 1.8 mg/dL (ref 1.7–2.4)

## 2021-03-12 MED ORDER — POTASSIUM CHLORIDE CRYS ER 20 MEQ PO TBCR
40.0000 meq | EXTENDED_RELEASE_TABLET | Freq: Once | ORAL | Status: AC
  Administered 2021-03-12: 40 meq via ORAL
  Filled 2021-03-12: qty 2

## 2021-03-12 MED ORDER — MAGNESIUM SULFATE IN D5W 1-5 GM/100ML-% IV SOLN
1.0000 g | Freq: Once | INTRAVENOUS | Status: AC
  Administered 2021-03-12: 1 g via INTRAVENOUS
  Filled 2021-03-12: qty 100

## 2021-03-12 NOTE — Progress Notes (Signed)
Date and time results received: 03/12/21 0316 (use smartphrase ".now" to insert current time)  Test: PTT Critical Value: >200  Name of Provider Notified: X. Blount   Orders Received? Or Actions Taken?: See new orders

## 2021-03-12 NOTE — Progress Notes (Signed)
ANTICOAGULATION CONSULT NOTE  Pharmacy Consult for heparin Indication: atrial fibrillation  No Known Allergies  Patient Measurements: Height: 5\' 2"  (157.5 cm) Weight: 71.8 kg (158 lb 4.6 oz) IBW/kg (Calculated) : 50.1 Heparin Dosing Weight: 65.4 kg  Vital Signs: Temp: 97.9 F (36.6 C) (12/11 1626) Temp Source: Oral (12/11 1626) BP: 144/67 (12/11 1626) Pulse Rate: 68 (12/11 1626)  Labs: Recent Labs    03/09/21 2223 03/10/21 0401 03/10/21 1941 03/10/21 2222 03/11/21 0044 03/11/21 1000 03/11/21 2205 03/12/21 0053 03/12/21 0345 03/12/21 1445  HGB 10.4*  --   --   --  9.5*  --   --  10.4*  --   --   HCT 31.9*  --   --   --  29.7*  --   --  30.9*  --   --   PLT 189  --   --   --  146*  --   --  138*  --   --   APTT  --   --    < > >200*  --    < > 69* >200* 41* 79*  HEPARINUNFRC  --   --    < > >1.10*  --   --  >1.10* >1.10*  --   --   CREATININE 0.98 0.96  --   --  0.74  --   --  0.75  --   --   TROPONINIHS 8  --   --   --   --   --   --   --   --   --    < > = values in this interval not displayed.     Estimated Creatinine Clearance: 50.3 mL/min (by C-G formula based on SCr of 0.75 mg/dL).   Assessment: 80 yo F with pAF (PTA eliquis), CHF, HTN, HLD and GERD. Pt with N/V bc of recurrent gastric volvulus with partial obstruction. GI and surgery following with plans for surgical reduction and repair of hernia. Holding PTA eliquis, last dose 12/8 AM - heparin gtt in the meantime.   aPTT came back therapeutic at 79, on 850 units/hr. No s/sx of bleeding or infusion issues.   Goal of Therapy:  Heparin level 0.3-0.7 units/ml aPTT 66-102 seconds Monitor platelets by anticoagulation protocol: Yes   Plan:  -Continue heparin at 850 units/hr -Monitor daily HL/aPTT (until correlate), CBC, and for s/sx of bleeding   14/8, PharmD, BCCCP Clinical Pharmacist  Phone: [phone] 03/12/2021 4:33 PM  Please check AMION for all [facility] Pharmacy phone numbers After 10:00 PM,  call Main Pharmacy [phone]

## 2021-03-12 NOTE — Progress Notes (Signed)
PROGRESS NOTE                                                                                                                                                                                                             Patient Demographics:    Danielle Blanchard, is a 80 y.o. female, DOB - 02/27/1941, TY:[id number]  Outpatient Primary MD for the [facility] is [facility] [place] date - 03/09/2021    Chief Complaint  Patient presents with   Chest Pain   Abdominal Pain       Brief Narrative (HPI from H&P)  Danielle Blanchard is a 80 y.o. 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   Subjective:   Patient in bed, appears comfortable, denies any headache, no fever, no chest pain or pressure, no shortness of breath , no abdominal pain. No new focal weakness.   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  She has been treated conservatively with bowel rest and IV fluids, her symptoms are much better and abdominal exam now is benign, she is passing some flatus and known nausea at this time.  Plan is n.p.o. except medications, IV fluids, prepare for surgical repair of her hiatal hernia, timing as per CCS,  Case discussed with general surgery PA Saverio Danker on 03/10/2021.  Have requested the surgery to to  kindly keep family updated on surgical plans.  Of note patient will be a moderate to high risk for adverse cardiopulmonary outcome during the perioperative period, this is due to her advanced age and underlying medical issues, risks and benefits were discussed with patient and patient's son who accepts the risk and want to proceed for surgical intervention.   2.  Paroxysmal atrial fibrillation.  Mali vas 2 score  of greater than 4.  Heparin drip instead of Eliquis, continue Coreg and amiodarone orally with a sip of water, repeat echocardiogram stable.  3.  Hypertension.  Continue Coreg along with as needed IV Lopressor and hydralazine.  4.  History of sick sinus syndrome.  S/p pacemaker placement.  5.  Chronic diastolic CHF EF [id number] last echocardiogram at [facility].  Repeat echo remains stable.    6.  Severe hypokalemia.  Replaced IV will monitor.         Condition - Guarded  Family Communication  :  son [name] [phone] - on 03/10/21, 03/11/21  Code Status :  Full  Consults  :  CCS  PUD Prophylaxis : PPI   Procedures  :     TTE - 1. Left ventricular ejection fraction, by estimation, is 60 to 65%. The left ventricle has normal function. The left ventricle has no regional wall motion abnormalities. Left ventricular diastolic parameters are consistent with Grade II diastolic dysfunction (pseudonormalization).  2. Right ventricular systolic function is normal. The right ventricular size is normal.  3. Left atrial size was moderate to severe.  4. Right atrial size was moderately dilated.  5. The mitral valve is grossly normal. Mild mitral valve regurgitation.  6. Focal calcification. P1/2 T 501 ms. The aortic valve is calcified. Aortic valve regurgitation is mild to moderate.  7. The inferior vena cava is normal in size with greater than 50% respiratory variability, suggesting right atrial pressure of 3 mmHg. Comparison(s): No prior Echocardiogram  CT - 1. Large hiatal hernia with the gastric  body and antrum intrathoracic in location and air-fluid levels in the stomach, unchanged from the previous exam. Findings are concerning for gastric volvulus or early obstruction. Surgical consultation is recommended. 2. Coronary artery calcifications. 3. Stable compression deformity in the inferior endplate of 624THL.       Disposition Plan  :    Status is: Inpatient  Remains inpatient appropriate because: GI Volvulus  DVT Prophylaxis  :    SCDs Start: 03/10/21 0229    Lab Results  Component Value Date   PLT 138 (L) 03/12/2021    Diet :  Diet Order             Diet NPO time specified Except for: Sips with Meds  Diet effective now                    Inpatient Medications  Scheduled Meds:  amiodarone  200 mg Oral Daily   carvedilol  3.125 mg Oral BID WC   levothyroxine  75 mcg Oral Q0600   pantoprazole (PROTONIX) IV  40 mg Intravenous Q24H   rosuvastatin  20 mg Oral Daily   Continuous Infusions:  dextrose 5 % and 0.45 % NaCl with KCl 10 mEq/L 60 mL/hr at 03/12/21 0755   heparin 850 Units/hr (03/12/21 0626)   promethazine (PHENERGAN) injection (IM or IVPB)     PRN Meds:.hydrALAZINE, metoprolol tartrate, morphine injection, promethazine (PHENERGAN) injection (IM or IVPB)  Antibiotics  :    Anti-infectives (From admission, onward)    None        Time Spent in minutes  30   Lala Lund M.D on 03/12/2021 at 11:06 AM  To page go to [email or web address]   [facility] -  Office  [phone]  See all Orders from today for further details    Objective:   Vitals:   03/11/21 1554 03/11/21 2044 03/12/21 0327 03/12/21 0804  BP: 126/60 (!) 119/52 130/60 [name]  Pulse: 60 (!) 59 60 63  Resp: 18 17 16 16   Temp: 98.4 F (36.9 C) 98.7 F (37.1 C) 98.7 F (37.1 C) 97.7 F (36.5 C)  TempSrc: Oral  Oral Axillary  SpO2: 95% 94% 96% 94%  Weight:      Height:        Wt Readings from Last 3 Encounters:  03/09/21 71.8 kg  02/13/21 71.8 kg  07/22/19  65.8 kg     Intake/Output Summary (Last 24 hours) at 03/12/2021 1106 Last data filed at 03/12/2021 0107 Gross per 24 hour  Intake 0 ml  Output 550 ml  Net -550 ml     Physical Exam  Awake Alert, No new F.N deficits, Normal affect [name],[name] Supple Neck, No JVD,   Symmetrical Chest wall movement, Good air movement bilaterally, CTAB RRR,No Gallops, Rubs or new Murmurs,  +ve B.Sounds, Abd Soft, No tenderness,   No Cyanosis, Clubbing or edema       Data Review:    CBC Recent Labs  Lab 03/09/21 2223 03/11/21 0044 03/12/21 0053  WBC 5.9 4.5 5.8  HGB 10.4* 9.5* 10.4*  HCT 31.9* 29.7* 30.9*  PLT 189 146* 138*  MCV 96.1 96.1 93.6  MCH 31.3 30.7 31.5  MCHC 32.6 32.0 33.7  RDW 15.3 15.6* 15.2  LYMPHSABS 1.2  --   --   MONOABS 0.6  --   --   EOSABS 0.2  --   --   BASOSABS 0.1  --   --     Electrolytes Recent Labs  Lab 03/09/21 2223 03/10/21 0401 03/11/21 0044 03/12/21 0053  NA 135 137 139 137  K 3.1* 2.8* 4.2 3.5  CL 97* 95* 103 101  CO2 30 31 29 25   GLUCOSE 137* 124* 78 85  BUN 19 19 8  7*  CREATININE 0.98 0.96 0.74 0.75  CALCIUM 9.1 8.9 8.7* 8.7*  AST 50*  --  35 38  ALT 52*  --  38 41  ALKPHOS 72  --  63 68  BILITOT 0.8  --  0.5 0.6  ALBUMIN 3.5  --  2.8* 3.2*  MG  --  2.1 1.9 1.8  BNP  --   --  326.4* 383.8*    ------------------------------------------------------------------------------------------------------------------ No results for input(s): CHOL, HDL, LDLCALC, TRIG, CHOLHDL, LDLDIRECT in the last 72 hours.  No results found for: HGBA1C  No results for input(s): TSH, T4TOTAL, T3FREE, THYROIDAB in the last 72 hours.  Invalid input(s): FREET3 ------------------------------------------------------------------------------------------------------------------ ID Labs Recent Labs  Lab 03/09/21 2223 03/10/21 0401 03/11/21 0044 03/12/21 0053  WBC 5.9  --  4.5 5.8  PLT 189  --  146* 138*  CREATININE 0.98 0.96 0.74 0.75   Cardiac  Enzymes No results for input(s): CKMB, TROPONINI, MYOGLOBIN in the last 168 hours.  Invalid input(s): CK   Radiology Reports CT CHEST ABDOMEN PELVIS W CONTRAST  Result Date: 03/10/2021 CLINICAL DATA:  Chest pain.  History of hernia. EXAM: CT CHEST, ABDOMEN, AND PELVIS WITH CONTRAST TECHNIQUE: Multidetector CT imaging of the chest, abdomen and pelvis was performed following the standard protocol during bolus administration of intravenous contrast. CONTRAST:  121mL OMNIPAQUE IOHEXOL 300 MG/ML  SOLN COMPARISON:  02/11/2021. FINDINGS: CT CHEST FINDINGS Cardiovascular: The heart is normal in size and there is no pericardial effusion. Scattered coronary artery calcifications are noted. There is a left-sided pacemaker with leads terminating in the heart. There is mild atherosclerotic calcification of the aorta without evidence of aneurysm. Pulmonary trunk is normal in  caliber. Mediastinum/Nodes: No enlarged mediastinal, hilar, or axillary lymph nodes. Thyroid gland, trachea, and esophagus demonstrate no significant findings. There is a large hiatal hernia, unchanged from the prior exam. Lungs/Pleura: Apical pleural scarring and mild subpleural fibrosis is noted bilaterally. Mild atelectasis is present in the lower lobes bilaterally adjacent to the hernia pouch. No effusion or pneumothorax. Musculoskeletal: An old rib fracture is noted at T10 on the right. Degenerative changes are present in the thoracic spine. A compression deformity is present in the inferior endplate of 624THL which is unchanged from the prior exam. CT ABDOMEN PELVIS FINDINGS Hepatobiliary: No focal liver abnormality is seen. There is mild biliary ductal dilatation which is likely related to patient's post cholecystectomy status. Pancreas: Unremarkable. No pancreatic ductal dilatation or surrounding inflammatory changes. Spleen: Normal in size without focal abnormality. Adrenals/Urinary Tract: Adrenal glands are unremarkable. Kidneys are normal,  without renal calculi, focal lesion, or hydronephrosis. Bladder is unremarkable. Stomach/Bowel: There is a large hiatal hernia with the gastric body and antrum intrathoracic in location. Air-fluid levels are noted in the stomach. The gastric fundus and proximal body are intra-abdominal in location. Scattered diverticula are present along the colon without evidence of diverticulitis. The appendix is not visualized on exam. No free air or pneumatosis is seen. Vascular/Lymphatic: Aortic atherosclerosis. No enlarged abdominal or pelvic lymph nodes. Reproductive: Uterus and bilateral adnexa are unremarkable. Other: No free fluid. A small fat containing inguinal hernia is noted on the left. Musculoskeletal: Degenerative changes in the lumbar spine. No acute osseous abnormality is seen. IMPRESSION: 1. Large hiatal hernia with the gastric body and antrum intrathoracic in location and air-fluid levels in the stomach, unchanged from the previous exam. Findings are concerning for gastric volvulus or early obstruction. Surgical consultation is recommended. 2. Coronary artery calcifications. 3. Stable compression deformity in the inferior endplate of 624THL. Electronically Signed   By: Brett Fairy M.D.   On: 03/10/2021 01:02   DG Chest Port 1 View  Result Date: 03/09/2021 CLINICAL DATA:  Chest pain EXAM: PORTABLE CHEST 1 VIEW COMPARISON:  02/16/2021 FINDINGS: Cardiac shadow is stable. Pacing device is again noted. Large hiatal hernia is again seen. Lungs are clear bilaterally. No bony abnormality is noted. IMPRESSION: Stable large hiatal hernia.  No new focal abnormality is noted. Electronically Signed   By: Inez Catalina M.D.   On: 03/09/2021 23:17   ECHOCARDIOGRAM COMPLETE  Result Date: 03/10/2021    ECHOCARDIOGRAM REPORT   Patient Name:   Danielle Blanchard Date of Exam: 03/10/2021 Medical Rec #:  [id number]   Height:       62.0 in Accession #:    [id number]  Weight:       158.3 lb Date of Birth:  Feb 20, 1941   BSA:          1.731  m Patient Age:    25 years    BP:           118/58 mmHg Patient Gender: F           HR:           60 bpm. Exam Location:  Inpatient Procedure: Cardiac Doppler, Color Doppler, 3D Echo and 2D Echo STAT ECHO Indications:    CHF  History:        Patient has no prior history of Echocardiogram examinations.                 CHF; Arrythmias:Atrial Fibrillation.  Sonographer:    Glo Herring Referring Phys: [id number] Margaree Mackintosh [facility]  IMPRESSIONS  1. Left ventricular ejection fraction, by estimation, is 60 to 65%. The left ventricle has normal function. The left ventricle has no regional wall motion abnormalities. Left ventricular diastolic parameters are consistent with Grade II diastolic dysfunction (pseudonormalization).  2. Right ventricular systolic function is normal. The right ventricular size is normal.  3. Left atrial size was moderate to severe.  4. Right atrial size was moderately dilated.  5. The mitral valve is grossly normal. Mild mitral valve regurgitation.  6. Focal calcification. P1/2 T 501 ms. The aortic valve is calcified. Aortic valve regurgitation is mild to moderate.  7. The inferior vena cava is normal in size with greater than 50% respiratory variability, suggesting right atrial pressure of 3 mmHg. Comparison(s): No prior Echocardiogram. FINDINGS  Left Ventricle: Left ventricular ejection fraction, by estimation, is 60 to 65%. The left ventricle has normal function. The left ventricle has no regional wall motion abnormalities. The left ventricular internal cavity size was normal in size. There is  no left ventricular hypertrophy. Left ventricular diastolic parameters are consistent with Grade II diastolic dysfunction (pseudonormalization). Right Ventricle: The right ventricular size is normal. No increase in right ventricular wall thickness. Right ventricular systolic function is normal. Left Atrium: Left atrial size was moderate to severe. Right Atrium: Right atrial size was moderately dilated.  Pericardium: There is no evidence of pericardial effusion. Mitral Valve: The mitral valve is grossly normal. Mild mitral annular calcification. Mild mitral valve regurgitation. MV peak gradient, 7.2 mmHg. The mean mitral valve gradient is 2.5 mmHg. Tricuspid Valve: The tricuspid valve is grossly normal. Tricuspid valve regurgitation is mild. Aortic Valve: Focal calcification. P1/2 T 501 ms. The aortic valve is calcified. Aortic valve regurgitation is mild to moderate. Aortic regurgitation PHT measures 501 msec. Aortic valve mean gradient measures 4.0 mmHg. Aortic valve peak gradient measures  6.7 mmHg. Aortic valve area, by VTI measures 1.89 cm. Pulmonic Valve: The pulmonic valve was grossly normal. Pulmonic valve regurgitation is mild. Aorta: The aortic root and ascending aorta are structurally normal, with no evidence of dilitation. Venous: The inferior vena cava is normal in size with greater than 50% respiratory variability, suggesting right atrial pressure of 3 mmHg. IAS/Shunts: No atrial level shunt detected by color flow Doppler. Additional Comments: A device lead is visualized.  LEFT VENTRICLE PLAX 2D LVIDd:         5.00 cm     Diastology LVIDs:         3.20 cm     LV e' medial:    6.62 cm/s LV PW:         0.80 cm     LV E/e' medial:  16.5 LV IVS:        0.90 cm     LV e' lateral:   7.37 cm/s LVOT diam:     2.00 cm     LV E/e' lateral: 14.8 LV SV:         63 LV SV Index:   36 LVOT Area:     3.14 cm  LV Volumes (MOD) LV vol d, MOD A2C: 77.8 ml LV vol d, MOD A4C: 95.6 ml LV vol s, MOD A2C: 32.5 ml LV vol s, MOD A4C: 35.4 ml LV SV MOD A2C:     45.3 ml LV SV MOD A4C:     95.6 ml LV SV MOD BP:      52.9 ml RIGHT VENTRICLE             IVC RV  Basal diam:  3.70 cm     IVC diam: 1.70 cm RV S prime:     12.80 cm/s LEFT ATRIUM             Index        RIGHT ATRIUM           Index LA diam:        3.90 cm 2.25 cm/m   RA Area:     18.40 cm LA Vol (A2C):   89.2 ml 51.54 ml/m  RA Volume:   45.40 ml  26.23 ml/m LA Vol  (A4C):   67.8 ml 39.17 ml/m LA Biplane Vol: 80.4 ml 46.45 ml/m  AORTIC VALVE                    PULMONIC VALVE AV Area (Vmax):    1.88 cm     PV Vmax:       0.74 m/s AV Area (Vmean):   1.74 cm     PV Peak grad:  2.2 mmHg AV Area (VTI):     1.89 cm AV Vmax:           129.00 cm/s AV Vmean:          90.600 cm/s AV VTI:            0.331 m AV Peak Grad:      6.7 mmHg AV Mean Grad:      4.0 mmHg LVOT Vmax:         77.00 cm/s LVOT Vmean:        50.200 cm/s LVOT VTI:          0.199 m LVOT/AV VTI ratio: 0.60 AI PHT:            501 msec  AORTA Ao Root diam: 3.20 cm Ao Asc diam:  3.60 cm MITRAL VALVE                TRICUSPID VALVE MV Area (PHT): 3.36 cm     TR Peak grad:   26.2 mmHg MV Area VTI:   1.52 cm     TR Vmax:        256.00 cm/s MV Peak grad:  7.2 mmHg MV Mean grad:  2.5 mmHg     SHUNTS MV Vmax:       1.34 m/s     Systemic VTI:  0.20 m MV Vmean:      73.4 cm/s    Systemic Diam: 2.00 cm MV Decel Time: 226 msec MR PISA:        2.26 cm MR PISA Radius: 0.60 cm MV E velocity: 109.00 cm/s MV A velocity: 60.40 cm/s MV E/A ratio:  1.80 Landscape architect signed by Phineas Inches Signature Date/Time: 03/10/2021/12:53:06 PM    Final

## 2021-03-12 NOTE — Progress Notes (Signed)
Mobility Specialist Progress Note:   03/12/21 1300  Mobility  Activity Ambulated in hall  Level of Assistance Standby assist, set-up cues, supervision of patient - no hands on  Assistive Device Front wheel walker  Distance Ambulated (ft) 300 ft  Mobility Ambulated independently in hallway;Out of bed for toileting  Mobility Response Tolerated well  Mobility performed by Mobility specialist  $Mobility charge 1 Mobility   Pt asx during ambulation. Ambulated to BR before mobility to void. Pt back in bed with all needs met.     Mobility Specialist  Phone [phone]  

## 2021-03-12 NOTE — Progress Notes (Signed)
Attempted to contact son Lenell Antu in regard to his mother's care plan.  Son did not answer and left voicemail stating we will attempt to contact again tomorrow. At this time do not have a definitive date for surgery but planning to do this during admission

## 2021-03-12 NOTE — Progress Notes (Signed)
ANTICOAGULATION CONSULT NOTE  Pharmacy Consult for heparin Indication: atrial fibrillation  No Known Allergies  Patient Measurements: Height: 5\' 2"  (157.5 cm) Weight: 71.8 kg (158 lb 4.6 oz) IBW/kg (Calculated) : 50.1 Heparin Dosing Weight: 65.4 kg  Vital Signs: Temp: 98.7 F (37.1 C) (12/11 0327) Temp Source: Oral (12/11 0327) BP: 130/60 (12/11 0327) Pulse Rate: 60 (12/11 0327)  Labs: Recent Labs    03/09/21 2223 03/10/21 0401 03/10/21 1941 03/10/21 2222 03/11/21 0044 03/11/21 1000 03/11/21 2205 03/12/21 0053 03/12/21 0345  HGB 10.4*  --   --   --  9.5*  --   --  10.4*  --   HCT 31.9*  --   --   --  29.7*  --   --  30.9*  --   PLT 189  --   --   --  146*  --   --  138*  --   APTT  --   --    < > >200*  --    < > 69* >200* 41*  HEPARINUNFRC  --   --    < > >1.10*  --   --  >1.10* >1.10*  --   CREATININE 0.98 0.96  --   --  0.74  --   --  0.75  --   TROPONINIHS 8  --   --   --   --   --   --   --   --    < > = values in this interval not displayed.     Estimated Creatinine Clearance: 50.3 mL/min (by C-G formula based on SCr of 0.75 mg/dL).   Assessment: 80 yo F with pAF (PTA eliquis), CHF, HTN, HLD and GERD. Pt with N/V bc of recurrent gastric volvulus with partial obstruction. GI and surgery following with plans for surgical reduction and repair of hernia. Holding PTA eliquis, last dose 12/8 AM - heparin gtt in the meantime.   -aPTT= 41 -IV line was lost and heparin infusion was off for a very brief time per RN   Goal of Therapy:  Heparin level 0.3-0.7 units/ml aPTT 66-102 seconds Monitor platelets by anticoagulation protocol: Yes   Plan:  -Increase heparin to 850 units/hr -aPTT in 8 hrs  14/8, PharmD Clinical Pharmacist **Pharmacist phone directory can now be found on [email or web address] ([email or web address]).  Listed under [facility].

## 2021-03-13 ENCOUNTER — Inpatient Hospital Stay: Payer: Self-pay

## 2021-03-13 DIAGNOSIS — Z01810 Encounter for preprocedural cardiovascular examination: Secondary | ICD-10-CM

## 2021-03-13 DIAGNOSIS — I480 Paroxysmal atrial fibrillation: Secondary | ICD-10-CM

## 2021-03-13 DIAGNOSIS — Z950 Presence of cardiac pacemaker: Secondary | ICD-10-CM

## 2021-03-13 DIAGNOSIS — I495 Sick sinus syndrome: Secondary | ICD-10-CM

## 2021-03-13 LAB — COMPREHENSIVE METABOLIC PANEL
ALT: 36 U/L (ref 0–44)
AST: 35 U/L (ref 15–41)
Albumin: 3 g/dL — ABNORMAL LOW (ref 3.5–5.0)
Alkaline Phosphatase: 65 U/L (ref 38–126)
Anion gap: 8 (ref 5–15)
BUN: 5 mg/dL — ABNORMAL LOW (ref 8–23)
CO2: 25 mmol/L (ref 22–32)
Calcium: 8.8 mg/dL — ABNORMAL LOW (ref 8.9–10.3)
Chloride: 107 mmol/L (ref 98–111)
Creatinine, Ser: 0.68 mg/dL (ref 0.44–1.00)
GFR, Estimated: >60 mL/min (ref >60)
Glucose, Bld: 92 mg/dL (ref 70–99)
Potassium: 4.1 mmol/L (ref 3.5–5.1)
Sodium: 140 mmol/L (ref 135–145)
Total Bilirubin: 0.7 mg/dL (ref 0.3–1.2)
Total Protein: 5.6 g/dL — ABNORMAL LOW (ref 6.5–8.1)

## 2021-03-13 LAB — CBC
HCT: 28.7 % — ABNORMAL LOW (ref 36.0–46.0)
Hemoglobin: 9.2 g/dL — ABNORMAL LOW (ref 12.0–15.0)
MCH: 30.8 pg (ref 26.0–34.0)
MCHC: 32.1 g/dL (ref 30.0–36.0)
MCV: 96 fL (ref 80.0–100.0)
Platelets: 142 10*3/uL — ABNORMAL LOW (ref 150–400)
RBC: 2.99 MIL/uL — ABNORMAL LOW (ref 3.87–5.11)
RDW: 15.4 % (ref 11.5–15.5)
WBC: 5.2 10*3/uL (ref 4.0–10.5)
nRBC: 0.4 % — ABNORMAL HIGH (ref 0.0–0.2)

## 2021-03-13 LAB — HEPARIN LEVEL (UNFRACTIONATED): Heparin Unfractionated: 0.76 IU/mL — ABNORMAL HIGH (ref 0.30–0.70)

## 2021-03-13 LAB — APTT: aPTT: 89 seconds — ABNORMAL HIGH (ref 24–36)

## 2021-03-13 LAB — PHOSPHORUS: Phosphorus: 3.2 mg/dL (ref 2.5–4.6)

## 2021-03-13 LAB — BRAIN NATRIURETIC PEPTIDE: B Natriuretic Peptide: 503.4 pg/mL — ABNORMAL HIGH (ref 0.0–100.0)

## 2021-03-13 LAB — MAGNESIUM: Magnesium: 2 mg/dL (ref 1.7–2.4)

## 2021-03-13 MED ORDER — FAT EMUL FISH OIL/PLANT BASED 20% (SMOFLIPID)IV EMUL
INTRAVENOUS | Status: DC
Start: 2021-03-13 — End: 2021-03-13

## 2021-03-13 MED ORDER — TRAVASOL 10 % IV SOLN
INTRAVENOUS | Status: DC
  Filled 2021-03-13: qty 594

## 2021-03-13 MED ORDER — INSULIN ASPART 100 UNIT/ML IJ SOLN
0.0000 [IU] | Freq: Four times a day (QID) | INTRAMUSCULAR | Status: DC
Start: 2021-03-14 — End: 2021-03-13

## 2021-03-13 MED ORDER — WHITE PETROLATUM EX OINT
TOPICAL_OINTMENT | CUTANEOUS | Status: AC
  Filled 2021-03-13: qty 28.35

## 2021-03-13 MED ORDER — MUPIROCIN 2 % EX OINT
1.0000 "application " | TOPICAL_OINTMENT | Freq: Two times a day (BID) | CUTANEOUS | Status: AC
  Administered 2021-03-13 – 2021-03-18 (×9): 1 via NASAL
  Filled 2021-03-13 (×5): qty 22

## 2021-03-13 MED ORDER — CHLORHEXIDINE GLUCONATE CLOTH 2 % EX PADS
6.0000 | MEDICATED_PAD | Freq: Every day | CUTANEOUS | Status: AC
  Administered 2021-03-13 – 2021-03-17 (×4): 6 via TOPICAL

## 2021-03-13 MED ORDER — MELATONIN 3 MG PO TABS
3.0000 mg | ORAL_TABLET | Freq: Every day | ORAL | Status: DC
  Administered 2021-03-13 – 2021-03-20 (×8): 3 mg via ORAL
  Filled 2021-03-13 (×8): qty 1

## 2021-03-13 MED ORDER — DEXTROSE-NACL 5-0.45 % IV SOLN: INTRAVENOUS | Status: DC

## 2021-03-13 MED ORDER — KCL IN DEXTROSE-NACL 10-5-0.45 MEQ/L-%-% IV SOLN
INTRAVENOUS | Status: DC
  Filled 2021-03-13: qty 1000

## 2021-03-13 MED ORDER — SODIUM CHLORIDE 0.45 % IV SOLN: INTRAVENOUS | Status: DC

## 2021-03-13 MED ORDER — DOCUSATE SODIUM 100 MG PO CAPS
100.0000 mg | ORAL_CAPSULE | Freq: Two times a day (BID) | ORAL | Status: DC
  Administered 2021-03-13 – 2021-03-21 (×15): 100 mg via ORAL
  Filled 2021-03-13 (×15): qty 1

## 2021-03-13 MED ORDER — CEFAZOLIN SODIUM-DEXTROSE 2-4 GM/100ML-% IV SOLN
2.0000 g | INTRAVENOUS | Status: AC
  Administered 2021-03-14: 2 g via INTRAVENOUS
  Filled 2021-03-13 (×2): qty 100

## 2021-03-13 NOTE — Care Management Important Message (Signed)
Important Message  Patient Details  Name: Danielle Blanchard MRN: [id number] Date of Birth: Aug 03, 1940   Medicare Important Message Given:  Yes     Sherilyn Banker 03/13/2021, 11:49 AM

## 2021-03-13 NOTE — Progress Notes (Signed)
ANTICOAGULATION CONSULT NOTE  Pharmacy Consult for heparin Indication: atrial fibrillation  No Known Allergies  Patient Measurements: Height: 5\' 2"  (157.5 cm) Weight: 71.8 kg (158 lb 4.6 oz) IBW/kg (Calculated) : 50.1 Heparin Dosing Weight: 65.4 kg  Vital Signs: Temp: 98.5 F (36.9 C) (12/12 0735) Temp Source: Oral (12/12 0735) BP: 114/81 (12/12 0735) Pulse Rate: 74 (12/12 0735)  Labs: Recent Labs    03/11/21 0044 03/11/21 1000 03/11/21 2205 03/12/21 0053 03/12/21 0345 03/12/21 1445 03/13/21 0053  HGB 9.5*  --   --  10.4*  --   --  9.2*  HCT 29.7*  --   --  30.9*  --   --  28.7*  PLT 146*  --   --  138*  --   --  142*  APTT  --    < > 69* >200* 41* 79* 89*  HEPARINUNFRC  --   --  >1.10* >1.10*  --   --  0.76*  CREATININE 0.74  --   --  0.75  --   --  0.68   < > = values in this interval not displayed.    Estimated Creatinine Clearance: 50.3 mL/min (by C-G formula based on SCr of 0.68 mg/dL).   Assessment:  80 yo F with pAF (PTA eliquis), CHF, HTN, HLD and GERD. Pt with N/V bc of recurrent gastric volvulus with partial obstruction. GI and surgery following with plans for surgical reduction and repair of hernia. Holding PTA eliquis, last dose 12/8 AM - heparin gtt in the meantime.   aPTT therapeutic at 89, HL at 0.76 not correlating. On 850 units/hr. H/H, plt stable. No s/sx of bleeding or infusion issues. Pending definitive surgical plans.   Goal of Therapy:  Heparin level 0.3-0.7 units/ml aPTT 66-102 seconds Monitor platelets by anticoagulation protocol: Yes   Plan:  Continue heparin at 850 units/hr F/u aPTT until correlates with heparin level  Monitor daily aPTT HL, CBC/plt Monitor for signs/symptoms of bleeding  F/u surgical plans and apixaban restart   14/8, PharmD, BCPS, [facility] Clinical Pharmacist  Please check AMION for all [facility] Pharmacy phone numbers After 10:00 PM, call Main Pharmacy [phone]

## 2021-03-13 NOTE — Progress Notes (Signed)
PROGRESS NOTE                                                                                                                                                                                                             Patient Demographics:    Danielle Blanchard, is a 80 y.o. female, DOB - Feb 23, 1941, [id number]  Outpatient Primary [facility] for the [facility] is [facility]    LOS - 3  Admit date - 03/09/2021    Chief Complaint  Patient presents with   Chest Pain   Abdominal Pain       Brief Narrative (HPI from H&P)  Danielle Blanchard is a 80 y.o. 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   Subjective:   Patient in bed, appears comfortable, denies any headache, no fever, no chest pain or pressure, no shortness of breath , no abdominal pain. No new focal weakness.    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  She has been treated conservatively with bowel rest and IV fluids, her symptoms are much better and abdominal exam now is benign, she is passing some flatus and known nausea at this time.  Plan is n.p.o. except medications, IV fluids, prepare for surgical repair of her hiatal hernia, timing as per CCS,  Case discussed with general surgery PA Saverio Danker on 03/13/2021.  Have requested the surgery to to  kindly keep family updated on surgical plans, so far tentative plan for surgery is 03/14/2021.  She is now 3 days n.p.o. we will proceed with PICC line and TNA.  Of note patient will be a moderate to high risk for adverse cardiopulmonary outcome during the perioperative period, this is due to her advanced age and underlying medical issues, risks and benefits were discussed with patient and patient's  son who accepts the risk and want to proceed for surgical intervention.   2.  Paroxysmal atrial fibrillation.  Mali vas 2 score of greater than 4.  Heparin drip instead of Eliquis, continue Coreg and amiodarone orally with a sip of water, repeat echocardiogram stable.  3.  Hypertension.  Continue Coreg along with as needed IV Lopressor and hydralazine.  4.  History of sick sinus syndrome.  S/p pacemaker placement.  5.  Chronic diastolic CHF EF [id number] last echocardiogram at [facility].  Repeat echo remains stable.    6. Severe hypokalemia.  Replaced IV will monitor.         Condition - Guarded  Family Communication  :  son [name] [phone] - on 03/10/21, 03/11/21  Code Status :  Full  Consults  :  CCS, Cards  PUD Prophylaxis : PPI   Procedures  :     TTE - 1. Left ventricular ejection fraction, by estimation, is 60 to 65%. The left ventricle has normal function. The left ventricle has no regional wall motion abnormalities. Left ventricular diastolic parameters are consistent with Grade II diastolic dysfunction (pseudonormalization).  2. Right ventricular systolic function is normal. The right ventricular size is normal.  3. Left atrial size was moderate to severe.  4. Right atrial size was moderately dilated.  5. The mitral valve is grossly normal. Mild mitral valve regurgitation.  6. Focal calcification. P1/2 T 501 ms. The aortic valve is calcified. Aortic valve regurgitation is mild to moderate.  7. The inferior vena cava is normal in size with greater than 50% respiratory variability,  suggesting right atrial pressure of 3 mmHg. Comparison(s): No prior Echocardiogram  CT - 1. Large hiatal hernia with the gastric body and antrum intrathoracic in location and air-fluid levels in the stomach, unchanged from the previous exam. Findings are concerning for gastric volvulus or early obstruction. Surgical consultation is recommended. 2. Coronary artery calcifications. 3. Stable compression deformity in the inferior endplate of 624THL.       Disposition Plan  :    Status is: Inpatient  Remains inpatient appropriate because: GI Volvulus  DVT Prophylaxis  :    SCDs Start: 03/10/21 0229    Lab Results  Component Value Date   PLT 142 (L) 03/13/2021    Diet :  Diet Order             Diet NPO time specified Except for: Sips with Meds  Diet effective now                    Inpatient Medications  Scheduled Meds:  amiodarone  200 mg Oral Daily   carvedilol  3.125 mg Oral BID WC   docusate sodium  100 mg Oral BID   levothyroxine  75 mcg Oral Q0600   pantoprazole (PROTONIX) IV  40 mg Intravenous Q24H   rosuvastatin  20 mg Oral Daily   Continuous Infusions:  dextrose 5 % and 0.45 % NaCl with KCl 10 mEq/L 60 mL/hr at 03/13/21 0433   heparin 850 Units/hr (03/13/21 0433)   promethazine (PHENERGAN) injection (IM or IVPB)     PRN Meds:.hydrALAZINE, metoprolol tartrate, morphine injection, promethazine (PHENERGAN) injection (IM or IVPB)  Antibiotics  :    Anti-infectives (From admission, onward)    None        Time Spent in minutes  30   Lala Lund M.D on 03/13/2021 at 10:08 AM  To page go to [email or web address]   [facility] -  Office  [phone]  See all Orders from today for further details    Objective:   Vitals:   03/12/21 1626 03/12/21 1923 03/13/21 0402 03/13/21 0735  BP: (!) 144/67 136/87 (!) 125/58 114/81  Pulse: 68 62 60 74  Resp: 16 17 16 16   Temp: 97.9 F (36.6 C) 98.2 F (36.8 C) 98.5 F (36.9 C) 98.5 F (36.9 C)  TempSrc:  Oral   Oral  SpO2: 95% 93% 94% 97%  Weight:      Height:        Wt Readings from Last 3 Encounters:  03/09/21 71.8 kg  02/13/21 71.8 kg  07/22/19 65.8 kg     Intake/Output Summary (Last 24 hours) at 03/13/2021 1008 Last data filed at 03/13/2021 0433 Gross per 24 hour  Intake 655.77 ml  Output 200 ml  Net 455.77 ml     Physical Exam  Awake Alert, No new F.N deficits, Normal affect Pump Back.AT,[name] Supple Neck, No JVD,   Symmetrical Chest wall movement, Good air movement bilaterally, CTAB RRR,No Gallops, Rubs or new Murmurs,  +ve B.Sounds, Abd Soft, No tenderness,   No Cyanosis, Clubbing or edema       Data Review:    CBC Recent Labs  Lab 03/09/21 2223 03/11/21 0044 03/12/21 0053 03/13/21 0053  WBC 5.9 4.5 5.8 5.2  HGB 10.4* 9.5* 10.4* 9.2*  HCT 31.9* 29.7* 30.9* 28.7*  PLT 189 146* 138* 142*  MCV 96.1 96.1 93.6 96.0  MCH 31.3 30.7 31.5 30.8  MCHC 32.6 32.0 33.7 32.1  RDW 15.3 15.6* 15.2 15.4  LYMPHSABS 1.2  --   --   --   MONOABS 0.6  --   --   --   EOSABS 0.2  --   --   --   BASOSABS 0.1  --   --   --     Electrolytes Recent Labs  Lab 03/09/21 2223 03/10/21 0401 03/11/21 0044 03/12/21 0053 03/13/21 0053  NA 135 137 139 137 140  K 3.1* 2.8* 4.2 3.5 4.1  CL 97* 95* 103 101 107  CO2 30 31 29 25 25   GLUCOSE 137* 124* 78 85 92  BUN 19 19 8  7* 5*  CREATININE 0.98 0.96 0.74 0.75 0.68  CALCIUM 9.1 8.9 8.7* 8.7* 8.8*  AST 50*  --  35 38 35  ALT 52*  --  38 41 36  ALKPHOS 72  --  63 68 65  BILITOT 0.8  --  0.5 0.6 0.7  ALBUMIN 3.5  --  2.8* 3.2* 3.0*  MG  --  2.1 1.9 1.8 2.0  BNP  --   --  326.4* 383.8* 503.4*    ------------------------------------------------------------------------------------------------------------------ No results for input(s): CHOL, HDL, LDLCALC, TRIG, CHOLHDL, LDLDIRECT in the last 72 hours.  No results found for: HGBA1C  No results for input(s): TSH, T4TOTAL, T3FREE, THYROIDAB in the last 72 hours.  Invalid  input(s): FREET3 ------------------------------------------------------------------------------------------------------------------ ID Labs Recent Labs  Lab 03/09/21 2223 03/10/21 0401 03/11/21 0044 03/12/21 0053 03/13/21 0053  WBC 5.9  --  4.5 5.8 5.2  PLT 189  --  146* 138* 142*  CREATININE 0.98 0.96 0.74 0.75 0.68   Cardiac Enzymes No results for input(s): CKMB, TROPONINI, MYOGLOBIN in the last 168 hours.  Invalid input(s): CK   Radiology Reports CT CHEST ABDOMEN PELVIS W CONTRAST  Result Date: 03/10/2021 CLINICAL DATA:  Chest pain.  History of hernia. EXAM: CT CHEST, ABDOMEN, AND PELVIS WITH CONTRAST TECHNIQUE: Multidetector CT imaging of the chest, abdomen  and pelvis was performed following the standard protocol during bolus administration of intravenous contrast. CONTRAST:  OMNIPAQUE IOHEXOL 300 MG/ML  SOLN COMPARISON:  02/11/2021. FINDINGS: CT CHEST FINDINGS Cardiovascular: The heart is normal in size and there is no pericardial effusion. Scattered coronary artery calcifications are noted. There is a left-sided pacemaker with leads terminating in the heart. There is mild atherosclerotic calcification of the aorta without evidence of aneurysm. Pulmonary trunk is normal in caliber. Mediastinum/Nodes: No enlarged mediastinal, hilar, or axillary lymph nodes. Thyroid gland, trachea, and esophagus demonstrate no significant findings. There is a large hiatal hernia, unchanged from the prior exam. Lungs/Pleura: Apical pleural scarring and mild subpleural fibrosis is noted bilaterally. Mild atelectasis is present in the lower lobes bilaterally adjacent to the hernia pouch. No effusion or pneumothorax. Musculoskeletal: An old rib fracture is noted at T10 on the right. Degenerative changes are present in the thoracic spine. A compression deformity is present in the inferior endplate of T12 which is unchanged from the prior exam. CT ABDOMEN PELVIS FINDINGS Hepatobiliary: No focal liver  abnormality is seen. There is mild biliary ductal dilatation which is likely related to patient's post cholecystectomy status. Pancreas: Unremarkable. No pancreatic ductal dilatation or surrounding inflammatory changes. Spleen: Normal in size without focal abnormality. Adrenals/Urinary Tract: Adrenal glands are unremarkable. Kidneys are normal, without renal calculi, focal lesion, or hydronephrosis. Bladder is unremarkable. Stomach/Bowel: There is a large hiatal hernia with the gastric body and antrum intrathoracic in location. Air-fluid levels are noted in the stomach. The gastric fundus and proximal body are intra-abdominal in location. Scattered diverticula are present along the colon without evidence of diverticulitis. The appendix is not visualized on exam. No free air or pneumatosis is seen. Vascular/Lymphatic: Aortic atherosclerosis. No enlarged abdominal or pelvic lymph nodes. Reproductive: Uterus and bilateral adnexa are unremarkable. Other: No free fluid. A small fat containing inguinal hernia is noted on the left. Musculoskeletal: Degenerative changes in the lumbar spine. No acute osseous abnormality is seen. IMPRESSION: 1. Large hiatal hernia with the gastric body and antrum intrathoracic in location and air-fluid levels in the stomach, unchanged from the previous exam. Findings are concerning for gastric volvulus or early obstruction. Surgical consultation is recommended. 2. Coronary artery calcifications. 3. Stable compression deformity in the inferior endplate of T12. Electronically Signed   By: Thornell Sartorius M.D.   On: 03/10/2021 01:02   DG Chest Port 1 View  Result Date: 03/09/2021 CLINICAL DATA:  Chest pain EXAM: PORTABLE CHEST 1 VIEW COMPARISON:  02/16/2021 FINDINGS: Cardiac shadow is stable. Pacing device is again noted. Large hiatal hernia is again seen. Lungs are clear bilaterally. No bony abnormality is noted. IMPRESSION: Stable large hiatal hernia.  No new focal abnormality is noted.  Electronically Signed   By: Alcide Clever M.D.   On: 03/09/2021 23:17   ECHOCARDIOGRAM COMPLETE  Result Date: 03/10/2021    ECHOCARDIOGRAM REPORT   Patient Name:   Danielle Blanchard Date of Exam: 03/10/2021 Medical Rec #:  [id number]   Height:       62.0 in Accession #:    [id number]  Weight:       158.3 lb Date of Birth:  1940-12-07   BSA:          1.731 m Patient Age:    80 years    BP:           118/58 mmHg Patient Gender: F           HR:  60 bpm. Exam Location:  Inpatient Procedure: Cardiac Doppler, Color Doppler, 3D Echo and 2D Echo STAT ECHO Indications:    CHF  History:        Patient has no prior history of Echocardiogram examinations.                 CHF; Arrythmias:Atrial Fibrillation.  Sonographer:    Glo Herring Referring Phys: [place]  1. Left ventricular ejection fraction, by estimation, is 60 to 65%. The left ventricle has normal function. The left ventricle has no regional wall motion abnormalities. Left ventricular diastolic parameters are consistent with Grade II diastolic dysfunction (pseudonormalization).  2. Right ventricular systolic function is normal. The right ventricular size is normal.  3. Left atrial size was moderate to severe.  4. Right atrial size was moderately dilated.  5. The mitral valve is grossly normal. Mild mitral valve regurgitation.  6. Focal calcification. P1/2 T 501 ms. The aortic valve is calcified. Aortic valve regurgitation is mild to moderate.  7. The inferior vena cava is normal in size with greater than 50% respiratory variability, suggesting right atrial pressure of 3 mmHg. Comparison(s): No prior Echocardiogram. FINDINGS  Left Ventricle: Left ventricular ejection fraction, by estimation, is 60 to 65%. The left ventricle has normal function. The left ventricle has no regional wall motion abnormalities. The left ventricular internal cavity size was normal in size. There is  no left ventricular hypertrophy. Left ventricular diastolic  parameters are consistent with Grade II diastolic dysfunction (pseudonormalization). Right Ventricle: The right ventricular size is normal. No increase in right ventricular wall thickness. Right ventricular systolic function is normal. Left Atrium: Left atrial size was moderate to severe. Right Atrium: Right atrial size was moderately dilated. Pericardium: There is no evidence of pericardial effusion. Mitral Valve: The mitral valve is grossly normal. Mild mitral annular calcification. Mild mitral valve regurgitation. MV peak gradient, 7.2 mmHg. The mean mitral valve gradient is 2.5 mmHg. Tricuspid Valve: The tricuspid valve is grossly normal. Tricuspid valve regurgitation is mild. Aortic Valve: Focal calcification. P1/2 T 501 ms. The aortic valve is calcified. Aortic valve regurgitation is mild to moderate. Aortic regurgitation PHT measures 501 msec. Aortic valve mean gradient measures 4.0 mmHg. Aortic valve peak gradient measures  6.7 mmHg. Aortic valve area, by VTI measures 1.89 cm. Pulmonic Valve: The pulmonic valve was grossly normal. Pulmonic valve regurgitation is mild. Aorta: The aortic root and ascending aorta are structurally normal, with no evidence of dilitation. Venous: The inferior vena cava is normal in size with greater than 50% respiratory variability, suggesting right atrial pressure of 3 mmHg. IAS/Shunts: No atrial level shunt detected by color flow Doppler. Additional Comments: A device lead is visualized.  LEFT VENTRICLE PLAX 2D LVIDd:         5.00 cm     Diastology LVIDs:         3.20 cm     LV e' medial:    6.62 cm/s LV PW:         0.80 cm     LV E/e' medial:  16.5 LV IVS:        0.90 cm     LV e' lateral:   7.37 cm/s LVOT diam:     2.00 cm     LV E/e' lateral: 14.8 LV SV:         63 LV SV Index:   36 LVOT Area:     3.14 cm  LV Volumes (MOD) LV vol d,  MOD A2C: 77.8 ml LV vol d, MOD A4C: 95.6 ml LV vol s, MOD A2C: 32.5 ml LV vol s, MOD A4C: 35.4 ml LV SV MOD A2C:     45.3 ml LV SV MOD A4C:      95.6 ml LV SV MOD BP:      52.9 ml RIGHT VENTRICLE             IVC RV Basal diam:  3.70 cm     IVC diam: 1.70 cm RV S prime:     12.80 cm/s LEFT ATRIUM             Index        RIGHT ATRIUM           Index LA diam:        3.90 cm 2.25 cm/m   RA Area:     18.40 cm LA Vol (A2C):   89.2 ml 51.54 ml/m  RA Volume:   45.40 ml  26.23 ml/m LA Vol (A4C):   67.8 ml 39.17 ml/m LA Biplane Vol: 80.4 ml 46.45 ml/m  AORTIC VALVE                    PULMONIC VALVE AV Area (Vmax):    1.88 cm     PV Vmax:       0.74 m/s AV Area (Vmean):   1.74 cm     PV Peak grad:  2.2 mmHg AV Area (VTI):     1.89 cm AV Vmax:           129.00 cm/s AV Vmean:          90.600 cm/s AV VTI:            0.331 m AV Peak Grad:      6.7 mmHg AV Mean Grad:      4.0 mmHg LVOT Vmax:         77.00 cm/s LVOT Vmean:        50.200 cm/s LVOT VTI:          0.199 m LVOT/AV VTI ratio: 0.60 AI PHT:            501 msec  AORTA Ao Root diam: 3.20 cm Ao Asc diam:  3.60 cm MITRAL VALVE                TRICUSPID VALVE MV Area (PHT): 3.36 cm     TR Peak grad:   26.2 mmHg MV Area VTI:   1.52 cm     TR Vmax:        256.00 cm/s MV Peak grad:  7.2 mmHg MV Mean grad:  2.5 mmHg     SHUNTS MV Vmax:       1.34 m/s     Systemic VTI:  0.20 m MV Vmean:      73.4 cm/s    Systemic Diam: 2.00 cm MV Decel Time: 226 msec MR PISA:        2.26 cm MR PISA Radius: 0.60 cm MV E velocity: 109.00 cm/s MV A velocity: 60.40 cm/s MV E/A ratio:  1.80 Landscape architect signed by Phineas Inches Signature Date/Time: 03/10/2021/12:53:06 PM    Final

## 2021-03-13 NOTE — Progress Notes (Addendum)
PHARMACY - TOTAL PARENTERAL NUTRITION CONSULT NOTE   Indication: Persistent large hiatal hernia w/ gastric volvulus &  possible obstruction  Patient Measurements: Height: 5\' 2"  (157.5 cm) Weight: 71.8 kg (158 lb 4.6 oz) IBW/kg (Calculated) : 50.1 TPN AdjBW (KG): 55.5 Body mass index is 28.95 kg/m. Usual Weight: ~110 lbs per patient but standing weight 154 lb 12/12    Assessment:  80 yo W with gastric volvulus since November with unsuccessful EGD. Pending final plan for surgical reduction and repair. Patient cannot recall many specifics but states that prior to admission she was only eating 1 meal a day at dinner. She would rarely eat some cereal in the morning. She could not recall what she would eat for dinner or for how long she's only eating one meal a day but knows she didn't eat much even at dinner. Patient has been receiving MIVF with dextrose x2 days, which decreases refeeding risk. Pharmacy consulted for TPN.   Glucose / Insulin:  BG <100 while NPO, has been receiving 72g dextrose/24hr via MIVF since 12/10  Electrolytes: K 4.1 (s/p 40 mEq PO and 14.4 mEq/24hr via MIVF), Mg 2 (s/p 1g IV), CoCa 9.6, others wnl  Renal: Scr 0.7 stable, BUN 5 Hepatic: LFTs wnl Intake / Output; MIVF: D5halfNS w. 10 K at 60 ml/hr, UOP x6, LBM 12/11 GI Imaging:00 11/12 CT: Large hiatal hernia with air-fluid in stomach, concerning for gastric volvulus or early obstruction 11/15 KUB: Nonobstructive bowel gas pattern  12/9 CT: Large hiatal hernia with air-fluid in stomach, concerning for gastric volvulus or early obstruction GI Surgeries / Procedures:  11/14 EGD: intrathoracic stomach and gastric volvulus, but no changes of ischemia and no tight angulation   Central access: 12/12 PICC ordered TPN start date: 12/12   Nutritional Goals: Goal TPN rate is 70 mL/hr (provides 92 g of protein, 54 g lipids, 260 g dextrose and 1793 kcals per day)  RD Assessment: pending    Pharmacist goals (used adjusted  weight 58kg)  Kcal 1700- 2000 Protein 85- 100 g  Fluids >1500 ml/d  Current Nutrition:  NPO and TPN  Plan:  Start TPN at 45 mL/hr at 1800, provides 59 g protein, 35 g lipid, 167 g dextrose, 1153 kCal, meeting ~65% of patient's needs  Electrolytes in TPN: Na 75 mEq/L, K 50 mEq/L, Ca 5 mEq/L, Mg 10 mEq/L, and Phos 15 mmol/L. Cl:Ac 1:1 Add standard MVI and trace elements to TPN Add thiamine x3 days (12/12 - 12/14) for refeeding risk  Initiate Sensitive q6h SSI and adjust as needed  Stop D5halfNS+87mEq K at 17:59 and start halfNS at15 mL/hr at 1800 Monitor TPN labs daily until stable at goal then on Mon/Thurs    ADDENDUM : Spoke with Surgery and TR[facility], plan is to HOLD TPN and PICC line for now. Will see if patient can tolerate diet after surgery 12/13. TPN orders discontinued     1/14, PharmD, BCPS, [facility] Clinical Pharmacist  Please check AMION for all [facility] Pharmacy phone numbers After 10:00 PM, call Main Pharmacy [phone]

## 2021-03-13 NOTE — Progress Notes (Signed)
Occupational Therapy Treatment Patient Details Name: Danielle Blanchard MRN: [id number] DOB: [birth year] Today's Date: 03/13/2021   History of present illness 80 y.o female with medical history significant of sick sinus syndrome post PPM, chronic diastolic CHF, hypertension, hyperlipidemia, GERD.  Recently admitted on 11/12 for left upper quadrant abdominal pain.  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  Patient was seen by GI and underwent decompression by endoscopy on 11/14.  She returns to the ED with left-sided chest pain.  Chest x-ray showing stable large hiatal hernia.  CT chest/abdomen/pelvis showing large hiatal hernia.  GI considering repeat surgical evaluation.   OT comments  Pt eager to mobilize/get up to bathroom upon arrival, pt completed ADLs with supervision-min guard with RW. Pt able to walk household and hallway distance with seated rest break x1. Pt denies pain, however is minimally limited by impaired balance and activity tolerance at this time. Will continue to follow acutely to maximize safety with ADLs and functional mobility. Recommend safe d/c home with assistance.   Recommendations for follow up therapy are one component of a multi-disciplinary discharge planning process, led by the attending physician.  Recommendations may be updated based on patient status, additional functional criteria and insurance authorization.    Follow Up Recommendations  No OT follow up    Assistance Recommended at Discharge Intermittent Supervision/Assistance  Equipment Recommendations  Tub/shower seat    Recommendations for Other Services      Precautions / Restrictions Precautions Precautions: Fall Restrictions Weight Bearing Restrictions: No       Mobility Bed Mobility               General bed mobility comments: up in chair upon arrival    Transfers Overall transfer level: Needs assistance Equipment used: Rolling walker (2  wheels) Transfers: Sit to/from Stand Sit to Stand: Min guard           General transfer comment: increased time, requries cues for hand placement     Balance Overall balance assessment: Needs assistance Sitting-balance support: Feet supported Sitting balance-Leahy Scale: Good     Standing balance support: Single extremity supported Standing balance-Leahy Scale: Fair                             ADL either performed or assessed with clinical judgement   ADL Overall ADL's : Needs assistance/impaired   Eating/Feeding Details (indicate cue type and reason): pt NPO Grooming: Wash/dry hands;Min guard;Standing Grooming Details (indicate cue type and reason): completed standing sinkside             Lower Body Dressing: Min guard;Sit to/from stand   Toilet Transfer: Min guard;Rolling walker (2 wheels);Comfort height toilet;Ambulation   Toileting- Clothing Manipulation and Hygiene: Supervision/safety;Sitting/lateral lean;Sit to/from stand              Extremity/Trunk Assessment Upper Extremity Assessment Upper Extremity Assessment: Overall WFL for tasks assessed   Lower Extremity Assessment Lower Extremity Assessment: Defer to PT evaluation        Vision       Perception Perception Perception: Not tested   Praxis Praxis Praxis: Not tested    Cognition Arousal/Alertness: Awake/alert Behavior During Therapy: WFL for tasks assessed/performed Overall Cognitive Status: History of cognitive impairments - at baseline  General Comments: 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          Exercises     Shoulder Instructions       General Comments VSS    Pertinent Vitals/ Pain       Pain Assessment: No/denies pain  Home Living                                          Prior Functioning/Environment              Frequency  Min 2X/week        Progress Toward Goals  OT  Goals(current goals can now be found in the care plan section)  Progress towards OT goals: Progressing toward goals  Acute Rehab OT Goals Patient Stated Goal: To go home OT Goal Formulation: With patient Time For Goal Achievement: 03/24/21 Potential to Achieve Goals: Good  Plan Discharge plan remains appropriate;Frequency remains appropriate    Co-evaluation                 AM-PAC OT "6 Clicks" Daily Activity     Outcome Measure   Help from another person eating meals?: None Help from another person taking care of personal grooming?: A Little Help from another person toileting, which includes using toliet, bedpan, or urinal?: A Little Help from another person bathing (including washing, rinsing, drying)?: A Little Help from another person to put on and taking off regular upper body clothing?: None Help from another person to put on and taking off regular lower body clothing?: A Little 6 Click Score: 20    End of Session Equipment Utilized During Treatment: Gait belt;Rolling walker (2 wheels)  OT Visit Diagnosis: Unsteadiness on feet (R26.81)   Activity Tolerance Patient tolerated treatment well   Patient Left in chair;with call bell/phone within reach;Other (comment) (does not need chair alarm per RN)   Nurse Communication Mobility status        Time: 4332-9518 OT Time Calculation (min): 34 min  Charges: OT General Charges $OT Visit: 1 Visit OT Treatments $Self Care/Home Management : 8-22 mins $Therapeutic Activity: 8-22 mins  Alfonzo Beers, OTD, OTR/L Acute Rehab [phone]) [phone]   Mayer Masker 03/13/2021, 12:35 PM

## 2021-03-13 NOTE — Progress Notes (Addendum)
Progress Note     Subjective: Pt resting comfortably this AM. She denies chest pain or abdominal pain. Denies n/v. She reports she has not had a BM since she has been here.   Objective: Vital signs in last 24 hours: Temp:  [97.7 F (36.5 C)-98.5 F (36.9 C)] 98.5 F (36.9 C) (12/12 0735) Pulse Rate:  [60-74] 74 (12/12 0735) Resp:  [16-17] 16 (12/12 0735) BP: (114-144)/(55-87) 114/81 (12/12 0735) SpO2:  [93 %-97 %] 97 % (12/12 0735) Last BM Date: 03/09/21  Intake/Output from previous day: 12/11 0701 - 12/12 0700 In: 655.8 [I.V.:655.8] Out: 200 [Urine:200] Intake/Output this shift: No intake/output data recorded.  PE: General: pleasant, WD, elderly female who is laying in bed in NAD Heart: regular, rate, and rhythm.   Lungs: CTAB, no wheezes, rhonchi, or rales noted.  Respiratory effort nonlabored Abd: soft, NT, ND, +BS, no masses, ventral hernias, or organomegaly Skin: warm and dry with no masses, lesions, or rashes   Lab Results:  Recent Labs    03/12/21 0053 03/13/21 0053  WBC 5.8 5.2  HGB 10.4* 9.2*  HCT 30.9* 28.7*  PLT 138* 142*   BMET Recent Labs    03/12/21 0053 03/13/21 0053  NA 137 140  K 3.5 4.1  CL 101 107  CO2 25 25  GLUCOSE 85 92  BUN 7* 5*  CREATININE 0.75 0.68  CALCIUM 8.7* 8.8*   PT/INR No results for input(s): LABPROT, INR in the last 72 hours. CMP     Component Value Date/Time   NA 140 03/13/2021 0053   K 4.1 03/13/2021 0053   CL 107 03/13/2021 0053   CO2 25 03/13/2021 0053   GLUCOSE 92 03/13/2021 0053   BUN 5 (L) 03/13/2021 0053   CREATININE 0.68 03/13/2021 0053   CALCIUM 8.8 (L) 03/13/2021 0053   PROT 5.6 (L) 03/13/2021 0053   ALBUMIN 3.0 (L) 03/13/2021 0053   AST 35 03/13/2021 0053   ALT 36 03/13/2021 0053   ALKPHOS 65 03/13/2021 0053   BILITOT 0.7 03/13/2021 0053   GFRNONAA >60 03/13/2021 0053   GFRAA >60 12/05/2019 2254   Lipase     Component Value Date/Time   LIPASE 42 02/11/2021 2017        Studies/Results: No results found.  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 Pt has been NPO since 12/9, will discuss with MD  FEN: NPO, IVF per [facility] VTE: heparin gtt ID: no current abx  Sick sinus syndrome s/p pacemaker Paroxysmal A. Fib on eliquis - eliquis on hold Chronic diastolic CHF - EF is 60-65% on echo 12/9 HTN HLD GERD  LOS: 3 days    Juliet Rude, [facility] Surgery 03/13/2021, 7:58 AM Please see Amion for pager number during day hours 7:00am-4:30pm   Addendum: Plan is for robotic assisted hiatal hernia repair tomorrow with Dr. Daphine Deutscher around 10 AM. I notified patient and patient's son, Lenell Antu, of this. I was in patient's room and her son was on speaker phone. Answered all surgically related questions and discussed risks and benefits of proceeding. Patient and son are agreeable to proceed.  Juliet Rude, [facility] Surgery 03/13/2021, 1:53 PM Please see Amion for pager number during day hours 7:00am-4:30pm

## 2021-03-13 NOTE — Consult Note (Signed)
CARDIOLOGY CONSULT NOTE       Patient ID: Niyah Mamaril MRN: [id number] DOB/AGE: 1940-07-21 80 y.o.  Admit date: 03/09/2021 Referring Physician: Thedore Mins Primary Physician: [facility] Primary Cardiologist: [facility] Reason for Consultation: Preoperative/PAF/PPM  Principal Problem:   Gastric volvulus Active Problems:   Large hiatal hernia   A-fib [facility])   Sick sinus syndrome (HCC)   CHF (congestive heart failure) (HCC)   Cardiomyopathy, idiopathic (HCC)   Essential hypertension   Memory changes   HPI:  80 y.o. with history of PAF on eliquis with CHADVASC 5. SSS with PPM placed in 2019 Medtronic Last TEE/DCC 2019 and [facility]ACEART records show maintenance of NSR She has been on oral amiodarone No history of CAD. TTE here shows normal EF with mild MR and mild - moderate AR similar to echo done in [facility] 2020.  She has a large hiatal hernia with volvulus and signs of obstruction on CT. She denies abdominal pain, nausea , vomiting and does not have and NG tube in. She can do all ADL , 5 METS and has no syncope, palpitations or chest pain Mild chronic exertional dyspnea. Her eliquis has been held and transitioned to heparin for planned surgery tomorrow. Her telemetry and ECG show normal AV pacing   ROS All other systems reviewed and negative except as noted above  Past Medical History:  Diagnosis Date   High cholesterol    Hypertension     History reviewed. No pertinent family history.  Social History   Socioeconomic History   Marital status: Widowed    Spouse name: Not on file   Number of children: Not on file   Years of education: Not on file   Highest education level: Not on file  Occupational History   Not on file  Tobacco Use   Smoking status: Never   Smokeless tobacco: Never  Vaping Use   Vaping Use: Never used  Substance and Sexual Activity   Alcohol use: Never   Drug use: Never   Sexual activity: Not on file  Other Topics Concern   Not  on file  Social History Narrative   Not on file   Social Determinants of Health   Financial Resource Strain: Not on file  Food Insecurity: Not on file  Transportation Needs: Not on file  Physical Activity: Not on file  Stress: Not on file  Social Connections: Not on file  Intimate Partner Violence: Not on file    Past Surgical History:  Procedure Laterality Date   ESOPHAGOGASTRODUODENOSCOPY (EGD) WITH PROPOFOL N/A 02/13/2021   Procedure: ESOPHAGOGASTRODUODENOSCOPY (EGD) WITH PROPOFOL;  Surgeon: Willis Modena, MD;  Location: [facility];  Service: Gastroenterology;  Laterality: N/A;   PACEMAKER PLACEMENT        Current Facility-Administered Medications:    0.45 % sodium chloride infusion, , Intravenous, Continuous, Leander Rams, RPH   amiodarone (PACERONE) tablet 200 mg, 200 mg, Oral, Daily, Susa Raring K, MD, 200 mg at 03/13/21 1003   carvedilol (COREG) tablet 3.125 mg, 3.125 mg, Oral, BID WC, Leroy Sea, MD, 3.125 mg at 03/13/21 0825   [START ON 03/14/2021] ceFAZolin (ANCEF) IVPB 2g/100 mL premix, 2 g, Intravenous, On Call to OR, Barnetta Chapel, PA-C   dextrose 5 % and 0.45 % NaCl with KCl 10 mEq/L infusion, , Intravenous, Continuous, Leander Rams, [facility], Last Rate: 60 mL/hr at 03/13/21 0433, Infusion Verify at 03/13/21 0433   docusate sodium (COLACE) capsule 100 mg, 100 mg, Oral, BID, Juliet Rude, PA-C,  100 mg at 03/13/21 1003   heparin ADULT infusion 100 units/mL (25000 units/235mL), 850 Units/hr, Intravenous, Continuous, Saverio Danker, PA-C, Last Rate: 8.5 mL/hr at 03/13/21 0433, 850 Units/hr at 03/13/21 0433   hydrALAZINE (APRESOLINE) injection 10 mg, 10 mg, Intravenous, Q6H PRN, Thurnell Lose, MD   [START ON 03/14/2021] insulin aspart (novoLOG) injection 0-9 Units, 0-9 Units, Subcutaneous, Q6H, Donnamae Jude, RPH   levothyroxine (SYNTHROID) tablet 75 mcg, 75 mcg, Oral, Q0600, Thurnell Lose, MD, 75 mcg at 03/13/21 0550   metoprolol tartrate  (LOPRESSOR) injection 5 mg, 5 mg, Intravenous, Q8H PRN, Thurnell Lose, MD   morphine 2 MG/ML injection 1 mg, 1 mg, Intravenous, Q4H PRN, Shela Leff, MD   pantoprazole (PROTONIX) injection 40 mg, 40 mg, Intravenous, Q24H, Thurnell Lose, MD, 40 mg at 03/13/21 1003   promethazine (PHENERGAN) 12.5 mg in sodium chloride 0.9 % 50 mL IVPB, 12.5 mg, Intravenous, Q6H PRN, Shela Leff, MD   rosuvastatin (CRESTOR) tablet 20 mg, 20 mg, Oral, Daily, Thurnell Lose, MD, 20 mg at 03/13/21 1003   TPN ADULT (ION), , Intravenous, Continuous TPN, Donnamae Jude, RPH   white petrolatum (VASELINE) gel, , , ,   amiodarone  200 mg Oral Daily   carvedilol  3.125 mg Oral BID WC   docusate sodium  100 mg Oral BID   [START ON 03/14/2021] insulin aspart  0-9 Units Subcutaneous Q6H   levothyroxine  75 mcg Oral Q0600   pantoprazole (PROTONIX) IV  40 mg Intravenous Q24H   rosuvastatin  20 mg Oral Daily   white petrolatum        sodium chloride     [START ON 03/14/2021]  ceFAZolin (ANCEF) IV     dextrose 5 % and 0.45 % NaCl with KCl 10 mEq/L 60 mL/hr at 03/13/21 0433   heparin 850 Units/hr (03/13/21 0433)   promethazine (PHENERGAN) injection (IM or IVPB)     TPN ADULT (ION)      Physical Exam: Blood pressure 114/81, pulse 74, temperature 98.5 F (36.9 C), temperature source Oral, resp. rate 16, height 5\' 2"  (1.575 m), weight 70.2 kg, SpO2 97 %.    Affect appropriate Elderly female  HEENT: normal Neck supple with no adenopathy JVP normal no bruits no thyromegaly Lungs clear with no wheezing and good diaphragmatic motion Heart:  S1/S2  SEM AR murmur PPM under left clavicle  Abdomen: benighn, BS positve, no tenderness, no AAA no bruit.  No HSM or HJR Distal pulses intact with no bruits No edema Neuro non-focal Skin warm and dry No muscular weakness   Labs:   Lab Results  Component Value Date   WBC 5.2 03/13/2021   HGB 9.2 (L) 03/13/2021   HCT 28.7 (L) 03/13/2021   MCV 96.0  03/13/2021   PLT 142 (L) 03/13/2021    Recent Labs  Lab 03/13/21 0053  NA 140  K 4.1  CL 107  CO2 25  BUN 5*  CREATININE 0.68  CALCIUM 8.8*  PROT 5.6*  BILITOT 0.7  ALKPHOS 65  ALT 36  AST 35  GLUCOSE 92   No results found for: CKTOTAL, CKMB, CKMBINDEX, TROPONINI No results found for: CHOL No results found for: HDL No results found for: LDLCALC No results found for: TRIG No results found for: CHOLHDL No results found for: LDLDIRECT    Radiology: CT ABDOMEN PELVIS W CONTRAST  Result Date: 02/11/2021 CLINICAL DATA:  Evaluate for bowel obstruction. Left upper quadrant abdominal pain. EXAM: CT ABDOMEN  AND PELVIS WITH CONTRAST TECHNIQUE: Multidetector CT imaging of the abdomen and pelvis was performed using the standard protocol following bolus administration of intravenous contrast. CONTRAST:  52mL OMNIPAQUE IOHEXOL 350 MG/ML SOLN COMPARISON:  CT abdomen and pelvis 12/28/2020. FINDINGS: Lower chest: No acute abnormality. Hepatobiliary: No focal liver abnormality is seen. Status post cholecystectomy. No biliary dilatation. Pancreas: Unremarkable. No pancreatic ductal dilatation or surrounding inflammatory changes. Spleen: Normal in size without focal abnormality. Adrenals/Urinary Tract: Adrenal glands are unremarkable. Kidneys are normal, without renal calculi, focal lesion, or hydronephrosis. Bladder is unremarkable. Stomach/Bowel: There is sigmoid colon diverticulosis without evidence for diverticulitis. The appendix is likely surgically absent. Small bowel and colon are nondilated. Again seen is a large hiatal hernia. The gastric body and fundus are now below the diaphragm, new finding. The gastric antrum remains within the hernia. The intrathoracic and intra-abdominal portions of the stomach are dilated with air-fluid levels. There is no pneumatosis or free air. Vascular/Lymphatic: Aortic atherosclerosis. No enlarged abdominal or pelvic lymph nodes. Reproductive: Uterus and bilateral  adnexa are unremarkable. Other: No abdominal wall hernia or abnormality. There is a small fat containing left inguinal hernia. There is no ascites. Musculoskeletal: T12 inferior endplate compression fracture has increased when compared to the prior study. IMPRESSION: 1. Large hiatal hernia is again seen. In the interval, the gastric fundus has change locations and is now intraabdominal while the gastric antrum remains in the hernia. Both intrathoracic and intra-abdominal portions of the stomach are dilated with air-fluid levels. 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 Surgical consultation recommended. 2. T12 compression fracture has increased compared to 12/28/2020. Electronically Signed   By: Ronney Asters M.D.   On: 02/11/2021 23:25   CT CHEST ABDOMEN PELVIS W CONTRAST  Result Date: 03/10/2021 CLINICAL DATA:  Chest pain.  History of hernia. EXAM: CT CHEST, ABDOMEN, AND PELVIS WITH CONTRAST TECHNIQUE: Multidetector CT imaging of the chest, abdomen and pelvis was performed following the standard protocol during bolus administration of intravenous contrast. CONTRAST:  177mL OMNIPAQUE IOHEXOL 300 MG/ML  SOLN COMPARISON:  02/11/2021. FINDINGS: CT CHEST FINDINGS Cardiovascular: The heart is normal in size and there is no pericardial effusion. Scattered coronary artery calcifications are noted. There is a left-sided pacemaker with leads terminating in the heart. There is mild atherosclerotic calcification of the aorta without evidence of aneurysm. Pulmonary trunk is normal in caliber. Mediastinum/Nodes: No enlarged mediastinal, hilar, or axillary lymph nodes. Thyroid gland, trachea, and esophagus demonstrate no significant findings. There is a large hiatal hernia, unchanged from the prior exam. Lungs/Pleura: Apical pleural scarring and mild subpleural fibrosis is noted bilaterally. Mild atelectasis is present in the lower lobes bilaterally adjacent to the hernia pouch. No effusion or  pneumothorax. Musculoskeletal: An old rib fracture is noted at T10 on the right. Degenerative changes are present in the thoracic spine. A compression deformity is present in the inferior endplate of 624THL which is unchanged from the prior exam. CT ABDOMEN PELVIS FINDINGS Hepatobiliary: No focal liver abnormality is seen. There is mild biliary ductal dilatation which is likely related to patient's post cholecystectomy status. Pancreas: Unremarkable. No pancreatic ductal dilatation or surrounding inflammatory changes. Spleen: Normal in size without focal abnormality. Adrenals/Urinary Tract: Adrenal glands are unremarkable. Kidneys are normal, without renal calculi, focal lesion, or hydronephrosis. Bladder is unremarkable. Stomach/Bowel: There is a large hiatal hernia with the gastric body and antrum intrathoracic in location. Air-fluid levels are noted in the stomach. The gastric fundus and proximal body are intra-abdominal in location. Scattered diverticula  are present along the colon without evidence of diverticulitis. The appendix is not visualized on exam. No free air or pneumatosis is seen. Vascular/Lymphatic: Aortic atherosclerosis. No enlarged abdominal or pelvic lymph nodes. Reproductive: Uterus and bilateral adnexa are unremarkable. Other: No free fluid. A small fat containing inguinal hernia is noted on the left. Musculoskeletal: Degenerative changes in the lumbar spine. No acute osseous abnormality is seen. IMPRESSION: 1. Large hiatal hernia with the gastric body and antrum intrathoracic in location and air-fluid levels in the stomach, unchanged from the previous exam. Findings are concerning for gastric volvulus or early obstruction. Surgical consultation is recommended. 2. Coronary artery calcifications. 3. Stable compression deformity in the inferior endplate of 624THL. Electronically Signed   By: Brett Fairy M.D.   On: 03/10/2021 01:02   DG Chest Port 1 View  Result Date: 03/09/2021 CLINICAL DATA:   Chest pain EXAM: PORTABLE CHEST 1 VIEW COMPARISON:  02/16/2021 FINDINGS: Cardiac shadow is stable. Pacing device is again noted. Large hiatal hernia is again seen. Lungs are clear bilaterally. No bony abnormality is noted. IMPRESSION: Stable large hiatal hernia.  No new focal abnormality is noted. Electronically Signed   By: Inez Catalina M.D.   On: 03/09/2021 23:17   DG CHEST PORT 1 VIEW  Result Date: 02/16/2021 CLINICAL DATA:  Hemoptysis. EXAM: PORTABLE CHEST 1 VIEW COMPARISON:  Chest x-ray 12/05/2019. CT abdomen and pelvis 02/11/2021. FINDINGS: Left-sided pacemaker is again noted. The heart is enlarged, unchanged. There is a large hiatal hernia similar to the prior study. There is a new small right pleural effusion with right lower lung dense atelectasis or airspace disease. There is no evidence for pneumothorax. No acute fractures are seen. IMPRESSION: 1. New small right pleural effusion with new dense right lower lung atelectasis/airspace disease. 2. Stable cardiomegaly. 3. Stable large hiatal hernia. Electronically Signed   By: Ronney Asters M.D.   On: 02/16/2021 15:33   DG Abd Portable 1V  Result Date: 02/14/2021 CLINICAL DATA:  Lower abdominal pain EXAM: PORTABLE ABDOMEN - 1 VIEW COMPARISON:  CT abdomen/pelvis 02/11/2021 FINDINGS: There is a nonobstructive bowel gas pattern in the imaged abdomen. Note that the left upper quadrant is incompletely imaged. There is no abnormal soft tissue calcification. Cholecystectomy clips are noted. There is no acute osseous abnormality. IMPRESSION: Nonobstructive bowel gas pattern in the imaged abdomen. Electronically Signed   By: Valetta Mole M.D.   On: 02/14/2021 09:02   ECHOCARDIOGRAM COMPLETE  Result Date: 03/10/2021    ECHOCARDIOGRAM REPORT   Patient Name:   JORGE LUMIA Date of Exam: 03/10/2021 Medical Rec #:  [id number]   Height:       62.0 in Accession #:    [id number]  Weight:       158.3 lb Date of Birth:  [birth year]   BSA:          1.731 m Patient Age:     17 years    BP:           118/58 mmHg Patient Gender: F           HR:           60 bpm. Exam Location:  Inpatient Procedure: Cardiac Doppler, Color Doppler, 3D Echo and 2D Echo STAT ECHO Indications:    CHF  History:        Patient has no prior history of Echocardiogram examinations.                 CHF; Arrythmias:Atrial Fibrillation.  Sonographer:  Glo Herring Referring Phys: [place]  1. Left ventricular ejection fraction, by estimation, is 60 to 65%. The left ventricle has normal function. The left ventricle has no regional wall motion abnormalities. Left ventricular diastolic parameters are consistent with Grade II diastolic dysfunction (pseudonormalization).  2. Right ventricular systolic function is normal. The right ventricular size is normal.  3. Left atrial size was moderate to severe.  4. Right atrial size was moderately dilated.  5. The mitral valve is grossly normal. Mild mitral valve regurgitation.  6. Focal calcification. P1/2 T 501 ms. The aortic valve is calcified. Aortic valve regurgitation is mild to moderate.  7. The inferior vena cava is normal in size with greater than 50% respiratory variability, suggesting right atrial pressure of 3 mmHg. Comparison(s): No prior Echocardiogram. FINDINGS  Left Ventricle: Left ventricular ejection fraction, by estimation, is 60 to 65%. The left ventricle has normal function. The left ventricle has no regional wall motion abnormalities. The left ventricular internal cavity size was normal in size. There is  no left ventricular hypertrophy. Left ventricular diastolic parameters are consistent with Grade II diastolic dysfunction (pseudonormalization). Right Ventricle: The right ventricular size is normal. No increase in right ventricular wall thickness. Right ventricular systolic function is normal. Left Atrium: Left atrial size was moderate to severe. Right Atrium: Right atrial size was moderately dilated. Pericardium: There is no  evidence of pericardial effusion. Mitral Valve: The mitral valve is grossly normal. Mild mitral annular calcification. Mild mitral valve regurgitation. MV peak gradient, 7.2 mmHg. The mean mitral valve gradient is 2.5 mmHg. Tricuspid Valve: The tricuspid valve is grossly normal. Tricuspid valve regurgitation is mild. Aortic Valve: Focal calcification. P1/2 T 501 ms. The aortic valve is calcified. Aortic valve regurgitation is mild to moderate. Aortic regurgitation PHT measures 501 msec. Aortic valve mean gradient measures 4.0 mmHg. Aortic valve peak gradient measures  6.7 mmHg. Aortic valve area, by VTI measures 1.89 cm. Pulmonic Valve: The pulmonic valve was grossly normal. Pulmonic valve regurgitation is mild. Aorta: The aortic root and ascending aorta are structurally normal, with no evidence of dilitation. Venous: The inferior vena cava is normal in size with greater than 50% respiratory variability, suggesting right atrial pressure of 3 mmHg. IAS/Shunts: No atrial level shunt detected by color flow Doppler. Additional Comments: A device lead is visualized.  LEFT VENTRICLE PLAX 2D LVIDd:         5.00 cm     Diastology LVIDs:         3.20 cm     LV e' medial:    6.62 cm/s LV PW:         0.80 cm     LV E/e' medial:  16.5 LV IVS:        0.90 cm     LV e' lateral:   7.37 cm/s LVOT diam:     2.00 cm     LV E/e' lateral: 14.8 LV SV:         63 LV SV Index:   36 LVOT Area:     3.14 cm  LV Volumes (MOD) LV vol d, MOD A2C: 77.8 ml LV vol d, MOD A4C: 95.6 ml LV vol s, MOD A2C: 32.5 ml LV vol s, MOD A4C: 35.4 ml LV SV MOD A2C:     45.3 ml LV SV MOD A4C:     95.6 ml LV SV MOD BP:      52.9 ml RIGHT VENTRICLE  IVC RV Basal diam:  3.70 cm     IVC diam: 1.70 cm RV S prime:     12.80 cm/s LEFT ATRIUM             Index        RIGHT ATRIUM           Index LA diam:        3.90 cm 2.25 cm/m   RA Area:     18.40 cm LA Vol (A2C):   89.2 ml 51.54 ml/m  RA Volume:   45.40 ml  26.23 ml/m LA Vol (A4C):   67.8 ml 39.17  ml/m LA Biplane Vol: 80.4 ml 46.45 ml/m  AORTIC VALVE                    PULMONIC VALVE AV Area (Vmax):    1.88 cm     PV Vmax:       0.74 m/s AV Area (Vmean):   1.74 cm     PV Peak grad:  2.2 mmHg AV Area (VTI):     1.89 cm AV Vmax:           129.00 cm/s AV Vmean:          90.600 cm/s AV VTI:            0.331 m AV Peak Grad:      6.7 mmHg AV Mean Grad:      4.0 mmHg LVOT Vmax:         77.00 cm/s LVOT Vmean:        50.200 cm/s LVOT VTI:          0.199 m LVOT/AV VTI ratio: 0.60 AI PHT:            501 msec  AORTA Ao Root diam: 3.20 cm Ao Asc diam:  3.60 cm MITRAL VALVE                TRICUSPID VALVE MV Area (PHT): 3.36 cm     TR Peak grad:   26.2 mmHg MV Area VTI:   1.52 cm     TR Vmax:        256.00 cm/s MV Peak grad:  7.2 mmHg MV Mean grad:  2.5 mmHg     SHUNTS MV Vmax:       1.34 m/s     Systemic VTI:  0.20 m MV Vmean:      73.4 cm/s    Systemic Diam: 2.00 cm MV Decel Time: 226 msec MR PISA:        2.26 cm MR PISA Radius: 0.60 cm MV E velocity: 109.00 cm/s MV A velocity: 60.40 cm/s MV E/A ratio:  1.80 Landscape architect signed by Phineas Inches Signature Date/Time: 03/10/2021/12:53:06 PM    Final    [place] EKG SITE RITE  Result Date: 03/13/2021 If Site Rite image not attached, placement could not be confirmed due to current cardiac rhythm.   EKG: AV pacing    ASSESSMENT AND PLAN:   Preoperative : clear to have surgery in am considered low risk with no history of CAD and normal EF PPM;  normal function Medtronic lower rate limit set 60 bpm Can use magnet in OR if needed to block sensing  PAF:  In sinus eliquis transitioned to heparin Since she is in sinus don't have to be in a rush to resume anticoagulation post operatively until sure wounds healing and no complications May need iv amiodarone as she has  been NPO and will be NPO postoperatively   Signed: Jenkins Rouge 03/13/2021, 1:06 PM

## 2021-03-13 NOTE — Progress Notes (Addendum)
Initial Nutrition Assessment  DOCUMENTATION CODES:   Not applicable  INTERVENTION:   Addendum (2:46 pm): Pharmacy reached out and plan is to hold off on TPN at this time. Plan to advance to clear liquids tomorrow post-op. RD will continue to follow and provide nutrition supplements as appropriate.    TPN management per Pharmacy Recommend continuing TPN until pt is able to adequately consume > 60% via PO Provide nutrition supplements as diet is advanced   NUTRITION DIAGNOSIS:   Inadequate oral intake related to inability to eat as evidenced by NPO status.  GOAL:   Patient will meet greater than or equal to 90% of their needs  MONITOR:   Diet advancement, I & O's, Labs  REASON FOR ASSESSMENT:   Consult New TPN/TNA  ASSESSMENT:   80 y.o. female presented to the ED with left chest pain; pt recently admitted 11/12 w/ large hiatal hernia, gastric volvulus, and an obstruction. PMH includes CHF, HTN, and GERD. Pt admitted with gastric volvulus versus early obstruction.    Pt reports that she was eating pretty good at home. States that she lives with her son. Pt reports a typical intake of: Breakfast: cereal or cookies and coffee Lunch: banana sandwich  Dinner: Out with son (ex. [employer] or [employer])  Pt reports that she usually drinks 1 Boost/day at home. Pt reports that she has dentures and denies any difficulty chewing or swallowing.  Pt unable to recall UBW, endorses some weight loss but unable to provide how much. Per EMR, no weights recorded within the past year. Pt reports that she has a walker and a cane but does not use them much. Pt stated that she has 17 steps to get to her apartment.   Discussed ONS with pt once diet is advanced; pt agreeable.   Per surgery note, plan for surgical repair of hernia due to previous conservative measures did not treat issue.   Medications reviewed and include: Colace, SSI 0-9 units q6h, Protonix, IV promethazine Labs  reviewed.  NUTRITION - FOCUSED PHYSICAL EXAM:  Flowsheet Row Most Recent Value  Orbital Region No depletion  Upper Arm Region No depletion  Thoracic and Lumbar Region No depletion  Buccal Region No depletion  Temple Region Mild depletion  Clavicle Bone Region Moderate depletion  Clavicle and Acromion Bone Region Moderate depletion  Scapular Bone Region Moderate depletion  Dorsal Hand Mild depletion  Patellar Region Mild depletion  Anterior Thigh Region Mild depletion  Posterior Calf Region Mild depletion  Edema (RD Assessment) None  Hair Reviewed  Eyes Reviewed  Mouth Reviewed  Skin Reviewed  Nails Unable to assess  [Nail Polish]      Diet Order:   Diet Order             Diet NPO time specified Except for: Sips with Meds  Diet effective now                   EDUCATION NEEDS:   Not appropriate for education at this time  Skin:  Skin Assessment: Reviewed RN Assessment  Last BM:  03/09/2021  Height:   Ht Readings from Last 1 Encounters:  03/09/21 5\' 2"  (1.575 m)    Weight:   Wt Readings from Last 1 Encounters:  03/13/21 70.2 kg    Ideal Body Weight:  50 kg  BMI:  Body mass index is 28.31 kg/m.  Estimated Nutritional Needs:   Kcal:  1750-1950  Protein:  85-100 grams  Fluid:  > 1.8  L    Luz Burcher Dannial Monarch, RD, LDN Clinical Dietitian See [facility] for contact information.

## 2021-03-14 ENCOUNTER — Encounter (HOSPITAL_COMMUNITY)
Admission: EM | Disposition: A | Discharge status: Skilled Nursing Facility | Payer: Self-pay | Source: Home / Self Care | Attending: Internal Medicine

## 2021-03-14 ENCOUNTER — Encounter (HOSPITAL_COMMUNITY): Payer: Self-pay | Admitting: Internal Medicine

## 2021-03-14 ENCOUNTER — Inpatient Hospital Stay (HOSPITAL_COMMUNITY): Payer: Medicare Other | Admitting: Anesthesiology

## 2021-03-14 DIAGNOSIS — K3189 Other diseases of stomach and duodenum: Secondary | ICD-10-CM

## 2021-03-14 HISTORY — PX: XI ROBOTIC ASSISTED HIATAL HERNIA REPAIR: SHX6889

## 2021-03-14 HISTORY — PX: PEG PLACEMENT: SHX5437

## 2021-03-14 LAB — CBC
HCT: 30.6 % — ABNORMAL LOW (ref 36.0–46.0)
Hemoglobin: 9.9 g/dL — ABNORMAL LOW (ref 12.0–15.0)
MCH: 31.1 pg (ref 26.0–34.0)
MCHC: 32.4 g/dL (ref 30.0–36.0)
MCV: 96.2 fL (ref 80.0–100.0)
Platelets: 142 10*3/uL — ABNORMAL LOW (ref 150–400)
RBC: 3.18 MIL/uL — ABNORMAL LOW (ref 3.87–5.11)
RDW: 15.4 % (ref 11.5–15.5)
WBC: 5.6 10*3/uL (ref 4.0–10.5)
nRBC: 0 % (ref 0.0–0.2)

## 2021-03-14 LAB — COMPREHENSIVE METABOLIC PANEL
ALT: 35 U/L (ref 0–44)
AST: 36 U/L (ref 15–41)
Albumin: 2.9 g/dL — ABNORMAL LOW (ref 3.5–5.0)
Alkaline Phosphatase: 65 U/L (ref 38–126)
Anion gap: 8 (ref 5–15)
BUN: <5 mg/dL — ABNORMAL LOW (ref 8–23)
CO2: 23 mmol/L (ref 22–32)
Calcium: 8.6 mg/dL — ABNORMAL LOW (ref 8.9–10.3)
Chloride: 107 mmol/L (ref 98–111)
Creatinine, Ser: 0.85 mg/dL (ref 0.44–1.00)
GFR, Estimated: >60 mL/min (ref >60)
Glucose, Bld: 87 mg/dL (ref 70–99)
Potassium: 3.5 mmol/L (ref 3.5–5.1)
Sodium: 138 mmol/L (ref 135–145)
Total Bilirubin: 0.8 mg/dL (ref 0.3–1.2)
Total Protein: 5.5 g/dL — ABNORMAL LOW (ref 6.5–8.1)

## 2021-03-14 LAB — HEPARIN LEVEL (UNFRACTIONATED): Heparin Unfractionated: 0.48 IU/mL (ref 0.30–0.70)

## 2021-03-14 LAB — APTT: aPTT: 76 seconds — ABNORMAL HIGH (ref 24–36)

## 2021-03-14 LAB — TYPE AND SCREEN
ABO/RH(D): O POS
Antibody Screen: NEGATIVE

## 2021-03-14 LAB — BRAIN NATRIURETIC PEPTIDE: B Natriuretic Peptide: 278.6 pg/mL — ABNORMAL HIGH (ref 0.0–100.0)

## 2021-03-14 SURGERY — REPAIR, HERNIA, HIATAL, ROBOT-ASSISTED
Anesthesia: General | Site: Abdomen

## 2021-03-14 MED ORDER — KCL IN DEXTROSE-NACL 10-5-0.45 MEQ/L-%-% IV SOLN
INTRAVENOUS | Status: AC
  Filled 2021-03-14: qty 1000

## 2021-03-14 MED ORDER — ORAL CARE MOUTH RINSE: 15.0000 mL | Freq: Once | OROMUCOSAL | Status: AC

## 2021-03-14 MED ORDER — FENTANYL CITRATE (PF) 100 MCG/2ML IJ SOLN
25.0000 ug | INTRAMUSCULAR | Status: DC | PRN
  Administered 2021-03-14 (×4): 25 ug via INTRAVENOUS

## 2021-03-14 MED ORDER — DEXAMETHASONE SODIUM PHOSPHATE 10 MG/ML IJ SOLN
INTRAMUSCULAR | Status: DC | PRN
  Administered 2021-03-14: 10 mg via INTRAVENOUS

## 2021-03-14 MED ORDER — FENTANYL CITRATE (PF) 250 MCG/5ML IJ SOLN
INTRAMUSCULAR | Status: AC
  Filled 2021-03-14: qty 5

## 2021-03-14 MED ORDER — CHLORHEXIDINE GLUCONATE 0.12 % MT SOLN: 15.0000 mL | Freq: Once | OROMUCOSAL | Status: AC

## 2021-03-14 MED ORDER — PHENYLEPHRINE HCL-NACL 20-0.9 MG/250ML-% IV SOLN
INTRAVENOUS | Status: DC | PRN
  Administered 2021-03-14: 25 ug/min via INTRAVENOUS

## 2021-03-14 MED ORDER — LACTATED RINGERS IV SOLN: INTRAVENOUS | Status: DC

## 2021-03-14 MED ORDER — BUPIVACAINE-EPINEPHRINE 0.25% -1:200000 IJ SOLN: INTRAMUSCULAR | Status: DC | PRN

## 2021-03-14 MED ORDER — ONDANSETRON HCL 4 MG/2ML IJ SOLN: 4.0000 mg | Freq: Once | INTRAMUSCULAR | Status: DC | PRN

## 2021-03-14 MED ORDER — BUPIVACAINE LIPOSOME 1.3 % IJ SUSP
INTRAMUSCULAR | Status: DC | PRN
  Administered 2021-03-14: 20 mL

## 2021-03-14 MED ORDER — ONDANSETRON HCL 4 MG/2ML IJ SOLN
INTRAMUSCULAR | Status: DC | PRN
  Administered 2021-03-14: 4 mg via INTRAVENOUS

## 2021-03-14 MED ORDER — SODIUM CHLORIDE 0.9% FLUSH
INTRAVENOUS | Status: DC | PRN
  Administered 2021-03-14: 20 mL

## 2021-03-14 MED ORDER — PROPOFOL 10 MG/ML IV BOLUS
INTRAVENOUS | Status: AC
  Filled 2021-03-14: qty 20

## 2021-03-14 MED ORDER — BUPIVACAINE LIPOSOME 1.3 % IJ SUSP
INTRAMUSCULAR | Status: AC
  Filled 2021-03-14: qty 20

## 2021-03-14 MED ORDER — ROCURONIUM 10MG/ML (10ML) SYRINGE FOR MEDFUSION PUMP - OPTIME
INTRAVENOUS | Status: DC | PRN
  Administered 2021-03-14: 100 mg via INTRAVENOUS

## 2021-03-14 MED ORDER — LACTATED RINGERS IV SOLN: INTRAVENOUS | Status: DC | PRN

## 2021-03-14 MED ORDER — SUGAMMADEX SODIUM 200 MG/2ML IV SOLN
INTRAVENOUS | Status: DC | PRN
  Administered 2021-03-14: 200 mg via INTRAVENOUS

## 2021-03-14 MED ORDER — 0.9 % SODIUM CHLORIDE (POUR BTL) OPTIME
TOPICAL | Status: DC | PRN
  Administered 2021-03-14: 1000 mL

## 2021-03-14 MED ORDER — FENTANYL CITRATE (PF) 100 MCG/2ML IJ SOLN
INTRAMUSCULAR | Status: AC
  Filled 2021-03-14: qty 2

## 2021-03-14 MED ORDER — HEPARIN (PORCINE) 25000 UT/250ML-% IV SOLN
1100.0000 [IU]/h | INTRAVENOUS | Status: DC
  Administered 2021-03-15: 10:00:00 850 [IU]/h via INTRAVENOUS
  Administered 2021-03-16 – 2021-03-17 (×2): 1000 [IU]/h via INTRAVENOUS
  Filled 2021-03-14 (×4): qty 250

## 2021-03-14 MED ORDER — SUCCINYLCHOLINE 20MG/ML (10ML) SYRINGE FOR MEDFUSION PUMP - OPTIME
INTRAMUSCULAR | Status: DC | PRN
  Administered 2021-03-14: 100 mg via INTRAVENOUS

## 2021-03-14 MED ORDER — BUPIVACAINE-EPINEPHRINE (PF) 0.25% -1:200000 IJ SOLN
INTRAMUSCULAR | Status: AC
  Filled 2021-03-14: qty 30

## 2021-03-14 MED ORDER — FENTANYL CITRATE (PF) 250 MCG/5ML IJ SOLN
INTRAMUSCULAR | Status: DC | PRN
  Administered 2021-03-14 (×3): 50 ug via INTRAVENOUS

## 2021-03-14 MED ORDER — PROPOFOL 10 MG/ML IV BOLUS
INTRAVENOUS | Status: DC | PRN
  Administered 2021-03-14: 130 mg via INTRAVENOUS

## 2021-03-14 MED ORDER — ACETAMINOPHEN 500 MG PO TABS
ORAL_TABLET | ORAL | Status: AC
  Administered 2021-03-14: 1000 mg via ORAL
  Filled 2021-03-14: qty 2

## 2021-03-14 MED ORDER — ACETAMINOPHEN 500 MG PO TABS: 1000.0000 mg | ORAL_TABLET | Freq: Once | ORAL | Status: AC

## 2021-03-14 MED ORDER — CHLORHEXIDINE GLUCONATE 0.12 % MT SOLN
OROMUCOSAL | Status: AC
  Administered 2021-03-14: 15 mL via OROMUCOSAL
  Filled 2021-03-14: qty 15

## 2021-03-14 MED ORDER — LIDOCAINE HCL (CARDIAC) PF 100 MG/5ML IV SOSY
PREFILLED_SYRINGE | INTRAVENOUS | Status: DC | PRN
  Administered 2021-03-14: 80 mg via INTRATRACHEAL

## 2021-03-14 SURGICAL SUPPLY — 65 items
BUTTON OLYMPUS DEFENDO 5 PIECE (MISCELLANEOUS) ×2 IMPLANT
CANNULA REDUC XI 12-8 STAPL (CANNULA) ×1
CANNULA REDUC XI 12-8MM STAPL (CANNULA) ×1
CANNULA REDUCER 12-8 DVNC XI (CANNULA) ×1 IMPLANT
CATH FOLEY 2WAY SLVR  5CC 14FR (CATHETERS) ×2
CATH FOLEY 2WAY SLVR 5CC 14FR (CATHETERS) IMPLANT
CHLORAPREP W/TINT 26 (MISCELLANEOUS) ×3 IMPLANT
COVER MAYO STAND STRL (DRAPES) ×3 IMPLANT
COVER SURGICAL LIGHT HANDLE (MISCELLANEOUS) ×3 IMPLANT
COVER TIP SHEARS 8 DVNC (MISCELLANEOUS) IMPLANT
COVER TIP SHEARS 8MM DA VINCI (MISCELLANEOUS)
DEFOGGER SCOPE WARMER CLEARIFY (MISCELLANEOUS) ×3 IMPLANT
DERMABOND ADVANCED (GAUZE/BANDAGES/DRESSINGS) ×2
DERMABOND ADVANCED .7 DNX12 (GAUZE/BANDAGES/DRESSINGS) ×1 IMPLANT
DEVICE TROCAR PUNCTURE CLOSURE (ENDOMECHANICALS) ×1 IMPLANT
DRAIN PENROSE 0.5X18 (DRAIN) ×2 IMPLANT
DRAPE ARM DVNC X/XI (DISPOSABLE) ×4 IMPLANT
DRAPE CARDIOVASC SPLIT 88X140 (DRAPES) ×3 IMPLANT
DRAPE COLUMN DVNC XI (DISPOSABLE) ×1 IMPLANT
DRAPE DA VINCI XI ARM (DISPOSABLE) ×8
DRAPE DA VINCI XI COLUMN (DISPOSABLE) ×2
DRAPE ORTHO SPLIT 77X108 STRL (DRAPES) ×2
DRAPE SURG ORHT 6 SPLT 77X108 (DRAPES) ×1 IMPLANT
ELECT REM PT RETURN 9FT ADLT (ELECTROSURGICAL) ×3
ELECTRODE REM PT RTRN 9FT ADLT (ELECTROSURGICAL) ×1 IMPLANT
GLOVE SURG ENC MOIS LTX SZ7.5 (GLOVE) ×2 IMPLANT
GLOVE SURG ENC MOIS LTX SZ8 (GLOVE) ×2 IMPLANT
GOWN STRL REUS W/ TWL LRG LVL3 (GOWN DISPOSABLE) ×1 IMPLANT
GOWN STRL REUS W/ TWL XL LVL3 (GOWN DISPOSABLE) ×2 IMPLANT
GOWN STRL REUS W/TWL 2XL LVL3 (GOWN DISPOSABLE) ×5 IMPLANT
GOWN STRL REUS W/TWL LRG LVL3 (GOWN DISPOSABLE) ×2
GOWN STRL REUS W/TWL XL LVL3 (GOWN DISPOSABLE)
KIT BASIN OR (CUSTOM PROCEDURE TRAY) ×3 IMPLANT
KIT TURNOVER KIT B (KITS) ×2 IMPLANT
MARKER SKIN DUAL TIP RULER LAB (MISCELLANEOUS) ×3 IMPLANT
NDL INSUFFLATION 14GA 120MM (NEEDLE) ×1 IMPLANT
NEEDLE 22X1 1/2 (OR ONLY) (NEEDLE) ×3 IMPLANT
NEEDLE INSUFFLATION 14GA 120MM (NEEDLE) IMPLANT
OBTURATOR OPTICAL STANDARD 8MM (TROCAR) ×2
OBTURATOR OPTICAL STND 8 DVNC (TROCAR) ×1
OBTURATOR OPTICALSTD 8 DVNC (TROCAR) IMPLANT
SCISSORS LAP 5X35 DISP (ENDOMECHANICALS) IMPLANT
SEAL CANN UNIV 5-8 DVNC XI (MISCELLANEOUS) ×3 IMPLANT
SEAL XI 5MM-8MM UNIVERSAL (MISCELLANEOUS) ×8
SEALER VESSEL DA VINCI XI (MISCELLANEOUS) ×2
SEALER VESSEL EXT DVNC XI (MISCELLANEOUS) ×1 IMPLANT
SET IRRIG TUBING LAPAROSCOPIC (IRRIGATION / IRRIGATOR) ×3 IMPLANT
SET TUBE SMOKE EVAC HIGH FLOW (TUBING) ×3 IMPLANT
STAPLER CANNULA SEAL DVNC XI (STAPLE) ×1 IMPLANT
STAPLER CANNULA SEAL XI (STAPLE) ×2
STOPCOCK 4 WAY LG BORE MALE ST (IV SETS) ×3 IMPLANT
SUT ETHIBOND 0 36 GRN (SUTURE) ×8 IMPLANT
SUT ETHIBOND 2 0 SH (SUTURE)
SUT ETHIBOND 2 0 SH 36X2 (SUTURE) IMPLANT
SUT ETHILON 2 0 FS 18 (SUTURE) ×2 IMPLANT
SUT MNCRL AB 4-0 PS2 18 (SUTURE) ×5 IMPLANT
SUT SILK 0 SH 30 (SUTURE) ×1 IMPLANT
SYR 30ML SLIP (SYRINGE) ×3 IMPLANT
TRAY FOLEY MTR SLVR 16FR STAT (SET/KITS/TRAYS/PACK) ×3 IMPLANT
TRAY LAPAROSCOPIC MC (CUSTOM PROCEDURE TRAY) ×3 IMPLANT
TROCAR ADV FIXATION 5X100MM (TROCAR) ×1 IMPLANT
TROCAR XCEL NON-BLD 11X100MML (ENDOMECHANICALS) ×2 IMPLANT
TROCAR XCEL NON-BLD 5MMX100MML (ENDOMECHANICALS) ×2 IMPLANT
TUBE ENDOVIVE SAFETY PEG 24 (TUBING) ×2 IMPLANT
TUBING ENDO SMARTCAP (MISCELLANEOUS) ×2 IMPLANT

## 2021-03-14 NOTE — Op Note (Signed)
Danielle Blanchard  05/24/1940   03/14/2021    PCP:  [facility]   Surgeon: Wenda Low, MD, FACS  Asst:  Kris Mouton, MD, FACS  Anes:  general  Preop Dx: Incarcerated hiatal hernia Postop Dx: Incarcerated type III hiatal hernia  Procedure: Robotic Xi takedown of stomach and sack from the mediastinum with endoscopic PEG placement by Dr. Bedelia Person Location Surgery: [facility] OR 10 Complications: None noted  EBL:    minimal cc  Drains: none  Description of Procedure:  The patient was taken to OR 10 .  After anesthesia was administered and the patient was prepped  with chloroprep  and a timeout was performed.  Access achieved with a 5 mm Optiview through the left upper quadrant.  Four robotic trocars were placed and medium Nathanson placed.  11 mm in the right lower quadrant.  Robot docked from the left and the medium Glorieta collided with arms so small Satira Mccallum was placed.  Fenestrated bipolar in the left lateral and then Vessel sealer and then the tip up in the lateral port on the right.    Patient was in 23 degrees head up.  Dissection was begun on the right.  Very attenuated right crus noted.  Left crural dissection was performed and the crural fibers were better.  The stomach was then mobilized by stripping the sack from the mediastinum.  When this had been completed the stomach lay in the abdomen.  Because the crura were so thin and stringy, I felt that G tube placement would work best.  Dr. Bedelia Person endoscoped the patient and we placed a PEG tube in the distal fundus.  This lay very well.  The abdomen was decompressed and the skin was closed with 4-0 Monocryl and Dermabond.  The G tube was affixed to the skin with nylon  The patient tolerated the procedure well and was taken to the PACU in stable condition.     [name], MD, [facility] [address], [place] [phone]

## 2021-03-14 NOTE — Anesthesia Preprocedure Evaluation (Addendum)
Anesthesia Evaluation  Patient identified by MRN, date of birth, ID band Patient awake    Reviewed: Allergy & Precautions, NPO status , Patient's Chart, lab work & pertinent test results, reviewed documented beta blocker date and time   Airway Mallampati: II  TM Distance: >3 FB Neck ROM: Full    Dental  (+) Dental Advisory Given, Edentulous Lower, Missing   Pulmonary neg pulmonary ROS,    Pulmonary exam normal breath sounds clear to auscultation       Cardiovascular hypertension, Pt. on medications and Pt. on home beta blockers +CHF  + dysrhythmias Atrial Fibrillation + pacemaker (Sick sinus syndrome s/p pacemaker) + Valvular Problems/Murmurs MR and AI  Rhythm:Regular Rate:Normal + Systolic murmurs TTE shows normal EF with mild MR and mild - moderate AR   Neuro/Psych negative neurological ROS  negative psych ROS   GI/Hepatic Neg liver ROS, hiatal hernia, GERD  Medicated,  Endo/Other  Hypothyroidism   Renal/GU negative Renal ROS     Musculoskeletal negative musculoskeletal ROS (+)   Abdominal   Peds  Hematology  (+) Blood dyscrasia (Eliquis; Thrombocytopenia), anemia ,   Anesthesia Other Findings   Reproductive/Obstetrics                            Anesthesia Physical Anesthesia Plan  ASA: 3  Anesthesia Plan: General   Post-op Pain Management: Tylenol PO (pre-op)   Induction: Intravenous, Rapid sequence and Cricoid pressure planned  PONV Risk Score and Plan: 3 and Dexamethasone and Ondansetron  Airway Management Planned: Oral ETT  Additional Equipment:   Intra-op Plan:   Post-operative Plan: Extubation in OR  Informed Consent: I have reviewed the patients History and Physical, chart, labs and discussed the procedure including the risks, benefits and alternatives for the proposed anesthesia with the patient or authorized representative who has indicated his/her understanding and  acceptance.     Dental advisory given  Plan Discussed with: CRNA  Anesthesia Plan Comments:         Anesthesia Quick Evaluation

## 2021-03-14 NOTE — Plan of Care (Signed)

## 2021-03-14 NOTE — Progress Notes (Signed)
Progress Note  Day of Surgery  Subjective: Pt resting this AM. Denies abdominal pain, n/v. Had a BM yesterday. Discussed surgical plans with patient and her son yesterday. Still agreeable to proceed this AM.   Objective: Vital signs in last 24 hours: Temp:  [98 F (36.7 C)-98.5 F (36.9 C)] 98.1 F (36.7 C) (12/13 0446) Pulse Rate:  [58-59] 58 (12/13 0446) Resp:  [17-19] 18 (12/13 0446) BP: (127-145)/(62-69) 132/64 (12/13 0446) SpO2:  [96 %-97 %] 96 % (12/13 0446) Weight:  [70.2 kg] 70.2 kg (12/12 1123) Last BM Date: 03/13/21  Intake/Output from previous day: 12/12 0701 - 12/13 0700 In: 695.5 [I.V.:695.5] Out: 400 [Urine:400] Intake/Output this shift: No intake/output data recorded.  PE: General: pleasant, WD, elderly female who is laying in bed in NAD Heart: regular, rate, and rhythm.   Lungs: CTAB, no wheezes, rhonchi, or rales noted.  Respiratory effort nonlabored Abd: soft, NT, ND, +BS, no masses, ventral hernias, or organomegaly Skin: warm and dry with no masses, lesions, or rashes   Lab Results:  Recent Labs    03/13/21 0053 03/14/21 0128  WBC 5.2 5.6  HGB 9.2* 9.9*  HCT 28.7* 30.6*  PLT 142* 142*   BMET Recent Labs    03/13/21 0053 03/14/21 0128  NA 140 138  K 4.1 3.5  CL 107 107  CO2 25 23  GLUCOSE 92 87  BUN 5* <5*  CREATININE 0.68 0.85  CALCIUM 8.8* 8.6*   PT/INR No results for input(s): LABPROT, INR in the last 72 hours. CMP     Component Value Date/Time   NA 138 03/14/2021 0128   K 3.5 03/14/2021 0128   CL 107 03/14/2021 0128   CO2 23 03/14/2021 0128   GLUCOSE 87 03/14/2021 0128   BUN <5 (L) 03/14/2021 0128   CREATININE 0.85 03/14/2021 0128   CALCIUM 8.6 (L) 03/14/2021 0128   PROT 5.5 (L) 03/14/2021 0128   ALBUMIN 2.9 (L) 03/14/2021 0128   AST 36 03/14/2021 0128   ALT 35 03/14/2021 0128   ALKPHOS 65 03/14/2021 0128   BILITOT 0.8 03/14/2021 0128   GFRNONAA >60 03/14/2021 0128   GFRAA >60 12/05/2019 2254   Lipase      Component Value Date/Time   LIPASE 42 02/11/2021 2017       Studies/Results: [place] EKG SITE RITE  Result Date: 03/13/2021 If Site Rite image not attached, placement could not be confirmed due to current cardiac rhythm.   Anti-infectives: Anti-infectives (From admission, onward)    Start     Dose/Rate Route Frequency Ordered Stop   03/14/21 0600  ceFAZolin (ANCEF) IVPB 2g/100 mL premix        2 g 200 mL/hr over 30 Minutes Intravenous On call to O.R. 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 Daphine Deutscher around 10 AM   FEN: NPO, IVF per [facility] VTE: heparin gtt on hold for OR this AM ID: no current abx   Sick sinus syndrome s/p pacemaker Paroxysmal A. Fib on eliquis - eliquis on hold Chronic diastolic CHF - EF is 60-65% on echo 12/9 HTN HLD GERD  LOS: 4 days    Juliet Rude, [facility] 03/14/2021, 8:14 AM Please see Amion for pager number during day hours 7:00am-4:30pm

## 2021-03-14 NOTE — Interval H&P Note (Signed)
History and Physical Interval Note:  03/14/2021 9:59 AM  Danielle Blanchard  has presented today for surgery, with the diagnosis of Hiatal Hernia.  The various methods of treatment have been discussed with the patient and family. After consideration of risks, benefits and other options for treatment, the patient has consented to  Procedure(s): XI ROBOTIC ASSISTED HIATAL HERNIA REPAIR (N/A) as a surgical intervention.  The patient's history has been reviewed, patient examined, no change in status, stable for surgery.  I have reviewed the patient's chart and labs.  Questions were answered to the patient's satisfaction.     [name]

## 2021-03-14 NOTE — Anesthesia Postprocedure Evaluation (Signed)
Anesthesia Post Note  Patient: Meagan Ancona  Procedure(s) Performed: XI ROBOTIC ASSISTED TAKEDOWN OF INCARCERATED HIATAL HERNIA (Abdomen) PERCUTANEOUS ENDOSCOPIC GASTROSTOMY (PEG) PLACEMENT (Abdomen)     Patient location during evaluation: PACU Anesthesia Type: General Level of consciousness: awake and alert Pain management: pain level controlled Vital Signs Assessment: post-procedure vital signs reviewed and stable Respiratory status: spontaneous breathing, nonlabored ventilation and respiratory function stable Cardiovascular status: blood pressure returned to baseline and stable Postop Assessment: no apparent nausea or vomiting Anesthetic complications: no   No notable events documented.  Last Vitals:  Vitals:   03/14/21 1350 03/14/21 1405  BP: (!) 147/75 (!) 164/82  Pulse: 60 60  Resp: 16 13  Temp:  (!) 36.2 C  SpO2: 94% 95%    Last Pain:  Vitals:   03/14/21 1405  TempSrc:   PainSc: 3                  Cecile Hearing

## 2021-03-14 NOTE — Progress Notes (Signed)
PROGRESS NOTE                                                                                                                                                                                                             Patient Demographics:    Danielle Blanchard, is a 80 y.o. female, DOB - [birth year], [id number]  Outpatient Primary MD for the patient is [facility] [place]    LOS - 4  Admit date - 03/09/2021    Chief Complaint  Patient presents with   Chest Pain   Abdominal Pain       Brief Narrative (HPI from H&P)  Danielle Blanchard is a 80 y.o. 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   Subjective:   Patient in bed, appears comfortable, denies any headache, no fever, no chest pain or pressure, no shortness of breath , no abdominal pain. No focal weakness.   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  She has been treated conservatively with bowel rest and IV fluids, her symptoms are much better and abdominal exam now is benign, she is passing some flatus and known nausea at this time.  Plan is n.p.o. except medications, IV fluids, neurosurgery following going to the OR for laparoscopic surgery and hernia reduction on 03/14/2021.  Discussed with general surgery team on 03/14/2021, likely okay to taking  sips after surgery today and clear diet from tomorrow.  Of note patient will be a moderate to high risk for adverse cardiopulmonary outcome during the perioperative period, this is due to her advanced age and underlying medical issues, risks and benefits were discussed with patient and patient's son who accepts the risk and want to proceed for surgical intervention.   2.  Paroxysmal atrial fibrillation.  Mali vas 2 score of greater than  4.  Heparin drip instead of Eliquis, continue Coreg and amiodarone orally with a sip of water, repeat echocardiogram stable.  Appreciate cardiology input  3.  Hypertension.  Continue Coreg along with as needed IV Lopressor and hydralazine.  4.  History of sick sinus syndrome.  S/p pacemaker placement.  5.  Chronic diastolic CHF EF [id number] last echocardiogram at [facility].  Repeat echo remains stable.    6. Severe hypokalemia.  Replaced IV and stable, continue to monitor.         Condition - Guarded  Family Communication  :  son [name] [phone] - on 03/10/21, 03/11/21  Code Status :  Full  Consults  :  CCS, Cards  PUD Prophylaxis : PPI   Procedures  :     TTE - 1. Left ventricular ejection fraction, by estimation, is 60 to 65%. The left ventricle has normal function. The left ventricle has no regional wall motion abnormalities. Left ventricular diastolic parameters are consistent with Grade II diastolic dysfunction (pseudonormalization).  2. Right ventricular systolic function is normal. The right ventricular size is normal.  3. Left atrial size was moderate to severe.  4. Right atrial size was moderately dilated.  5. The mitral valve is grossly normal. Mild mitral valve regurgitation.  6. Focal calcification. P1/2 T 501 ms. The aortic valve is calcified. Aortic valve regurgitation is mild to moderate.  7. The inferior vena cava is normal in size with greater than 50% respiratory variability, suggesting right atrial pressure of 3 mmHg. Comparison(s): No prior  Echocardiogram  CT - 1. Large hiatal hernia with the gastric body and antrum intrathoracic in location and air-fluid levels in the stomach, unchanged from the previous exam. Findings are concerning for gastric volvulus or early obstruction. Surgical consultation is recommended. 2. Coronary artery calcifications. 3. Stable compression deformity in the inferior endplate of 624THL.       Disposition Plan  :    Status is: Inpatient  Remains inpatient appropriate because: GI Volvulus  DVT Prophylaxis  :    SCDs Start: 03/10/21 0229    Lab Results  Component Value Date   PLT 142 (L) 03/14/2021    Diet :  Diet Order             Diet NPO time specified Except for: Sips with Meds  Diet effective midnight                    Inpatient Medications  Scheduled Meds:  [MAR Hold] amiodarone  200 mg Oral Daily   [MAR Hold] carvedilol  3.125 mg Oral BID WC   [MAR Hold] Chlorhexidine Gluconate Cloth  6 each Topical Daily   [MAR Hold] docusate sodium  100 mg Oral BID   [MAR Hold] levothyroxine  75 mcg Oral Q0600   [MAR Hold] melatonin  3 mg Oral QHS   [MAR Hold] mupirocin ointment  1 application Nasal BID   [MAR Hold] pantoprazole (PROTONIX) IV  40 mg Intravenous Q24H   [MAR Hold] rosuvastatin  20 mg Oral Daily   Continuous Infusions:  [MAR Hold]  ceFAZolin (ANCEF) IV     dextrose 5 % and 0.45 % NaCl with KCl 10 mEq/L     lactated ringers 10 mL/hr at 03/14/21 0929   [MAR Hold] promethazine (PHENERGAN) injection (IM or IVPB)     PRN Meds:.[MAR Hold] hydrALAZINE, [MAR Hold] metoprolol tartrate, [MAR Hold]  morphine injection, [MAR Hold] promethazine (PHENERGAN) injection (IM or IVPB)  Antibiotics  :  Anti-infectives (From admission, onward)    Start     Dose/Rate Route Frequency Ordered Stop   03/14/21 0600  [MAR Hold]  ceFAZolin (ANCEF) IVPB 2g/100 mL premix        (MAR Hold since Tue 03/14/2021 at 0917.Hold Reason: Transfer to a Procedural area)   2 g 200 mL/hr over 30  Minutes Intravenous On call to O.R. 03/13/21 1210 03/15/21 0559        Time Spent in minutes  30   Susa Raring M.D on 03/14/2021 at 10:16 AM  To page go to [email or web address]   [facility] -  Office  [phone]  See all Orders from today for further details    Objective:   Vitals:   03/13/21 1602 03/13/21 2024 03/14/21 0446 03/14/21 0818  BP: 127/69 (!) 145/62 132/64 (!) 143/61  Pulse: (!) 59 (!) 58 (!) 58 (!) 59  Resp: 19 17 18 18   Temp: 98.5 F (36.9 C) 98 F (36.7 C) 98.1 F (36.7 C) 98.5 F (36.9 C)  TempSrc:    Oral  SpO2: 97% 96% 96% 98%  Weight:      Height:        Wt Readings from Last 3 Encounters:  03/13/21 70.2 kg  02/13/21 71.8 kg  07/22/19 65.8 kg     Intake/Output Summary (Last 24 hours) at 03/14/2021 1016 Last data filed at 03/13/2021 2200 Gross per 24 hour  Intake 695.45 ml  Output 400 ml  Net 295.45 ml     Physical Exam  Awake Alert, No new F.N deficits, Normal affect Kiana.AT,[name] Supple Neck, No JVD,   Symmetrical Chest wall movement, Good air movement bilaterally, CTAB RRR,No Gallops, Rubs or new Murmurs,  +ve B.Sounds, Abd Soft, No tenderness,   No Cyanosis, Clubbing or edema      Data Review:    CBC Recent Labs  Lab 03/09/21 2223 03/11/21 0044 03/12/21 0053 03/13/21 0053 03/14/21 0128  WBC 5.9 4.5 5.8 5.2 5.6  HGB 10.4* 9.5* 10.4* 9.2* 9.9*  HCT 31.9* 29.7* 30.9* 28.7* 30.6*  PLT 189 146* 138* 142* 142*  MCV 96.1 96.1 93.6 96.0 96.2  MCH 31.3 30.7 31.5 30.8 31.1  MCHC 32.6 32.0 33.7 32.1 32.4  RDW 15.3 15.6* 15.2 15.4 15.4  LYMPHSABS 1.2  --   --   --   --   MONOABS 0.6  --   --   --   --   EOSABS 0.2  --   --   --   --   BASOSABS 0.1  --   --   --   --     Electrolytes Recent Labs  Lab 03/09/21 2223 03/10/21 0401 03/11/21 0044 03/12/21 0053 03/13/21 0053 03/14/21 0128  NA 135 137 139 137 140 138  K 3.1* 2.8* 4.2 3.5 4.1 3.5  CL 97* 95* 103 101 107 107  CO2 30 31 29 25 25 23   GLUCOSE 137*  124* 78 85 92 87  BUN 19 19 8  7* 5* <5*  CREATININE 0.98 0.96 0.74 0.75 0.68 0.85  CALCIUM 9.1 8.9 8.7* 8.7* 8.8* 8.6*  AST 50*  --  35 38 35 36  ALT 52*  --  38 41 36 35  ALKPHOS 72  --  63 68 65 65  BILITOT 0.8  --  0.5 0.6 0.7 0.8  ALBUMIN 3.5  --  2.8* 3.2* 3.0* 2.9*  MG  --  2.1 1.9 1.8 2.0  --   BNP  --   --  326.4* 383.8* 503.4* 278.6*    ------------------------------------------------------------------------------------------------------------------ No results for input(s): CHOL, HDL, LDLCALC, TRIG, CHOLHDL, LDLDIRECT in the last 72 hours.  No results found for: HGBA1C  No results for input(s): TSH, T4TOTAL, T3FREE, THYROIDAB in the last 72 hours.  Invalid input(s): FREET3 ------------------------------------------------------------------------------------------------------------------ ID Labs Recent Labs  Lab 03/09/21 2223 03/10/21 0401 03/11/21 0044 03/12/21 0053 03/13/21 0053 03/14/21 0128  WBC 5.9  --  4.5 5.8 5.2 5.6  PLT 189  --  146* 138* 142* 142*  CREATININE 0.98 0.96 0.74 0.75 0.68 0.85   Cardiac Enzymes No results for input(s): CKMB, TROPONINI, MYOGLOBIN in the last 168 hours.  Invalid input(s): CK   Radiology Reports ECHOCARDIOGRAM COMPLETE  Result Date: 03/10/2021    ECHOCARDIOGRAM REPORT   Patient Name:   Danielle Blanchard Date of Exam: 03/10/2021 Medical Rec #:  [id number]   Height:       62.0 in Accession #:    [id number]  Weight:       158.3 lb Date of Birth:  06-18-1940   BSA:          1.731 m Patient Age:    28 years    BP:           118/58 mmHg Patient Gender: F           HR:           60 bpm. Exam Location:  Inpatient Procedure: Cardiac Doppler, Color Doppler, 3D Echo and 2D Echo STAT ECHO Indications:    CHF  History:        Patient has no prior history of Echocardiogram examinations.                 CHF; Arrythmias:Atrial Fibrillation.  Sonographer:    Glo Herring Referring Phys: [place]  1. Left ventricular ejection  fraction, by estimation, is 60 to 65%. The left ventricle has normal function. The left ventricle has no regional wall motion abnormalities. Left ventricular diastolic parameters are consistent with Grade II diastolic dysfunction (pseudonormalization).  2. Right ventricular systolic function is normal. The right ventricular size is normal.  3. Left atrial size was moderate to severe.  4. Right atrial size was moderately dilated.  5. The mitral valve is grossly normal. Mild mitral valve regurgitation.  6. Focal calcification. P1/2 T 501 ms. The aortic valve is calcified. Aortic valve regurgitation is mild to moderate.  7. The inferior vena cava is normal in size with greater than 50% respiratory variability, suggesting right atrial pressure of 3 mmHg. Comparison(s): No prior Echocardiogram. FINDINGS  Left Ventricle: Left ventricular ejection fraction, by estimation, is 60 to 65%. The left ventricle has normal function. The left ventricle has no regional wall motion abnormalities. The left ventricular internal cavity size was normal in size. There is  no left ventricular hypertrophy. Left ventricular diastolic parameters are consistent with Grade II diastolic dysfunction (pseudonormalization). Right Ventricle: The right ventricular size is normal. No increase in right ventricular wall thickness. Right ventricular systolic function is normal. Left Atrium: Left atrial size was moderate to severe. Right Atrium: Right atrial size was moderately dilated. Pericardium: There is no evidence of pericardial effusion. Mitral Valve: The mitral valve is grossly normal. Mild mitral annular calcification. Mild mitral valve regurgitation. MV peak gradient, 7.2 mmHg. The mean mitral valve gradient is 2.5 mmHg. Tricuspid Valve: The tricuspid valve is grossly normal. Tricuspid valve regurgitation is mild. Aortic Valve: Focal calcification. P1/2 T 501 ms. The aortic valve is  calcified. Aortic valve regurgitation is mild to moderate.  Aortic regurgitation PHT measures 501 msec. Aortic valve mean gradient measures 4.0 mmHg. Aortic valve peak gradient measures  6.7 mmHg. Aortic valve area, by VTI measures 1.89 cm. Pulmonic Valve: The pulmonic valve was grossly normal. Pulmonic valve regurgitation is mild. Aorta: The aortic root and ascending aorta are structurally normal, with no evidence of dilitation. Venous: The inferior vena cava is normal in size with greater than 50% respiratory variability, suggesting right atrial pressure of 3 mmHg. IAS/Shunts: No atrial level shunt detected by color flow Doppler. Additional Comments: A device lead is visualized.  LEFT VENTRICLE PLAX 2D LVIDd:         5.00 cm     Diastology LVIDs:         3.20 cm     LV e' medial:    6.62 cm/s LV PW:         0.80 cm     LV E/e' medial:  16.5 LV IVS:        0.90 cm     LV e' lateral:   7.37 cm/s LVOT diam:     2.00 cm     LV E/e' lateral: 14.8 LV SV:         63 LV SV Index:   36 LVOT Area:     3.14 cm  LV Volumes (MOD) LV vol d, MOD A2C: 77.8 ml LV vol d, MOD A4C: 95.6 ml LV vol s, MOD A2C: 32.5 ml LV vol s, MOD A4C: 35.4 ml LV SV MOD A2C:     45.3 ml LV SV MOD A4C:     95.6 ml LV SV MOD BP:      52.9 ml RIGHT VENTRICLE             IVC RV Basal diam:  3.70 cm     IVC diam: 1.70 cm RV S prime:     12.80 cm/s LEFT ATRIUM             Index        RIGHT ATRIUM           Index LA diam:        3.90 cm 2.25 cm/m   RA Area:     18.40 cm LA Vol (A2C):   89.2 ml 51.54 ml/m  RA Volume:   45.40 ml  26.23 ml/m LA Vol (A4C):   67.8 ml 39.17 ml/m LA Biplane Vol: 80.4 ml 46.45 ml/m  AORTIC VALVE                    PULMONIC VALVE AV Area (Vmax):    1.88 cm     PV Vmax:       0.74 m/s AV Area (Vmean):   1.74 cm     PV Peak grad:  2.2 mmHg AV Area (VTI):     1.89 cm AV Vmax:           129.00 cm/s AV Vmean:          90.600 cm/s AV VTI:            0.331 m AV Peak Grad:      6.7 mmHg AV Mean Grad:      4.0 mmHg LVOT Vmax:         77.00 cm/s LVOT Vmean:        50.200 cm/s LVOT VTI:           0.199 m LVOT/AV VTI  ratio: 0.60 AI PHT:            501 msec  AORTA Ao Root diam: 3.20 cm Ao Asc diam:  3.60 cm MITRAL VALVE                TRICUSPID VALVE MV Area (PHT): 3.36 cm     TR Peak grad:   26.2 mmHg MV Area VTI:   1.52 cm     TR Vmax:        256.00 cm/s MV Peak grad:  7.2 mmHg MV Mean grad:  2.5 mmHg     SHUNTS MV Vmax:       1.34 m/s     Systemic VTI:  0.20 m MV Vmean:      73.4 cm/s    Systemic Diam: 2.00 cm MV Decel Time: 226 msec MR PISA:        2.26 cm MR PISA Radius: 0.60 cm MV E velocity: 109.00 cm/s MV A velocity: 60.40 cm/s MV E/A ratio:  1.80 Landscape architect signed by Phineas Inches Signature Date/Time: 03/10/2021/12:53:06 PM    Final    [place] EKG SITE RITE  Result Date: 03/13/2021 If Site Rite image not attached, placement could not be confirmed due to current cardiac rhythm.

## 2021-03-14 NOTE — Anesthesia Procedure Notes (Signed)
Procedure Name: Intubation Date/Time: 03/14/2021 10:12 AM Performed by: Claris Che, CRNA Pre-anesthesia Checklist: Patient identified, Emergency Drugs available, Suction available, Patient being monitored and Timeout performed Patient Re-evaluated:Patient Re-evaluated prior to induction Oxygen Delivery Method: Circle system utilized Preoxygenation: Pre-oxygenation with 100% oxygen Induction Type: IV induction, Rapid sequence and Cricoid Pressure applied Laryngoscope Size: Mac and 4 Grade View: Grade I Tube type: Oral Tube size: 7.5 mm Number of attempts: 1 Airway Equipment and Method: Stylet Placement Confirmation: ETT inserted through vocal cords under direct vision, positive ETCO2 and breath sounds checked- equal and bilateral Secured at: 22 cm Tube secured with: Tape Dental Injury: Teeth and Oropharynx as per pre-operative assessment

## 2021-03-14 NOTE — Progress Notes (Signed)
°  Transition of Care [facility]) Screening Note   Patient Details  Name: Danielle Blanchard Date of Birth: Aug 09, 1940   Transition of Care [facility]) CM/SW Contact:    Kermit Balo, RN Phone Number: 03/14/2021, 3:58 PM    Transition of Care Department [facility]) has reviewed patient. We will continue to monitor patient advancement through interdisciplinary progression rounds. If new patient transition needs arise, please place a TOC consult.

## 2021-03-14 NOTE — Progress Notes (Signed)
ANTICOAGULATION CONSULT NOTE  Pharmacy Consult for heparin Indication: atrial fibrillation  No Known Allergies  Patient Measurements: Height: 5\' 2"  (157.5 cm) Weight: 70.2 kg (154 lb 12.8 oz) IBW/kg (Calculated) : 50.1 Heparin Dosing Weight: 65.4 kg  Vital Signs: Temp: 97.1 F (36.2 C) (12/13 1405) Temp Source: Oral (12/13 0818) BP: 164/82 (12/13 1405) Pulse Rate: 60 (12/13 1405)  Labs: Recent Labs    03/12/21 0053 03/12/21 0345 03/12/21 1445 03/13/21 0053 03/14/21 0128  HGB 10.4*  --   --  9.2* 9.9*  HCT 30.9*  --   --  28.7* 30.6*  PLT 138*  --   --  142* 142*  APTT >200*   < > 79* 89* 76*  HEPARINUNFRC >1.10*  --   --  0.76* 0.48  CREATININE 0.75  --   --  0.68 0.85   < > = values in this interval not displayed.     Estimated Creatinine Clearance: 46.8 mL/min (by C-G formula based on SCr of 0.85 mg/dL).   Assessment:  80 yo F with pAF (PTA eliquis), CHF, HTN, HLD and GERD. Pt with N/V bc of recurrent gastric volvulus with partial obstruction. GI and surgery following with plans for surgical reduction and repair of hernia. Holding PTA eliquis, last dose 12/8 AM - heparin gtt in the meantime.    12/13 heparin held for hernia repair. Per team, ok to restart 12/14 at 10 am, no bolus.   Goal of Therapy:  Heparin level 0.3-0.7 units/ml aPTT 66-102 seconds Monitor platelets by anticoagulation protocol: Yes   Plan:  Restart heparin at 850 units/hr 12/14 at 10am, no bolus F/u aPTT until correlates with heparin level  F/u 8hr aPTT/HL Monitor daily aPTT HL, CBC/plt Monitor for signs/symptoms of bleeding  F/u apixaban restart   1/15, PharmD, BCPS, [facility] Clinical Pharmacist  Please check AMION for all [facility] Pharmacy phone numbers After 10:00 PM, call Main Pharmacy [phone]

## 2021-03-14 NOTE — Progress Notes (Signed)
PT Cancellation Note  Patient Details Name: Danielle Blanchard MRN: [id number] DOB: 1940/04/14   Cancelled Treatment:    Reason Eval/Treat Not Completed: Patient at procedure or test/unavailable. Pt at OR for hernia repair. PT to return as able, as appropriate to re-assess mobility s/p surgery.  Lewis Shock, PT, DPT Acute Rehabilitation Services Pager #: [phone] Office #: [phone]    Iona Hansen 03/14/2021, 12:39 PM

## 2021-03-14 NOTE — H&P (View-Only) (Signed)
Progress Note  Day of Surgery  Subjective: Pt resting this AM. Denies abdominal pain, n/v. Had a BM yesterday. Discussed surgical plans with patient and her son yesterday. Still agreeable to proceed this AM.   Objective: Vital signs in last 24 hours: Temp:  [98 F (36.7 C)-98.5 F (36.9 C)] 98.1 F (36.7 C) (12/13 0446) Pulse Rate:  [58-59] 58 (12/13 0446) Resp:  [17-19] 18 (12/13 0446) BP: (127-145)/(62-69) 132/64 (12/13 0446) SpO2:  [96 %-97 %] 96 % (12/13 0446) Weight:  [70.2 kg] 70.2 kg (12/12 1123) Last BM Date: 03/13/21  Intake/Output from previous day: 12/12 0701 - 12/13 0700 In: 695.5 [I.V.:695.5] Out: 400 [Urine:400] Intake/Output this shift: No intake/output data recorded.  PE: General: pleasant, WD, elderly female who is laying in bed in NAD Heart: regular, rate, and rhythm.   Lungs: CTAB, no wheezes, rhonchi, or rales noted.  Respiratory effort nonlabored Abd: soft, NT, ND, +BS, no masses, ventral hernias, or organomegaly Skin: warm and dry with no masses, lesions, or rashes   Lab Results:  Recent Labs    03/13/21 0053 03/14/21 0128  WBC 5.2 5.6  HGB 9.2* 9.9*  HCT 28.7* 30.6*  PLT 142* 142*   BMET Recent Labs    03/13/21 0053 03/14/21 0128  NA 140 138  K 4.1 3.5  CL 107 107  CO2 25 23  GLUCOSE 92 87  BUN 5* <5*  CREATININE 0.68 0.85  CALCIUM 8.8* 8.6*   PT/INR No results for input(s): LABPROT, INR in the last 72 hours. CMP     Component Value Date/Time   NA 138 03/14/2021 0128   K 3.5 03/14/2021 0128   CL 107 03/14/2021 0128   CO2 23 03/14/2021 0128   GLUCOSE 87 03/14/2021 0128   BUN <5 (L) 03/14/2021 0128   CREATININE 0.85 03/14/2021 0128   CALCIUM 8.6 (L) 03/14/2021 0128   PROT 5.5 (L) 03/14/2021 0128   ALBUMIN 2.9 (L) 03/14/2021 0128   AST 36 03/14/2021 0128   ALT 35 03/14/2021 0128   ALKPHOS 65 03/14/2021 0128   BILITOT 0.8 03/14/2021 0128   GFRNONAA >60 03/14/2021 0128   GFRAA >60 12/05/2019 2254   Lipase      Component Value Date/Time   LIPASE 42 02/11/2021 2017       Studies/Results: US EKG SITE RITE  Result Date: 03/13/2021 If Site Rite image not attached, placement could not be confirmed due to current cardiac rhythm.   Anti-infectives: Anti-infectives (From admission, onward)    Start     Dose/Rate Route Frequency Ordered Stop   03/14/21 0600  ceFAZolin (ANCEF) IVPB 2g/100 mL premix        2 g 200 mL/hr over 30 Minutes Intravenous On call to O.R. 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 Martin around 10 AM   FEN: NPO, IVF per TRH VTE: heparin gtt on hold for OR this AM ID: no current abx   Sick sinus syndrome s/p pacemaker Paroxysmal A. Fib on eliquis - eliquis on hold Chronic diastolic CHF - EF is 60-65% on echo 12/9 HTN HLD GERD  LOS: 4 days    Chandni Gagan R Elah Avellino, PA-C [facility] 03/14/2021, 8:14 AM Please see Amion for pager number during day hours 7:00am-4:30pm  

## 2021-03-14 NOTE — Transfer of Care (Signed)
Immediate Anesthesia Transfer of Care Note  Patient: Danielle Blanchard  Procedure(s) Performed: XI ROBOTIC ASSISTED TAKEDOWN OF INCARCERATED HIATAL HERNIA (Abdomen) PERCUTANEOUS ENDOSCOPIC GASTROSTOMY (PEG) PLACEMENT (Abdomen)  Patient Location: PACU  Anesthesia Type:General  Level of Consciousness: drowsy, patient cooperative and responds to stimulation  Airway & Oxygen Therapy: Patient Spontanous Breathing and Patient connected to nasal cannula oxygen  Post-op Assessment: Report given to RN, Post -op Vital signs reviewed and stable and Patient moving all extremities X 4  Post vital signs: Reviewed and stable  Last Vitals:  Vitals Value Taken Time  BP 147/71 03/14/21 1305  Temp    Pulse 60 03/14/21 1308  Resp 16 03/14/21 1308  SpO2 96 % 03/14/21 1308  Vitals shown include unvalidated device data.  Last Pain:  Vitals:   03/14/21 0926  TempSrc:   PainSc: 0-No pain      Patients Stated Pain Goal: 0 (03/14/21 0100)  Complications: No notable events documented.

## 2021-03-15 ENCOUNTER — Encounter (HOSPITAL_COMMUNITY): Payer: Self-pay | Admitting: Surgery

## 2021-03-15 LAB — CBC WITH DIFFERENTIAL/PLATELET
Abs Immature Granulocytes: 0.13 10*3/uL — ABNORMAL HIGH (ref 0.00–0.07)
Basophils Absolute: 0 10*3/uL (ref 0.0–0.1)
Basophils Relative: 0 %
Eosinophils Absolute: 0 10*3/uL (ref 0.0–0.5)
Eosinophils Relative: 0 %
HCT: 33.9 % — ABNORMAL LOW (ref 36.0–46.0)
Hemoglobin: 11.1 g/dL — ABNORMAL LOW (ref 12.0–15.0)
Immature Granulocytes: 1 %
Lymphocytes Relative: 4 %
Lymphs Abs: 0.4 10*3/uL — ABNORMAL LOW (ref 0.7–4.0)
MCH: 31.4 pg (ref 26.0–34.0)
MCHC: 32.7 g/dL (ref 30.0–36.0)
MCV: 95.8 fL (ref 80.0–100.0)
Monocytes Absolute: 0.3 10*3/uL (ref 0.1–1.0)
Monocytes Relative: 3 %
Neutro Abs: 10 10*3/uL — ABNORMAL HIGH (ref 1.7–7.7)
Neutrophils Relative %: 92 %
Platelets: 168 10*3/uL (ref 150–400)
RBC: 3.54 MIL/uL — ABNORMAL LOW (ref 3.87–5.11)
RDW: 15.6 % — ABNORMAL HIGH (ref 11.5–15.5)
WBC: 10.9 10*3/uL — ABNORMAL HIGH (ref 4.0–10.5)
nRBC: 0 % (ref 0.0–0.2)

## 2021-03-15 LAB — COMPREHENSIVE METABOLIC PANEL
ALT: 45 U/L — ABNORMAL HIGH (ref 0–44)
AST: 56 U/L — ABNORMAL HIGH (ref 15–41)
Albumin: 3 g/dL — ABNORMAL LOW (ref 3.5–5.0)
Alkaline Phosphatase: 91 U/L (ref 38–126)
Anion gap: 9 (ref 5–15)
BUN: 10 mg/dL (ref 8–23)
CO2: 22 mmol/L (ref 22–32)
Calcium: 8.6 mg/dL — ABNORMAL LOW (ref 8.9–10.3)
Chloride: 104 mmol/L (ref 98–111)
Creatinine, Ser: 0.85 mg/dL (ref 0.44–1.00)
GFR, Estimated: >60 mL/min (ref >60)
Glucose, Bld: 161 mg/dL — ABNORMAL HIGH (ref 70–99)
Potassium: 4.1 mmol/L (ref 3.5–5.1)
Sodium: 135 mmol/L (ref 135–145)
Total Bilirubin: 1.1 mg/dL (ref 0.3–1.2)
Total Protein: 6.3 g/dL — ABNORMAL LOW (ref 6.5–8.1)

## 2021-03-15 LAB — HEPARIN LEVEL (UNFRACTIONATED): Heparin Unfractionated: 0.13 IU/mL — ABNORMAL LOW (ref 0.30–0.70)

## 2021-03-15 LAB — LACTIC ACID, PLASMA: Lactic Acid, Venous: 0.9 mmol/L (ref 0.5–1.9)

## 2021-03-15 LAB — MAGNESIUM: Magnesium: 1.9 mg/dL (ref 1.7–2.4)

## 2021-03-15 LAB — PHOSPHORUS: Phosphorus: 4.5 mg/dL (ref 2.5–4.6)

## 2021-03-15 LAB — APTT: aPTT: 36 seconds (ref 24–36)

## 2021-03-15 MED ORDER — DEXTROSE 5 % IV SOLN
500.0000 mg | Freq: Once | INTRAVENOUS | Status: AC
  Administered 2021-03-15: 05:00:00 500 mg via INTRAVENOUS
  Filled 2021-03-15: qty 500

## 2021-03-15 MED ORDER — HYDROMORPHONE HCL 1 MG/ML IJ SOLN
0.5000 mg | INTRAMUSCULAR | Status: DC | PRN
  Administered 2021-03-15 – 2021-03-16 (×5): 0.5 mg via INTRAVENOUS
  Filled 2021-03-15 (×6): qty 0.5

## 2021-03-15 MED ORDER — SODIUM CHLORIDE 0.9 % IV BOLUS
500.0000 mL | Freq: Once | INTRAVENOUS | Status: AC
  Administered 2021-03-15: 06:00:00 500 mL via INTRAVENOUS

## 2021-03-15 MED ORDER — ALUM & MAG HYDROXIDE-SIMETH 200-200-20 MG/5ML PO SUSP
30.0000 mL | ORAL | Status: DC | PRN
  Administered 2021-03-15 – 2021-03-20 (×3): 30 mL via ORAL
  Filled 2021-03-15 (×3): qty 30

## 2021-03-15 NOTE — Progress Notes (Signed)
° ° °  Subjective:  Post robotic volvulus /hernia repair   Objective:  Vitals:   03/15/21 0014 03/15/21 0435 03/15/21 0741 03/15/21 0745  BP: (!) 135/54 (!) 146/65 (!) 155/65 (!) 155/65  Pulse: 60 62 (!) 59 (!) 59  Resp: 18 18  18   Temp: 97.8 F (36.6 C) 98.7 F (37.1 C)  (!) 97.3 F (36.3 C)  TempSrc: Oral Oral  Oral  SpO2: 95% 93%  96%  Weight:      Height:        Intake/Output from previous day:  Intake/Output Summary (Last 24 hours) at 03/15/2021 0900 Last data filed at 03/15/2021 0817 Gross per 24 hour  Intake 2166.28 ml  Output 490 ml  Net 1676.28 ml    Physical Exam: Sitting in chair comfortable Lungs clear PPM under left clavicle SEM/AR murmur  Post surgery with gastric PEG tube  Trace edema Palpable pedal pulses   Lab Results: Basic Metabolic Panel: Recent Labs    03/13/21 0053 03/14/21 0128 03/15/21 0422  NA 140 138 135  K 4.1 3.5 4.1  CL 107 107 104  CO2 25 23 22   GLUCOSE 92 87 161*  BUN 5* <5* 10  CREATININE 0.68 0.85 0.85  CALCIUM 8.8* 8.6* 8.6*  MG 2.0  --  1.9  PHOS 3.2  --  4.5   Liver Function Tests: Recent Labs    03/14/21 0128 03/15/21 0422  AST 36 56*  ALT 35 45*  ALKPHOS 65 91  BILITOT 0.8 1.1  PROT 5.5* 6.3*  ALBUMIN 2.9* 3.0*   No results for input(s): LIPASE, AMYLASE in the last 72 hours. CBC: Recent Labs    03/14/21 0128 03/15/21 0422  WBC 5.6 10.9*  NEUTROABS  --  10.0*  HGB 9.9* 11.1*  HCT 30.6* 33.9*  MCV 96.2 95.8  PLT 142* 168     Imaging: 03/16/21 EKG SITE RITE  Result Date: 03/13/2021 If Site Rite image not attached, placement could not be confirmed due to current cardiac rhythm.   Cardiac Studies:  ECG: AV pacing    Telemetry: AV pacing no PAF   Echo: 03/10/21 EF 60-65% AV sclerosis mild/mod AR   Medications:    amiodarone  200 mg Oral Daily   carvedilol  3.125 mg Oral BID WC   Chlorhexidine Gluconate Cloth  6 each Topical Daily   docusate sodium  100 mg Oral BID   levothyroxine  75 mcg Oral  Q0600   melatonin  3 mg Oral QHS   mupirocin ointment  1 application Nasal BID   pantoprazole (PROTONIX) IV  40 mg Intravenous Q24H   rosuvastatin  20 mg Oral Daily      heparin     promethazine (PHENERGAN) injection (IM or IVPB)      Assessment/Plan:   PPM:  normal function  PAF  In sinus with AV pacing not taking PO can use iv amiodarone should she have any PAF but she was on it chronically and will still be in her system for a while Resume eliquis per surgery when bleeding risk deemed low   14/03/2021 03/15/2021, 9:01 AM

## 2021-03-15 NOTE — Significant Event (Signed)
Patient's nurse notified me that patient has been having severe abdominal pain.  On exam at bedside patient is complaining of generalized abdominal pain pain most centered around the epigastric and around the PEG tube area.  Abdomen appears soft.  Denies any chest pain or shortness of breath.  Patient has a Foley catheter which is draining urine.  Blood pressure is 135/50 pulse 60, temperature 97.8 respiration 60/min respiration 18/min.  Medications noted.  Labs noted.  I discussed with on-call general surgeon Dr. Derrell Lolling who advised to change her morphine to Dilaudid and also give a dose of Robaxin.  I advised the patient's nurse that if patient's pain does not improve with this to notify general surgery immediately.  We will also follow morning labs.  Midge Minium

## 2021-03-15 NOTE — Progress Notes (Signed)
Occupational Therapy Evaluation Patient Details Name: Danielle Blanchard MRN: [id number] DOB: July 12, 1940 Today's Date: 03/15/2021   History of Present Illness 80 y.o female with medical history significant of sick sinus syndrome post PPM, chronic diastolic CHF, hypertension, hyperlipidemia, GERD.  Recently admitted on 11/12 for left upper quadrant abdominal pain.  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  Patient was seen by GI and underwent decompression by endoscopy on 11/14.  She returns to the ED with left-sided chest pain.  Chest x-ray showing stable large hiatal hernia.  CT chest/abdomen/pelvis showing large hiatal hernia.  Pt underwent G-tube placement on 12/13.   Clinical Impression   Pt drowsy and in a lot of pain at this time, niece in room. Pt disoriented to place, knows she is in hospital but continues to think she is in bed instead of in the chair. Pt  min-mod A for ADLs, min A bed mobility, and min A for transfers. Pt nauseaus throughout session hindering progression of ADLs and functional mobility. Will continue to follow acutely, recommendation updated to SNF at d/c due to decreased assistance at home.     Recommendations for follow up therapy are one component of a multi-disciplinary discharge planning process, led by the attending physician.  Recommendations may be updated based on patient status, additional functional criteria and insurance authorization.   Follow Up Recommendations  Skilled nursing-short term rehab (<3 hours/day)    Assistance Recommended at Discharge Intermittent Supervision/Assistance  Functional Status Assessment  Patient has had a recent decline in their functional status and demonstrates the ability to make significant improvements in function in a reasonable and predictable amount of time.  Equipment Recommendations  None recommended by OT;Other (comment) (defer to next venue of care)    Recommendations for Other  Services PT consult     Precautions / Restrictions Precautions Precautions: Fall Precaution Comments: abdominal pain Restrictions Weight Bearing Restrictions: No      Mobility Bed Mobility Overal bed mobility: Needs Assistance Bed Mobility: Supine to Sit     Supine to sit: Min assist     General bed mobility comments: verbal cues needed for alignment, min A to bring LE's onto bed    Transfers Overall transfer level: Needs assistance Equipment used: Rolling walker (2 wheels) Transfers: Sit to/from Stand;Bed to chair/wheelchair/BSC Sit to Stand: Min assist     Step pivot transfers: Min assist     General transfer comment: increased time      Balance Overall balance assessment: Needs assistance Sitting-balance support: Feet supported Sitting balance-Leahy Scale: Fair Sitting balance - Comments: pt leaning to the L due to pain   Standing balance support: Bilateral upper extremity supported Standing balance-Leahy Scale: Poor Standing balance comment: dependent on RW                           ADL either performed or assessed with clinical judgement   ADL Overall ADL's : Needs assistance/impaired Eating/Feeding: Set up;Sitting   Grooming: Wash/dry hands;Min guard;Standing   Upper Body Bathing: Sitting;Moderate assistance   Lower Body Bathing: Sitting/lateral leans;Moderate assistance   Upper Body Dressing : Sitting;Moderate assistance   Lower Body Dressing: Moderate assistance;Sitting/lateral leans;Sit to/from stand   Toilet Transfer: Stand-pivot;BSC/3in1;Moderate assistance Toilet Transfer Details (indicate cue type and reason): simulated Chair > bed Toileting- Clothing Manipulation and Hygiene: Sit to/from stand;Moderate assistance       Functional mobility during ADLs: Minimal assistance;Cueing for sequencing;Cueing for safety;Moderate assistance  Vision Patient Visual Report: No change from baseline Vision Assessment?: No apparent  visual deficits     Perception     Praxis      Pertinent Vitals/Pain Pain Assessment: Faces Pain Score: 7  Faces Pain Scale: Hurts whole lot Pain Location: abdomen Pain Descriptors / Indicators: Aching;Constant;Discomfort;Cramping;Grimacing Pain Intervention(s): Limited activity within patient's tolerance;Monitored during session;Repositioned     Hand Dominance Right   Extremity/Trunk Assessment Upper Extremity Assessment Upper Extremity Assessment: Overall WFL for tasks assessed   Lower Extremity Assessment Lower Extremity Assessment: Defer to PT evaluation   Cervical / Trunk Assessment Cervical / Trunk Assessment: Normal   Communication Communication Communication: No difficulties   Cognition Arousal/Alertness: Lethargic Behavior During Therapy: Flat affect Overall Cognitive Status: History of cognitive impairments - at baseline                                 General Comments: pt thinking she is in bed when she is acutually in chair     General Comments  Pt states feeling dizzy when up in chair, BP 174/65 while sitting upright, BP 178/75 after stand pivot transfer    Exercises     Shoulder Instructions      Home Living Family/patient expects to be discharged to:: Private residence Living Arrangements: Children Available Help at Discharge: Family;Available PRN/intermittently Type of Home: Apartment Home Access: Stairs to enter Entrance Stairs-Number of Steps: 17 Entrance Stairs-Rails: Right;Left Home Layout: One level     Bathroom Shower/Tub: Chief Strategy Officer: Standard Bathroom Accessibility: Yes How Accessible: Accessible via walker Home Equipment: Rolling Walker (2 wheels);Cane - single point   Additional Comments: Son works first shift, and she's home alone during this time.      Prior Functioning/Environment Prior Level of Function : Independent/Modified Independent             Mobility Comments: Walks  household distances without RW or SPC.  Increased effort for stairs. ADLs Comments: Patient completes her own showers, light meal prep.  Son sets up medications and assist with community mobility and bill payment.        OT Problem List: Decreased activity tolerance;Impaired balance (sitting and/or standing)      OT Treatment/Interventions: Self-care/ADL training;Therapeutic activities;Balance training;Patient/family education    OT Goals(Current goals can be found in the care plan section) Acute Rehab OT Goals Patient Stated Goal: none stated OT Goal Formulation: With patient Time For Goal Achievement: 03/24/21 Potential to Achieve Goals: Good  OT Frequency: Min 2X/week   Barriers to D/C: Decreased caregiver support          Co-evaluation              AM-PAC OT "6 Clicks" Daily Activity     Outcome Measure Help from another person eating meals?: None Help from another person taking care of personal grooming?: A Little Help from another person toileting, which includes using toliet, bedpan, or urinal?: A Lot Help from another person bathing (including washing, rinsing, drying)?: A Lot Help from another person to put on and taking off regular upper body clothing?: A Lot Help from another person to put on and taking off regular lower body clothing?: A Lot 6 Click Score: 15   End of Session Equipment Utilized During Treatment: Gait belt Nurse Communication: Mobility status;Other (comment) (Pt's BP and request for new lunch tray)  Activity Tolerance: Patient tolerated treatment well Patient left: in bed;with call  bell/phone within reach;with bed alarm set;with family/visitor present  OT Visit Diagnosis: Unsteadiness on feet (R26.81)                Time: 3474-2595 OT Time Calculation (min): 26 min Charges:  OT General Charges $OT Visit: 1 Visit OT Evaluation $OT Eval Low Complexity: 1 Low OT Treatments $Self Care/Home Management : 8-22 mins  Alfonzo Beers, OTD,  OTR/L Acute Rehab [phone]) [phone]   Mayer Masker 03/15/2021, 11:31 AM

## 2021-03-15 NOTE — Progress Notes (Signed)
ANTICOAGULATION CONSULT NOTE  Pharmacy Consult for heparin Indication: atrial fibrillation  No Known Allergies  Patient Measurements: Height: 5\' 2"  (157.5 cm) Weight: 70.2 kg (154 lb 12.8 oz) IBW/kg (Calculated) : 50.1 Heparin Dosing Weight: 65.4 kg  Vital Signs: Temp: 97.9 F (36.6 C) (12/14 1548) Temp Source: Oral (12/14 1548) BP: 155/79 (12/14 1548) Pulse Rate: 59 (12/14 1548)  Labs: Recent Labs    03/13/21 0053 03/14/21 0128 03/15/21 0422 03/15/21 1811  HGB 9.2* 9.9* 11.1*  --   HCT 28.7* 30.6* 33.9*  --   PLT 142* 142* 168  --   APTT 89* 76*  --  36  HEPARINUNFRC 0.76* 0.48  --  0.13*  CREATININE 0.68 0.85 0.85  --      Estimated Creatinine Clearance: 46.8 mL/min (by C-G formula based on SCr of 0.85 mg/dL).   Assessment:  80 yo F with pAF (PTA eliquis), CHF, HTN, HLD and GERD. Pt with N/V bc of recurrent gastric volvulus with partial obstruction. GI and surgery following with plans for surgical reduction and repair of hernia. Holding PTA eliquis, last dose 12/8 AM - heparin gtt in the meantime.    12/13 heparin held for hernia repair. Per team, ok to restart 12/14 at 10 am, no bolus.   PM update - heparin level low at 0.13 (aPTT also low at 36) s/p resumption. CBC improved. No bleeding or issues with infusion per discussion with RN.  Goal of Therapy:  Heparin level 0.3-0.7 units/ml aPTT 66-102 seconds Monitor platelets by anticoagulation protocol: Yes   Plan:  Increase heparin to 1000 units/hr Check 8hr aPTT/heparin level. F/u aPTT until correlates with heparin level - confirm with next labs Monitor daily CBC, heparin level, aPTT, s/sx bleeding F/u apixaban restart   1/15, PharmD, BCPS Please check AMION for all [facility] contact numbers Clinical Pharmacist 03/15/2021 8:26 PM

## 2021-03-15 NOTE — Progress Notes (Signed)
General Surgery Follow Up Note  Subjective:    Overnight Issues:   Objective:  Vital signs for last 24 hours: Temp:  [97.3 F (36.3 C)-98.7 F (37.1 C)] 97.9 F (36.6 C) (12/14 1548) Pulse Rate:  [59-62] 59 (12/14 1548) Resp:  [18-19] 19 (12/14 1548) BP: (123-184)/(54-79) 155/79 (12/14 1548) SpO2:  [93 %-97 %] 97 % (12/14 1548)  Hemodynamic parameters for last 24 hours:    Intake/Output from previous day: 12/13 0701 - 12/14 0700 In: 1946.3 [I.V.:1896.3; IV Piggyback:50] Out: 490 [Urine:480; Blood:10]  Intake/Output this shift: Total I/O In: 969.2 [P.O.:960; I.V.:9.2] Out: 150 [Urine:150]  Vent settings for last 24 hours:    Physical Exam:  Gen: comfortable, no distress Neuro: non-focal exam HEENT: PERRL Neck: supple CV: RRR Pulm: unlabored breathing Abd: soft, appropriately TTP, PEG at 4 at the skin GU: clear yellow urine Extr: wwp, no edema   Results for orders placed or performed during the hospital encounter of 03/09/21 (from the past 24 hour(s))  Comprehensive metabolic panel     Status: Abnormal   Collection Time: 03/15/21  4:22 AM  Result Value Ref Range   Sodium 135 135 - 145 mmol/L   Potassium 4.1 3.5 - 5.1 mmol/L   Chloride 104 98 - 111 mmol/L   CO2 22 22 - 32 mmol/L   Glucose, Bld 161 (H) 70 - 99 mg/dL   BUN 10 8 - 23 mg/dL   Creatinine, Ser 2.02 0.44 - 1.00 mg/dL   Calcium 8.6 (L) 8.9 - 10.3 mg/dL   Total Protein 6.3 (L) 6.5 - 8.1 g/dL   Albumin 3.0 (L) 3.5 - 5.0 g/dL   AST 56 (H) 15 - 41 U/L   ALT 45 (H) 0 - 44 U/L   Alkaline Phosphatase 91 38 - 126 U/L   Total Bilirubin 1.1 0.3 - 1.2 mg/dL   GFR, Estimated >54 >27 mL/min   Anion gap 9 5 - 15  Magnesium     Status: None   Collection Time: 03/15/21  4:22 AM  Result Value Ref Range   Magnesium 1.9 1.7 - 2.4 mg/dL  Phosphorus     Status: None   Collection Time: 03/15/21  4:22 AM  Result Value Ref Range   Phosphorus 4.5 2.5 - 4.6 mg/dL  CBC with Differential/Platelet     Status:  Abnormal   Collection Time: 03/15/21  4:22 AM  Result Value Ref Range   WBC 10.9 (H) 4.0 - 10.5 K/uL   RBC 3.54 (L) 3.87 - 5.11 MIL/uL   Hemoglobin 11.1 (L) 12.0 - 15.0 g/dL   HCT 06.2 (L) 37.6 - 28.3 %   MCV 95.8 80.0 - 100.0 fL   MCH 31.4 26.0 - 34.0 pg   MCHC 32.7 30.0 - 36.0 g/dL   RDW 15.1 (H) 76.1 - 60.7 %   Platelets 168 150 - 400 K/uL   nRBC 0.0 0.0 - 0.2 %   Neutrophils Relative % 92 %   Neutro Abs 10.0 (H) 1.7 - 7.7 K/uL   Lymphocytes Relative 4 %   Lymphs Abs 0.4 (L) 0.7 - 4.0 K/uL   Monocytes Relative 3 %   Monocytes Absolute 0.3 0.1 - 1.0 K/uL   Eosinophils Relative 0 %   Eosinophils Absolute 0.0 0.0 - 0.5 K/uL   Basophils Relative 0 %   Basophils Absolute 0.0 0.0 - 0.1 K/uL   Immature Granulocytes 1 %   Abs Immature Granulocytes 0.13 (H) 0.00 - 0.07 K/uL  Lactic acid, plasma  Status: None   Collection Time: 03/15/21  4:22 AM  Result Value Ref Range   Lactic Acid, Venous 0.9 0.5 - 1.9 mmol/L    Assessment & Plan: Present on Admission:  Gastric volvulus  A-fib (HCC)  Sick sinus syndrome (HCC)  Memory changes  Essential hypertension    LOS: 5 days   Additional comments:I reviewed the patient's new clinical lab test results.   and I reviewed the patients new imaging test results.    Hiatal hernia - POD1 s/p robotic hital hernia repair with PEG placement 12/13 by Dr. Hassell Done. Okay for CLD and clamp g-tube. Place abdominal abinder as ordered post-op.  FEN - CLD DVT - SCDs, heparin drip okay to resume today Dispo - medsurg   Jesusita Oka, MD Trauma & General Surgery Please use [email or web address] to contact on call provider  03/15/2021  *Care during the described time interval was provided by me. I have reviewed this patient's available data, including medical history, events of note, physical examination and test results as part of my evaluation.

## 2021-03-15 NOTE — Progress Notes (Addendum)
PROGRESS NOTE    Danielle Blanchard  BPZ:[id number] DOB: [birth year] DOA: 03/09/2021 PCP: [facility]   Brief Narrative: 80 year old with past medical history significant for sick sinus syndrome post PPM, paroxysmal A. 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  She presented with left side chest pain 12/09, CT chest abdomen and pelvis showed large hiatal hernia with gastric body and antrum intrathoracic in location and air-fluid levels in the abdomen, unchanged from previous exam.   Patient was evaluated by GI who recommended surgical consultation no need for repeat endoscopy.  She was evaluated by surgery, he was treated conservatively with bowel rest and IV fluids.  Surgery recommended robotic XI taken down of the stomach and sac from the mediastinum with endoscopic PEG placed by Dr. Bedelia Person on 03/15/2021.       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 : -Patient recently discharged from the same month. -She was treated conservatively with bowel rest and IV fluids her symptoms improve. -Surgery evaluated patient and proceeded with robotic Xi taken down of the stomach and sac from the mediastinum with endoscopic PEG placed by Dr. Bedelia Person on 03/15/2021. -Started on clear diet -Further management per surgery -Abdominal pain less severe this morning.   2-Paroxysmal A. fib: Coreg and amiodarone On Heparin Gtt.  Transition to oral anticoagulation  when ok by sx.   3-HTN: Continue with Coreg  4-Sinus syndrome status post pacemaker  5-Chronic Diastolic heart failure Monitor volume status.   6-Hypokalemia: Resolved.   Mild transaminases; monitor.   Nutrition Problem: Inadequate oral intake Etiology: inability to eat    Signs/Symptoms: NPO status    Interventions: TPN  Estimated body mass index is 28.31 kg/m as calculated from the following:   Height as of this encounter: 5\' 2"  (1.575 m).   Weight as of this encounter: 70.2 kg.   DVT prophylaxis: Heparin Gtt Code Status: Full code Family Communication: Disposition Plan:  Status is: Inpatient  Remains inpatient appropriate because: gastric volvulus post sx.         Consultants:  Surgery GI  Procedures:    Antimicrobials:    Subjective: She is alert, sitting recliner. Didn't realized she had surgery. Report mild abdominal pain.   Objective: Vitals:   03/15/21 0014 03/15/21 0435 03/15/21 0741 03/15/21 0745  BP: (!) 135/54 (!) 146/65 (!) 155/65 (!) 155/65  Pulse: 60 62 (!) 59 (!) 59  Resp: 18 18  18   Temp: 97.8 F (36.6 C) 98.7 F (37.1 C)  (!) 97.3 F (36.3 C)  TempSrc: Oral Oral  Oral  SpO2: 95% 93%  96%  Weight:      Height:        Intake/Output Summary (Last 24 hours) at 03/15/2021 0946 Last data filed at 03/15/2021 0817 Gross per 24 hour  Intake 2166.28 ml  Output 490 ml  Net 1676.28 ml   Filed Weights   03/09/21 2224 03/13/21 1123  Weight: 71.8 kg 70.2 kg    Examination:  General exam: Appears calm and  comfortable  Respiratory system: Clear to auscultation. Respiratory effort normal. Cardiovascular system: S1 & S2 heard, RRR. No JVD, murmurs, rubs, gallops or clicks. No pedal edema. Gastrointestinal system: Abdomen is nondistended, soft and nontender. GT tube in place.  Central nervous system: Alert and oriented. No focal neurological deficits. Extremities: Symmetric 5 x 5 power.    Data Reviewed: I have personally  reviewed following labs and imaging studies  CBC: Recent Labs  Lab 03/09/21 2223 03/11/21 0044 03/12/21 0053 03/13/21 0053 03/14/21 0128 03/15/21 0422  WBC 5.9 4.5 5.8 5.2 5.6 10.9*  NEUTROABS 3.8  --   --   --   --  10.0*  HGB 10.4* 9.5* 10.4* 9.2* 9.9* 11.1*  HCT 31.9* 29.7* 30.9* 28.7* 30.6* 33.9*  MCV 96.1 96.1 93.6 96.0 96.2 95.8  PLT 189 146* 138* 142* 142* [id number]   Basic Metabolic Panel: Recent Labs  Lab 03/10/21 0401 03/11/21 0044 03/12/21 0053 03/13/21 0053 03/14/21 0128 03/15/21 0422  NA 137 139 137 140 138 135  K 2.8* 4.2 3.5 4.1 3.5 4.1  CL 95* 103 101 107 107 104  CO2 31 29 25 25 23 22   GLUCOSE 124* 78 85 92 87 161*  BUN 19 8 7* 5* <5* 10  CREATININE 0.96 0.74 0.75 0.68 0.85 0.85  CALCIUM 8.9 8.7* 8.7* 8.8* 8.6* 8.6*  MG 2.1 1.9 1.8 2.0  --  1.9  PHOS  --   --   --  3.2  --  4.5   GFR: Estimated Creatinine Clearance: 46.8 mL/min (by C-G formula based on SCr of 0.85 mg/dL). Liver Function Tests: Recent Labs  Lab 03/11/21 0044 03/12/21 0053 03/13/21 0053 03/14/21 0128 03/15/21 0422  AST 35 38 35 36 56*  ALT 38 41 36 35 45*  ALKPHOS 63 68 65 65 91  BILITOT 0.5 0.6 0.7 0.8 1.1  PROT 5.2* 5.5* 5.6* 5.5* 6.3*  ALBUMIN 2.8* 3.2* 3.0* 2.9* 3.0*   No results for input(s): LIPASE, AMYLASE in the last 168 hours. No results for input(s): AMMONIA in the last 168 hours. Coagulation Profile: No results for input(s): INR, PROTIME in the last 168 hours. Cardiac Enzymes: No results for input(s): CKTOTAL, CKMB, CKMBINDEX, TROPONINI in the last 168 hours. BNP (last 3 results) No results for input(s): PROBNP in the last 8760 hours. HbA1C: No results for input(s): HGBA1C in the last 72 hours. CBG: No results for input(s): GLUCAP in the last 168 hours. Lipid Profile: No results for input(s): CHOL, HDL, LDLCALC, TRIG, CHOLHDL, LDLDIRECT in the last 72 hours. Thyroid Function Tests: No results for input(s): TSH, T4TOTAL, FREET4, T3FREE, THYROIDAB in the last 72  hours. Anemia Panel: No results for input(s): VITAMINB12, FOLATE, FERRITIN, TIBC, IRON, RETICCTPCT in the last 72 hours. Sepsis Labs: Recent Labs  Lab 03/15/21 0422  LATICACIDVEN 0.9    Recent Results (from the past 240 hour(s))  Resp Panel by RT-PCR (Flu A&B, Covid) Nasopharyngeal Swab     Status: None   Collection Time: 03/10/21  1:56 AM   Specimen: Nasopharyngeal Swab; Nasopharyngeal(NP) swabs in vial transport medium  Result Value Ref Range Status   SARS Coronavirus 2 by RT PCR NEGATIVE NEGATIVE Final    Comment: (NOTE) SARS-CoV-2 target nucleic acids are NOT DETECTED.  The SARS-CoV-2 RNA is generally detectable in upper respiratory specimens during the acute phase of infection. The lowest concentration of SARS-CoV-2 viral copies this assay can detect is 138 copies/mL. A negative result does not preclude SARS-Cov-2 infection and should not be used  as the sole basis for treatment or other patient management decisions. A negative result may occur with  improper specimen collection/handling, submission of specimen other than nasopharyngeal swab, presence of viral mutation(s) within the areas targeted by this assay, and inadequate number of viral copies(<138 copies/mL). A negative result must be combined with clinical observations, patient history, and epidemiological information. The expected result is Negative.  Fact Sheet for Patients:  BloggerCourse.com  Fact Sheet for Healthcare Providers:  SeriousBroker.it  This test is no t yet approved or cleared by the [place] FDA and  has been authorized for detection and/or diagnosis of SARS-CoV-2 by FDA under an Emergency Use Authorization (EUA). This EUA will remain  in effect (meaning this test can be used) for the duration of the COVID-19 declaration under Section 564(b)(1) of the Act, 21 U.S.C.section 360bbb-3(b)(1), unless the authorization is terminated  or revoked  sooner.       Influenza A by PCR NEGATIVE NEGATIVE Final   Influenza B by PCR NEGATIVE NEGATIVE Final    Comment: (NOTE) The Xpert Xpress SARS-CoV-2/FLU/RSV plus assay is intended as an aid in the diagnosis of influenza from Nasopharyngeal swab specimens and should not be used as a sole basis for treatment. Nasal washings and aspirates are unacceptable for Xpert Xpress SARS-CoV-2/FLU/RSV testing.  Fact Sheet for Patients: BloggerCourse.com  Fact Sheet for Healthcare Providers: SeriousBroker.it  This test is not yet approved or cleared by the [place] FDA and has been authorized for detection and/or diagnosis of SARS-CoV-2 by FDA under an Emergency Use Authorization (EUA). This EUA will remain in effect (meaning this test can be used) for the duration of the COVID-19 declaration under Section 564(b)(1) of the Act, 21 U.S.C. section 360bbb-3(b)(1), unless the authorization is terminated or revoked.  Performed at [facility], [address] [place], [place] [address]   Surgical pcr screen     Status: Abnormal   Collection Time: 03/11/21  6:12 AM   Specimen: Nasal Mucosa; Nasal Swab  Result Value Ref Range Status   MRSA, PCR NEGATIVE NEGATIVE Final   Staphylococcus aureus POSITIVE (A) NEGATIVE Final    Comment: (NOTE) The Xpert SA Assay (FDA approved for NASAL specimens in patients 13 years of age and older), is one component of a comprehensive surveillance program. It is not intended to diagnose infection nor to guide or monitor treatment. Performed at [facility], [address] [place], [place] [address]          Radiology Studies: [place] EKG SITE RITE  Result Date: 03/13/2021 If Site Rite image not attached, placement could not be confirmed due to current cardiac rhythm.       Scheduled Meds:  amiodarone  200 mg Oral Daily   carvedilol  3.125 mg Oral BID WC   Chlorhexidine Gluconate Cloth   6 each Topical Daily   docusate sodium  100 mg Oral BID   levothyroxine  75 mcg Oral Q0600   melatonin  3 mg Oral QHS   mupirocin ointment  1 application Nasal BID   pantoprazole (PROTONIX) IV  40 mg Intravenous Q24H   rosuvastatin  20 mg Oral Daily   Continuous Infusions:  heparin     promethazine (PHENERGAN) injection (IM or IVPB)       LOS: 5 days    Time spent: 35 minutes    Tabor Denham A Samaiya Awadallah, MD [facility]   If 7PM-7AM, please contact night-coverage [email or web address]  03/15/2021, 9:46 AM

## 2021-03-15 NOTE — Discharge Instructions (Signed)
[facility] LAPAROSCOPIC OR ROBOTIC SURGERY: POST OP INSTRUCTIONS Always review your discharge instruction sheet given to you by the facility where your surgery was performed. IF YOU HAVE DISABILITY OR FAMILY LEAVE FORMS, YOU MUST BRING THEM TO THE OFFICE FOR PROCESSING.   DO NOT GIVE THEM TO YOUR DOCTOR.  PAIN CONTROL  First take acetaminophen (Tylenol) AND/or ibuprofen (Advil) to control your pain after surgery.  Follow directions on package.  Taking acetaminophen (Tylenol) and/or ibuprofen (Advil) regularly after surgery will help to control your pain and lower the amount of prescription pain medication you may need.  You should not take more than 3,000 mg (3 grams) of acetaminophen (Tylenol) in 24 hours.  You should not take ibuprofen (Advil), aleve, motrin, naprosyn or other NSAIDS if you have a history of stomach ulcers or chronic kidney disease.  A prescription for pain medication may be given to you upon discharge.  Take your pain medication as prescribed, if you still have uncontrolled pain after taking acetaminophen (Tylenol) or ibuprofen (Advil). Use ice packs to help control pain. If you need a refill on your pain medication, please contact your pharmacy.  They will contact our office to request authorization. Prescriptions will not be filled after 5pm or on week-ends.  HOME MEDICATIONS Take your usually prescribed medications unless otherwise directed.  DIET You should follow a light diet the first few days after arrival home.  Be sure to include lots of fluids daily. Avoid fatty, fried foods.   CONSTIPATION It is common to experience some constipation after surgery and if you are taking pain medication.  Increasing fluid intake and taking a stool softener (such as Colace) will usually help or prevent this problem from occurring.  A mild laxative (Milk of Magnesia or Miralax) should be taken according to package instructions if there are no bowel movements  after 48 hours.  WOUND/INCISION CARE Most patients will experience some swelling and bruising in the area of the incisions.  Ice packs will help.  Swelling and bruising can take several days to resolve.  Unless discharge instructions indicate otherwise, follow guidelines below  STERI-STRIPS - you may remove your outer bandages 48 hours after surgery, and you may shower at that time.  You have steri-strips (small skin tapes) in place directly over the incision.  These strips should be left on the skin for 7-10 days.   DERMABOND/SKIN GLUE - you may shower in 24 hours.  The glue will flake off over the next 2-3 weeks. Any sutures or staples will be removed at the office during your follow-up visit.  ACTIVITIES You may resume regular (light) daily activities beginning the next day--such as daily self-care, walking, climbing stairs--gradually increasing activities as tolerated.  You may have sexual intercourse when it is comfortable.  Refrain from any heavy lifting or straining until approved by your doctor. You may drive when you are no longer taking prescription pain medication, you can comfortably wear a seatbelt, and you can safely maneuver your car and apply brakes.  FOLLOW-UP You should see your doctor in the office for a follow-up appointment approximately 2-3 weeks after your surgery.  You should have been given your post-op/follow-up appointment when your surgery was scheduled.  If you did not receive a post-op/follow-up appointment, make sure that you call for this appointment within a day or two after you arrive home to insure a convenient appointment time.   WHEN TO CALL YOUR DOCTOR: Fever over 101.0 Inability to urinate Continued bleeding  from incision. Increased pain, redness, or drainage from the incision. Increasing abdominal pain  The clinic staff is available to answer your questions during regular business hours.  Please dont hesitate to call and ask to speak to one of the  nurses for clinical concerns.  If you have a medical emergency, go to the nearest emergency room or call 911.  A surgeon from [facility] is always on call at the hospital. [address], [place], [place]  [address] ? [address] [address], [place], [place]   [address] [phone] ? [phone] ? FAX [phone] Web site: [email or web address]

## 2021-03-15 NOTE — Progress Notes (Signed)
Patient declined getting out of bed and walking

## 2021-03-15 NOTE — Progress Notes (Signed)
Physical Therapy RE-EVALUATION  Patient Details Name: Danielle Blanchard MRN: [id number] DOB: [birth year] Today's Date: 03/15/2021   History of Present Illness 80 y.o female with medical history significant of sick sinus syndrome post PPM, chronic diastolic CHF, hypertension, hyperlipidemia, GERD.  Recently admitted on 11/12 for left upper quadrant abdominal pain.  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  Patient was seen by GI and underwent decompression by endoscopy on 11/14.  She returns to the ED with left-sided chest pain.  Chest x-ray showing stable large hiatal hernia.  CT chest/abdomen/pelvis showing large hiatal hernia.  Pt underwent G-tube placement on 12/13.    PT Comments    Pt underwent g-tube placement yesterday. Pt now with lethargy and confusion (asking "when are they doing my surgery") suspect from pain medicine. Pt limited to stand pvt to chair due to onset of dizziness with standing and increased abdominal pain. Spoke with pt's son [name] who states "I am the only kin and I can't take off of work to help her." Pt was indep with mobility and ADLs prior to admit. At this time unfortunately pt is unsafe to be home alone at this time. Family is interested in hiring someone to stay with her however he can't afford out of pocket costs. Recommend ST-SNF at this time to allow pt to achieve safe mod I level of function for safe transition home. Acute PT to cont to follow.   Recommendations for follow up therapy are one component of a multi-disciplinary discharge planning process, led by the attending physician.  Recommendations may be updated based on patient status, additional functional criteria and insurance authorization.  Follow Up Recommendations  Skilled nursing-short term rehab (<3 hours/day)     Assistance Recommended at Discharge Frequent or constant Supervision/Assistance  Equipment Recommendations   (TBD at next venue)    Recommendations  for Other Services       Precautions / Restrictions Precautions Precautions: Fall Precaution Comments: abdominal pain Restrictions Weight Bearing Restrictions: No     Mobility  Bed Mobility Overal bed mobility: Needs Assistance Bed Mobility: Supine to Sit     Supine to sit: Min assist     General bed mobility comments: max directional verbal cues, tactile cues to bring R UE across body to use bedrail to help pull self up, minA for trunk elevation, increased time    Transfers Overall transfer level: Needs assistance Equipment used: Rolling walker (2 wheels) Transfers: Sit to/from Stand;Bed to chair/wheelchair/BSC Sit to Stand: Min assist     Step pivot transfers: Min assist     General transfer comment: increased time, mildly labored effort due to abdominal pain, minA to power up and steady during transition of hands, minA for walker management during transfer to chair, pt with c/o dizziness and abdominal pain, pt did receive pain meds prior to PT session    Ambulation/Gait               General Gait Details: unable to ambulate this session due to dizziness with upright position,abdominal pain, and lethargy   Stairs             Wheelchair Mobility    Modified Rankin (Stroke Patients Only)       Balance Overall balance assessment: Needs assistance Sitting-balance support: Feet supported Sitting balance-Leahy Scale: Fair Sitting balance - Comments: pt leaning to the L due to pain   Standing balance support: Bilateral upper extremity supported Standing balance-Leahy Scale: Poor Standing balance comment: dependent on  RW                            Cognition Arousal/Alertness: Lethargic Behavior During Therapy: Flat affect Overall Cognitive Status: History of cognitive impairments - at baseline                                 General Comments: pt with confusion asking "when are they doing my surgery". Pt re-oriented. Pt  does state she wishes someone could stay with her during the day. Pt with delayed processing        Exercises      General Comments General comments (skin integrity, edema, etc.): suspect drop in BP upon standing however was unable to locate dynamap to take pt BP, SPO2 at 87% on RA, O2 placed back on for session      Pertinent Vitals/Pain Pain Assessment: No/denies pain    Home Living                          Prior Function            PT Goals (current goals can now be found in the care plan section) Progress towards PT goals: Not progressing toward goals - comment (pt with surgery yesterday and experiencing increased pain)    Frequency    Min 3X/week      PT Plan Discharge plan needs to be updated    Co-evaluation              AM-PAC PT "6 Clicks" Mobility   Outcome Measure  Help needed turning from your back to your side while in a flat bed without using bedrails?: A Little Help needed moving from lying on your back to sitting on the side of a flat bed without using bedrails?: A Little Help needed moving to and from a bed to a chair (including a wheelchair)?: A Little Help needed standing up from a chair using your arms (e.g., wheelchair or bedside chair)?: A Lot Help needed to walk in hospital room?: A Lot Help needed climbing 3-5 steps with a railing? : Total 6 Click Score: 14    End of Session Equipment Utilized During Treatment: Gait belt;Oxygen Activity Tolerance: Patient limited by pain;Patient limited by lethargy;Patient limited by fatigue Patient left: in chair;with call bell/phone within reach;with chair alarm set Nurse Communication: Mobility status PT Visit Diagnosis: Unsteadiness on feet (R26.81)     Time: 5945-8592 PT Time Calculation (min) (ACUTE ONLY): 24 min  Charges:  $Therapeutic Activity: 8-22 mins                     Lewis Shock, PT, DPT Acute Rehabilitation Services Pager #: [phone] Office #:  [phone]    Iona Hansen 03/15/2021, 10:34 AM

## 2021-03-16 ENCOUNTER — Inpatient Hospital Stay (HOSPITAL_COMMUNITY): Payer: Medicare Other

## 2021-03-16 LAB — CBC
HCT: 35.5 % — ABNORMAL LOW (ref 36.0–46.0)
Hemoglobin: 11.3 g/dL — ABNORMAL LOW (ref 12.0–15.0)
MCH: 31 pg (ref 26.0–34.0)
MCHC: 31.8 g/dL (ref 30.0–36.0)
MCV: 97.3 fL (ref 80.0–100.0)
Platelets: 174 K/uL (ref 150–400)
RBC: 3.65 MIL/uL — ABNORMAL LOW (ref 3.87–5.11)
RDW: 16.2 % — ABNORMAL HIGH (ref 11.5–15.5)
WBC: 18 K/uL — ABNORMAL HIGH (ref 4.0–10.5)
nRBC: 0 % (ref 0.0–0.2)

## 2021-03-16 LAB — COMPREHENSIVE METABOLIC PANEL WITH GFR
ALT: 51 U/L — ABNORMAL HIGH (ref 0–44)
AST: 58 U/L — ABNORMAL HIGH (ref 15–41)
Albumin: 3.1 g/dL — ABNORMAL LOW (ref 3.5–5.0)
Alkaline Phosphatase: 110 U/L (ref 38–126)
Anion gap: 8 (ref 5–15)
BUN: 15 mg/dL (ref 8–23)
CO2: 25 mmol/L (ref 22–32)
Calcium: 8.9 mg/dL (ref 8.9–10.3)
Chloride: 101 mmol/L (ref 98–111)
Creatinine, Ser: 0.71 mg/dL (ref 0.44–1.00)
GFR, Estimated: >60 mL/min
Glucose, Bld: 137 mg/dL — ABNORMAL HIGH (ref 70–99)
Potassium: 4 mmol/L (ref 3.5–5.1)
Sodium: 134 mmol/L — ABNORMAL LOW (ref 135–145)
Total Bilirubin: 1.5 mg/dL — ABNORMAL HIGH (ref 0.3–1.2)
Total Protein: 6 g/dL — ABNORMAL LOW (ref 6.5–8.1)

## 2021-03-16 LAB — URINALYSIS, ROUTINE W REFLEX MICROSCOPIC
Glucose, UA: NEGATIVE mg/dL
Ketones, ur: NEGATIVE mg/dL
Leukocytes,Ua: NEGATIVE
Nitrite: NEGATIVE
Protein, ur: 30 mg/dL — AB
Specific Gravity, Urine: >1.03 — ABNORMAL HIGH (ref 1.005–1.030)
pH: 5.5 (ref 5.0–8.0)

## 2021-03-16 LAB — URINALYSIS, MICROSCOPIC (REFLEX)

## 2021-03-16 LAB — APTT: aPTT: 57 seconds — ABNORMAL HIGH (ref 24–36)

## 2021-03-16 LAB — HEPARIN LEVEL (UNFRACTIONATED)
Heparin Unfractionated: 0.62 IU/mL (ref 0.30–0.70)
Heparin Unfractionated: 0.68 IU/mL (ref 0.30–0.70)

## 2021-03-16 MED ORDER — PANTOPRAZOLE SODIUM 40 MG PO TBEC
40.0000 mg | DELAYED_RELEASE_TABLET | Freq: Every day | ORAL | Status: DC
  Administered 2021-03-16 – 2021-03-21 (×6): 40 mg via ORAL
  Filled 2021-03-16 (×6): qty 1

## 2021-03-16 MED ORDER — VANCOMYCIN HCL 1500 MG/300ML IV SOLN
1500.0000 mg | Freq: Once | INTRAVENOUS | Status: AC
  Administered 2021-03-16: 1500 mg via INTRAVENOUS
  Filled 2021-03-16: qty 300

## 2021-03-16 MED ORDER — ACETAMINOPHEN 325 MG PO TABS
650.0000 mg | ORAL_TABLET | Freq: Four times a day (QID) | ORAL | Status: DC
  Administered 2021-03-16 – 2021-03-21 (×21): 650 mg via ORAL
  Filled 2021-03-16 (×21): qty 2

## 2021-03-16 MED ORDER — SODIUM CHLORIDE 0.9 % IV SOLN: INTRAVENOUS | Status: DC

## 2021-03-16 MED ORDER — SODIUM CHLORIDE 0.9 % IV SOLN
2.0000 g | Freq: Two times a day (BID) | INTRAVENOUS | Status: DC
  Administered 2021-03-16 – 2021-03-21 (×10): 2 g via INTRAVENOUS
  Filled 2021-03-16 (×10): qty 2

## 2021-03-16 MED ORDER — ADULT MULTIVITAMIN W/MINERALS CH
1.0000 | ORAL_TABLET | Freq: Every day | ORAL | Status: DC
  Administered 2021-03-16 – 2021-03-21 (×6): 1 via ORAL
  Filled 2021-03-16 (×6): qty 1

## 2021-03-16 MED ORDER — LIDOCAINE 5 % EX PTCH
1.0000 | MEDICATED_PATCH | CUTANEOUS | Status: DC
  Administered 2021-03-16 – 2021-03-21 (×6): 1 via TRANSDERMAL
  Filled 2021-03-16 (×6): qty 1

## 2021-03-16 MED ORDER — ENSURE ENLIVE PO LIQD
237.0000 mL | Freq: Two times a day (BID) | ORAL | Status: DC
  Administered 2021-03-17 – 2021-03-18 (×4): 237 mL via ORAL

## 2021-03-16 MED ORDER — ONDANSETRON HCL 4 MG/2ML IJ SOLN: 4.0000 mg | Freq: Four times a day (QID) | INTRAMUSCULAR | Status: DC | PRN

## 2021-03-16 MED ORDER — SODIUM CHLORIDE 0.9 % IV SOLN
INTRAVENOUS | Status: DC
Start: 2021-03-16 — End: 2021-03-16

## 2021-03-16 MED ORDER — VANCOMYCIN HCL IN DEXTROSE 1-5 GM/200ML-% IV SOLN
1000.0000 mg | INTRAVENOUS | Status: DC
  Administered 2021-03-18: 1000 mg via INTRAVENOUS
  Filled 2021-03-16 (×2): qty 200

## 2021-03-16 MED ORDER — TRAMADOL HCL 50 MG PO TABS
50.0000 mg | ORAL_TABLET | Freq: Four times a day (QID) | ORAL | Status: DC | PRN
  Administered 2021-03-16 – 2021-03-21 (×7): 50 mg via ORAL
  Filled 2021-03-16 (×8): qty 1

## 2021-03-16 NOTE — Progress Notes (Addendum)
Nutrition Follow-up  DOCUMENTATION CODES:   Not applicable  INTERVENTION:    Encourage good PO intake Multivitamin w/ minerals daily Ensure Enlive po BID, each supplement provides 350 kcal and 20 grams of protein  NUTRITION DIAGNOSIS:   Inadequate oral intake related to inability to eat as evidenced by NPO status. - Progressing, pt now on a diet  GOAL:   Patient will meet greater than or equal to 90% of their needs - Progressing   MONITOR:   PO intake, Supplement acceptance  REASON FOR ASSESSMENT:   Consult New TPN/TNA  ASSESSMENT:   80 y.o. female presented to the ED with left chest pain; pt recently admitted 11/12 w/ large hiatal hernia, gastric volvulus, and an obstruction. PMH includes CHF, HTN, and GERD. Pt admitted with gastric volvulus versus early obstruction.    12/13 - Hernia repair & PEG placement 12/14 - diet advanced to CLD; diet advanced to SOFT diet   Pt reports that she is doing well, thinks that she is eating ok. Denies any nausea, vomiting, or feeling of fullness.  Per EMR, pt intake includes 50% lunch and dinner on 12/14.  Pt agreeable to adding Ensure since doing Boost at home.   PEG was placed to anchor the stomach, no plans for enteral nutrition.   RD made pt w/ assist for ordering meals.   Medications reviewed and include: Colace, Protonix, IV antibiotics  Labs reviewed.   Diet Order:   Diet Order             DIET SOFT Room service appropriate? Yes; Fluid consistency: Thin  Diet effective 0500                   EDUCATION NEEDS:   No education needs have been identified at this time  Skin:  Skin Assessment: Reviewed RN Assessment  Last BM:  03/14/2021  Height:   Ht Readings from Last 1 Encounters:  03/09/21 5\' 2"  (1.575 m)    Weight:   Wt Readings from Last 1 Encounters:  03/13/21 70.2 kg    Ideal Body Weight:  50 kg  BMI:  Body mass index is 28.31 kg/m.  Estimated Nutritional Needs:   Kcal:   1750-1950  Protein:  85-100 grams  Fluid:  > 1.8 L    Nour Scalise 14/12/22, RD, LDN Clinical Dietitian See [facility] for contact information.

## 2021-03-16 NOTE — TOC Initial Note (Signed)
Transition of Care [facility]) - Initial/Assessment Note    Patient Details  Name: Keyondra Lagrand MRN: [id number] Date of Birth: February 28, 1941  Transition of Care [facility]) CM/SW Contact:    Emeterio Reeve, LCSW Phone Number: 03/16/2021, 4:27 PM  Clinical Narrative:                  CSW received SNF consult. CSW met with son outside of room. Pt was getting cleaned up. CSW introduced self and explained role at the hospital. Pt reports that PTA pt lived at home with son. Pt was independent with mobility and ADL's.   CSW reviewed PT/OT recommendations for SNF. Son states that pt will  need SNF. They have no preference at this time.   Pt gave CSW permission to fax out to facilities in the area. CSW gave pt medicare.gov rating list to review. CSW explained insurance auth process.  CSW will continue to follow.   Expected Discharge Plan: Skilled Nursing Facility Barriers to Discharge: Insurance Authorization, Continued Medical Work up   Patient Goals and CMS Choice Patient states their goals for this hospitalization and ongoing recovery are:: to get better CMS Medicare.gov Compare Post Acute Care list provided to:: Patient Choice offered to / list presented to : Patient  Expected Discharge Plan and Services Expected Discharge Plan: [place] arrangements for the past 2 months: [place]                                      Prior Living Arrangements/Services Living arrangements for the past 2 months: [place] Lives with:: Adult Children Patient language and need for interpreter reviewed:: Yes Do you feel safe going back to the place where you live?: Yes      Need for Family Participation in Patient Care: Yes (Comment) Care giver support system in place?: Yes (comment)   Criminal Activity/Legal Involvement Pertinent to Current Situation/Hospitalization: No - Comment as needed  Activities of Daily Living      Permission  Sought/Granted Permission sought to share information with : Facility Sport and exercise psychologist, Family Supports Permission granted to share information with : Yes, Verbal Permission Granted     Permission granted to share info w AGENCY: SNF        Emotional Assessment Appearance:: Appears stated age Attitude/Demeanor/Rapport: Unable to Assess Affect (typically observed): Unable to Assess Orientation: : Oriented to Self, Oriented to Place, Oriented to  Time, Oriented to Situation Alcohol / Substance Use: Not Applicable Psych Involvement: No (comment)  Admission diagnosis:  Volvulus ([place]) [K56.2] 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] Hiatal hernia [K44.9] Chest pain, unspecified type [R07.9] Patient Active Problem List   Diagnosis Date Noted   MVP (mitral valve prolapse) 03/02/2021   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   Large hiatal hernia 02/12/2021   A-fib ([place]) 02/12/2021   Sick sinus syndrome ([place]) 02/12/2021   CHF (congestive heart failure) ([place]) 02/12/2021   Hypokalemia    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/2018   On continuous oral anticoagulation 01/10/2018   Insomnia 12/16/2017   Memory changes 12/06/2017   On amiodarone therapy 12/06/2017   History of cardioversion 11/22/2017   Cardiomyopathy, idiopathic ([place]) 11/21/2017   Acute HFrEF (heart failure with reduced ejection fraction) ([place]) 11/19/2017   Acquired hypothyroidism 04/21/2015   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 [place] [address] Phone: [phone] Fax: [phone]     Social Determinants of Health (SDOH) Interventions    Readmission Risk Interventions No flowsheet data found.  Emeterio Reeve,  LCSW Clinical Social Worker

## 2021-03-16 NOTE — Progress Notes (Signed)
PROGRESS NOTE    Danielle RoughBetty Blanchard  ZOX:[id number] DOB: [birth year] DOA: 03/09/2021 PCP: [facility]   Brief Narrative: 80 year old with past medical history significant for sick sinus syndrome post PPM, paroxysmal A. 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  She presented with left side chest pain 12/09, CT chest abdomen and pelvis showed large hiatal hernia with gastric body and antrum intrathoracic in location and air-fluid levels in the abdomen, unchanged from previous exam.   Patient was evaluated by GI who recommended surgical consultation no need for repeat endoscopy.  She was evaluated by surgery, he was treated conservatively with bowel rest and IV fluids.  Surgery recommended robotic XI taken down of the stomach and sac from the mediastinum with endoscopic PEG placed by Dr. Bedelia PersonLovick on 03/15/2021.    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 : -Patient recently discharged from the same month. -She was treated conservatively with bowel rest and IV fluids her symptoms improve. -Surgery evaluated patient and proceeded with robotic Xi taken down of the stomach and sac from the mediastinum with endoscopic PEG placed by Dr. Bedelia PersonLovick on 03/15/2021. -Diet advanced to Soft.  -Further management per surgery -Abdominal binder in place.    2-Paroxysmal A. fib: Coreg and amiodarone On Heparin Gtt.  Transition to oral anticoagulation when ok by sx.    3-Leukocytosis: PNA Chest x ray ; Moderate Right basilar opacity.  Start IV antibiotics; Vancomycin and Cefepime.  Incentive spirometry   -HTN: Continue with Coreg.  -Sinus syndrome status post pacemaker  -Chronic Diastolic heart failure Monitor volume status.   -Hypokalemia: Resolved.   Mild transaminases; monitor.   Nutrition Problem: Inadequate oral intake Etiology: inability to eat    Signs/Symptoms: NPO status    Interventions: TPN  Estimated body mass index is 28.31 kg/m as calculated from the following:   Height as of this encounter: 5\' 2"  (1.575 m).   Weight as of this encounter: 70.2 kg.   DVT prophylaxis: Heparin Gtt Code Status: Discussed with Son, patient wouldn't want to be resuscitated. Change her code status to DNR Family Communication: Disposition Plan:  Status is: Inpatient  Remains inpatient appropriate because: gastric volvulus post sx.         Consultants:  Surgery GI  Procedures:    Antimicrobials:    Subjective: She is sleepy, wake up to voice. Denies cough, denies worsening dyspnea.  Report mild abdominal pain.   Objective: Vitals:   03/15/21 1548 03/15/21 2029 03/16/21 0512 03/16/21 0843  BP: (!) 155/79 (!) 158/68 (!) 133/56 (!) 143/57  Pulse: (!) 59 60 60 61  Resp: 19 18 18 18   Temp: 97.9 F (36.6 C) 97.7 F (36.5 C) 98.1 F (36.7 C) 98.9 F (37.2 C)  TempSrc: Oral Oral Oral Oral  SpO2: 97% 97% 99% 97%  Weight:      Height:        Intake/Output Summary (Last 24 hours) at 03/16/2021 1132 Last data filed at 03/16/2021 0758 Gross per 24 hour  Intake  752.92 ml  Output 550 ml  Net 202.92 ml    Filed Weights   03/09/21 2224 03/13/21 1123  Weight: 71.8 kg 70.2 kg    Examination:  General exam: NAD, sleepy  Respiratory system: CTA Cardiovascular system: S 1, S  2RRR Gastrointestinal system:BS presents, G tube, binder in place Central nervous system: alert Extremities: no edema    Data Reviewed:  I have personally reviewed following labs and imaging studies  CBC: Recent Labs  Lab 03/09/21 2223 03/11/21 0044 03/12/21 0053 03/13/21 0053 03/14/21 0128 03/15/21 0422 03/16/21 0630  WBC 5.9   < > 5.8 5.2 5.6 10.9* 18.0*  NEUTROABS 3.8  --   --   --   --  10.0*  --   HGB 10.4*   < > 10.4* 9.2* 9.9* 11.1* 11.3*  HCT 31.9*   < > 30.9* 28.7* 30.6* 33.9* 35.5*  MCV 96.1   < > 93.6 96.0 96.2 95.8 97.3  PLT 189   < > 138* 142* 142* 168 174   < > = values in this interval not displayed.    Basic Metabolic Panel: Recent Labs  Lab 03/10/21 0401 03/11/21 0044 03/12/21 0053 03/13/21 0053 03/14/21 0128 03/15/21 0422 03/16/21 0630  NA 137 139 137 140 138 135 134*  K 2.8* 4.2 3.5 4.1 3.5 4.1 4.0  CL 95* 103 101 107 107 104 101  CO2 31 29 25 25 23 22 25   GLUCOSE 124* 78 85 92 87 161* 137*  BUN 19 8 7* 5* <5* 10 15  CREATININE 0.96 0.74 0.75 0.68 0.85 0.85 0.71  CALCIUM 8.9 8.7* 8.7* 8.8* 8.6* 8.6* 8.9  MG 2.1 1.9 1.8 2.0  --  1.9  --   PHOS  --   --   --  3.2  --  4.5  --     GFR: Estimated Creatinine Clearance: 49.7 mL/min (by C-G formula based on SCr of 0.71 mg/dL). Liver Function Tests: Recent Labs  Lab 03/12/21 0053 03/13/21 0053 03/14/21 0128 03/15/21 0422 03/16/21 0630  AST 38 35 36 56* 58*  ALT 41 36 35 45* 51*  ALKPHOS 68 65 65 91 110  BILITOT 0.6 0.7 0.8 1.1 1.5*  PROT 5.5* 5.6* 5.5* 6.3* 6.0*  ALBUMIN 3.2* 3.0* 2.9* 3.0* 3.1*    No results for input(s): LIPASE, AMYLASE in the last 168 hours. No results for input(s): AMMONIA in the last 168 hours. Coagulation Profile: No results for input(s): INR, PROTIME in the last 168 hours. Cardiac Enzymes: No results for input(s): CKTOTAL, CKMB, CKMBINDEX, TROPONINI in the last 168 hours. BNP (last 3 results) No results for input(s): PROBNP in the last 8760 hours. HbA1C: No results for input(s): HGBA1C in the last 72 hours. CBG: No results for input(s): GLUCAP in the last 168 hours. Lipid Profile: No results  for input(s): CHOL, HDL, LDLCALC, TRIG, CHOLHDL, LDLDIRECT in the last 72 hours. Thyroid Function Tests: No results for input(s): TSH, T4TOTAL, FREET4, T3FREE, THYROIDAB in the last 72 hours. Anemia Panel: No results for input(s): VITAMINB12, FOLATE, FERRITIN, TIBC, IRON, RETICCTPCT in the last 72 hours. Sepsis Labs: Recent Labs  Lab 03/15/21 0422  LATICACIDVEN 0.9     Recent Results (from the past 240 hour(s))  Resp Panel by RT-PCR (Flu A&B, Covid) Nasopharyngeal Swab     Status: None   Collection Time: 03/10/21  1:56 AM   Specimen: Nasopharyngeal Swab; Nasopharyngeal(NP) swabs in vial transport medium  Result Value Ref Range Status   SARS Coronavirus 2  by RT PCR NEGATIVE NEGATIVE Final    Comment: (NOTE) SARS-CoV-2 target nucleic acids are NOT DETECTED.  The SARS-CoV-2 RNA is generally detectable in upper respiratory specimens during the acute phase of infection. The lowest concentration of SARS-CoV-2 viral copies this assay can detect is 138 copies/mL. A negative result does not preclude SARS-Cov-2 infection and should not be used as the sole basis for treatment or other patient management decisions. A negative result may occur with  improper specimen collection/handling, submission of specimen other than nasopharyngeal swab, presence of viral mutation(s) within the areas targeted by this assay, and inadequate number of viral copies(<138 copies/mL). A negative result must be combined with clinical observations, patient history, and epidemiological information. The expected result is Negative.  Fact Sheet for Patients:  EntrepreneurPulse.com.au  Fact Sheet for Healthcare Providers:  IncredibleEmployment.be  This test is no t yet approved or cleared by the [place] FDA and  has been authorized for detection and/or diagnosis of SARS-CoV-2 by FDA under an Emergency Use Authorization (EUA). This EUA will remain  in effect (meaning this  test can be used) for the duration of the COVID-19 declaration under Section 564(b)(1) of the Act, 21 U.S.C.section 360bbb-3(b)(1), unless the authorization is terminated  or revoked sooner.       Influenza A by PCR NEGATIVE NEGATIVE Final   Influenza B by PCR NEGATIVE NEGATIVE Final    Comment: (NOTE) The Xpert Xpress SARS-CoV-2/FLU/RSV plus assay is intended as an aid in the diagnosis of influenza from Nasopharyngeal swab specimens and should not be used as a sole basis for treatment. Nasal washings and aspirates are unacceptable for Xpert Xpress SARS-CoV-2/FLU/RSV testing.  Fact Sheet for Patients: EntrepreneurPulse.com.au  Fact Sheet for Healthcare Providers: IncredibleEmployment.be  This test is not yet approved or cleared by the [place] FDA and has been authorized for detection and/or diagnosis of SARS-CoV-2 by FDA under an Emergency Use Authorization (EUA). This EUA will remain in effect (meaning this test can be used) for the duration of the COVID-19 declaration under Section 564(b)(1) of the Act, 21 U.S.C. section 360bbb-3(b)(1), unless the authorization is terminated or revoked.  Performed at [facility], [place] [address] [place], [place] [address]   Surgical pcr screen     Status: Abnormal   Collection Time: 03/11/21  6:12 AM   Specimen: Nasal Mucosa; Nasal Swab  Result Value Ref Range Status   MRSA, PCR NEGATIVE NEGATIVE Final   Staphylococcus aureus POSITIVE (A) NEGATIVE Final    Comment: (NOTE) The Xpert SA Assay (FDA approved for NASAL specimens in patients 59 years of age and older), is one component of a comprehensive surveillance program. It is not intended to diagnose infection nor to guide or monitor treatment. Performed at [facility], [place] [address], [place], [place] [address]           Radiology Studies: DG Chest 2 View  Result Date: 03/16/2021 CLINICAL DATA:  Leukocytosis. EXAM:  CHEST - 2 VIEW COMPARISON:  March 09, 2021. FINDINGS: Stable cardiomegaly. Left-sided pacemaker is unchanged in position. No pneumothorax is noted. Large hiatal hernia is again noted. Moderate right basilar opacity is noted concerning for atelectasis with associated pleural effusion. Mild left basilar subsegmental atelectasis is noted. Bony thorax is unremarkable. IMPRESSION: Large hiatal hernia is noted. Moderate right basilar opacity is now noted concerning for atelectasis with associated pleural effusion. Electronically Signed   By: Marijo Conception M.D.   On: 03/16/2021 10:22  Scheduled Meds:  acetaminophen  650 mg Oral Q6H   amiodarone  200 mg Oral Daily   carvedilol  3.125 mg Oral BID WC   Chlorhexidine Gluconate Cloth  6 each Topical Daily   docusate sodium  100 mg Oral BID   levothyroxine  75 mcg Oral Q0600   lidocaine  1 patch Transdermal Q24H   melatonin  3 mg Oral QHS   mupirocin ointment  1 application Nasal BID   pantoprazole  40 mg Oral Daily   rosuvastatin  20 mg Oral Daily   Continuous Infusions:  ceFEPime (MAXIPIME) IV     heparin 1,000 Units/hr (03/15/21 2055)   [START ON 03/17/2021] vancomycin     vancomycin       LOS: 6 days    Time spent: 35 minutes    Amardeep Beckers A Peterson Mathey, MD [facility]   If 7PM-7AM, please contact night-coverage [email or web address]  03/16/2021, 11:32 AM

## 2021-03-16 NOTE — Progress Notes (Signed)
ANTICOAGULATION CONSULT NOTE  Pharmacy Consult for heparin Indication: atrial fibrillation  No Known Allergies  Patient Measurements: Height: 5\' 2"  (157.5 cm) Weight: 70.2 kg (154 lb 12.8 oz) IBW/kg (Calculated) : 50.1 Heparin Dosing Weight: 65.4 kg  Vital Signs: Temp: 98.1 F (36.7 C) (12/15 0512) Temp Source: Oral (12/15 0512) BP: 133/56 (12/15 0512) Pulse Rate: 60 (12/15 0512)  Labs: Recent Labs    03/14/21 0128 03/15/21 0422 03/15/21 1811 03/16/21 0603  HGB 9.9* 11.1*  --   --   HCT 30.6* 33.9*  --   --   PLT 142* 168  --   --   APTT 76*  --  36 57*  HEPARINUNFRC 0.48  --  0.13* 0.62  CREATININE 0.85 0.85  --   --      Estimated Creatinine Clearance: 46.8 mL/min (by C-G formula based on SCr of 0.85 mg/dL).   Assessment:  80 yo F with pAF (PTA eliquis), CHF, HTN, HLD and GERD. Pt with N/V bc of recurrent gastric volvulus with partial obstruction. GI and surgery following with plans for surgical reduction and repair of hernia. Holding PTA eliquis, last dose 12/8 AM - heparin gtt in the meantime.  -heparin level= 0.62 on 1000 units/hr -aPTT= 57    Heparin level is likely more accurate  Goal of Therapy:  Heparin level 0.3-0.7 units/ml aPTT 66-102 seconds Monitor platelets by anticoagulation protocol: Yes   Plan:  -Continue heparin at 1000 units/hr -Daily heparin level and CBC -D/c daily aPTT  14/8, PharmD Clinical Pharmacist **Pharmacist phone directory can now be found on [email or web address] ([email or web address]).  Listed under [facility].

## 2021-03-16 NOTE — Progress Notes (Signed)
Pharmacy Antibiotic Note  Danielle Blanchard is a 80 y.o. female admitted on 03/09/2021 with pneumonia.  Pharmacy has been consulted for Cefepime and Vancomycin dosing.  Significant rise in WBC today at 18. Afebrile. SCr stable. LA on 12/14 was within normal limits.   CXR 12/15 AM read with moderate right basilar opacity is now noted concerning for atelectasis with associated pleural effusion  Plan: Cefepime 2g IV every 12 hours. Vancomycin 1500 mg IV x1 then 1000 mg IV every 24 hours. eAUC ~495 using SCr 0.8  Monitor renal function closely and adjust as needed Will repeat MRSA PCR to help guide Vancomycin need  Height: 5\' 2"  (157.5 cm) Weight: 70.2 kg (154 lb 12.8 oz) IBW/kg (Calculated) : 50.1  Temp (24hrs), Avg:98.2 F (36.8 C), Min:97.7 F (36.5 C), Max:98.9 F (37.2 C)  Recent Labs  Lab 03/12/21 0053 03/13/21 0053 03/14/21 0128 03/15/21 0422 03/16/21 0630  WBC 5.8 5.2 5.6 10.9* 18.0*  CREATININE 0.75 0.68 0.85 0.85 0.71  LATICACIDVEN  --   --   --  0.9  --     Estimated Creatinine Clearance: 49.7 mL/min (by C-G formula based on SCr of 0.71 mg/dL).    No Known Allergies  Antimicrobials this admission: Cefepime 12/15 >> Vancomycin 12/15 >>  Dose adjustments this admission:   Microbiology results: 12/15 UCx:  12/10 MRSA PCR: negative  Thank you for allowing pharmacy to be a part of this patients care.  14/10, PharmD, BCPS, BCCCP Clinical Pharmacist Please refer to [facility] for [facility] Pharmacy numbers 03/16/2021 10:37 AM

## 2021-03-16 NOTE — Progress Notes (Signed)
° ° °  Subjective:  Post robotic volvulus /hernia repair   Objective:  Vitals:   03/15/21 0745 03/15/21 1548 03/15/21 2029 03/16/21 0512  BP: (!) 155/65 (!) 155/79 (!) 158/68 (!) 133/56  Pulse: (!) 59 (!) 59 60 60  Resp: 18 19 18 18   Temp: (!) 97.3 F (36.3 C) 97.9 F (36.6 C) 97.7 F (36.5 C) 98.1 F (36.7 C)  TempSrc: Oral Oral Oral Oral  SpO2: 96% 97% 97% 99%  Weight:      Height:        Intake/Output from previous day:  Intake/Output Summary (Last 24 hours) at 03/16/2021 0815 Last data filed at 03/16/2021 0758 Gross per 24 hour  Intake 1112.92 ml  Output 550 ml  Net 562.92 ml    Physical Exam: Sitting in chair comfortable Lungs clear PPM under left clavicle SEM/AR murmur  Post surgery with gastric PEG tube  Trace edema Palpable pedal pulses   Lab Results: Basic Metabolic Panel: Recent Labs    03/15/21 0422 03/16/21 0630  NA 135 134*  K 4.1 4.0  CL 104 101  CO2 22 25  GLUCOSE 161* 137*  BUN 10 15  CREATININE 0.85 0.71  CALCIUM 8.6* 8.9  MG 1.9  --   PHOS 4.5  --    Liver Function Tests: Recent Labs    03/15/21 0422 03/16/21 0630  AST 56* 58*  ALT 45* 51*  ALKPHOS 91 110  BILITOT 1.1 1.5*  PROT 6.3* 6.0*  ALBUMIN 3.0* 3.1*   No results for input(s): LIPASE, AMYLASE in the last 72 hours. CBC: Recent Labs    03/15/21 0422 03/16/21 0630  WBC 10.9* 18.0*  NEUTROABS 10.0*  --   HGB 11.1* 11.3*  HCT 33.9* 35.5*  MCV 95.8 97.3  PLT 168 174     Imaging: No results found.  Cardiac Studies:  ECG: AV pacing    Telemetry: AV pacing no PAF   Echo: 03/10/21 EF 60-65% AV sclerosis mild/mod AR   Medications:    acetaminophen  650 mg Oral Q6H   amiodarone  200 mg Oral Daily   carvedilol  3.125 mg Oral BID WC   Chlorhexidine Gluconate Cloth  6 each Topical Daily   docusate sodium  100 mg Oral BID   levothyroxine  75 mcg Oral Q0600   lidocaine  1 patch Transdermal Q24H   melatonin  3 mg Oral QHS   mupirocin ointment  1 application  Nasal BID   pantoprazole  40 mg Oral Daily   rosuvastatin  20 mg Oral Daily      heparin 1,000 Units/hr (03/15/21 2055)    Assessment/Plan:   PPM:  normal function  PAF  In sinus with AV pacing not taking PO being covered with iv heparin until able to resume No rush rather large GI surgery and she is in 2056 03/16/2021, 8:15 AM

## 2021-03-16 NOTE — Progress Notes (Signed)
Progress Note  2 Days Post-Op  Subjective: Pt reports abdomen is sore this AM. Pt unsure if she got out of bed yesterday. Per RN foley was removed this AM and she was told in report that patient did not get up much yesterday. Patient is A&Ox4 this AM but was unsure of when she had surgery.   Objective: Vital signs in last 24 hours: Temp:  [97.7 F (36.5 C)-98.1 F (36.7 C)] 98.1 F (36.7 C) (12/15 0512) Pulse Rate:  [59-60] 60 (12/15 0512) Resp:  [18-19] 18 (12/15 0512) BP: (133-158)/(56-79) 133/56 (12/15 0512) SpO2:  [97 %-99 %] 99 % (12/15 0512) Last BM Date: 03/14/21  Intake/Output from previous day: 12/14 0701 - 12/15 0700 In: 969.2 [P.O.:960; I.V.:9.2] Out: 550 [Urine:550] Intake/Output this shift: Total I/O In: 143.8 [I.V.:143.8] Out: -   PE: General: pleasant, WD, elderly female who is laying in bed in NAD Heart: regular, rate, and rhythm.   Lungs:  Respiratory effort nonlabored Abd: soft, appropriately ttp, ND, BS hypoactive, incisions c/d/I, PEG in place and clamped, binder applied this AM with RN  MS: all 4 extremities are symmetrical with no cyanosis, clubbing, or edema. Skin: warm and dry with no masses, lesions, or rashes Psych: A&Ox4 with some mild confusion regarding date of surgery    Lab Results:  Recent Labs    03/15/21 0422 03/16/21 0630  WBC 10.9* 18.0*  HGB 11.1* 11.3*  HCT 33.9* 35.5*  PLT 168 174   BMET Recent Labs    03/15/21 0422 03/16/21 0630  NA 135 134*  K 4.1 4.0  CL 104 101  CO2 22 25  GLUCOSE 161* 137*  BUN 10 15  CREATININE 0.85 0.71  CALCIUM 8.6* 8.9   PT/INR No results for input(s): LABPROT, INR in the last 72 hours. CMP     Component Value Date/Time   NA 134 (L) 03/16/2021 0630   K 4.0 03/16/2021 0630   CL 101 03/16/2021 0630   CO2 25 03/16/2021 0630   GLUCOSE 137 (H) 03/16/2021 0630   BUN 15 03/16/2021 0630   CREATININE 0.71 03/16/2021 0630   CALCIUM 8.9 03/16/2021 0630   PROT 6.0 (L) 03/16/2021 0630    ALBUMIN 3.1 (L) 03/16/2021 0630   AST 58 (H) 03/16/2021 0630   ALT 51 (H) 03/16/2021 0630   ALKPHOS 110 03/16/2021 0630   BILITOT 1.5 (H) 03/16/2021 0630   GFRNONAA >60 03/16/2021 0630   GFRAA >60 12/05/2019 2254   Lipase     Component Value Date/Time   LIPASE 42 02/11/2021 2017       Studies/Results: No results found.  Anti-infectives: Anti-infectives (From admission, onward)    Start     Dose/Rate Route Frequency Ordered Stop   03/14/21 0600  ceFAZolin (ANCEF) IVPB 2g/100 mL premix        2 g 200 mL/hr over 30 Minutes Intravenous On call to O.R. 03/13/21 1210 03/14/21 1023        Assessment/Plan Hiatal hernia  POD2 S/P robotic hiatal hernia repair with PEG placement 12/13 Dr. Daphine Deutscher - continue G-tube clamped - abdominal binder applied at bedside with RN this AM, continue this to protect PEG - ok to advance to soft diet today  - scheduled tylenol and lidocaine patch for pain control, added tramadol q6h prn to help pain control as well - continue to mobilize - UOP low still, may give NS bolus slowly pending results of CXR Leukocytosis - WBC up to 18 this AM from 10 yesterday, CXR  ordered by TRH, check UA as well   FEN: soft diet  VTE: heparin gtt ID: ancef pre-op Foley: removed this AM  Sick sinus syndrome s/p pacemaker Paroxysmal A. Fib on eliquis - eliquis on hold Chronic diastolic CHF - EF is 60-65% on echo 12/9 HTN HLD GERD  LOS: 6 days    Juliet Rude, [facility] 03/16/2021, 8:13 AM Please see Amion for pager number during day hours 7:00am-4:30pm

## 2021-03-17 LAB — COMPREHENSIVE METABOLIC PANEL
ALT: 58 U/L — ABNORMAL HIGH (ref 0–44)
AST: 66 U/L — ABNORMAL HIGH (ref 15–41)
Albumin: 2.5 g/dL — ABNORMAL LOW (ref 3.5–5.0)
Alkaline Phosphatase: 105 U/L (ref 38–126)
Anion gap: 5 (ref 5–15)
BUN: 16 mg/dL (ref 8–23)
CO2: 24 mmol/L (ref 22–32)
Calcium: 8.2 mg/dL — ABNORMAL LOW (ref 8.9–10.3)
Chloride: 105 mmol/L (ref 98–111)
Creatinine, Ser: 0.77 mg/dL (ref 0.44–1.00)
GFR, Estimated: >60 mL/min (ref >60)
Glucose, Bld: 105 mg/dL — ABNORMAL HIGH (ref 70–99)
Potassium: 3.7 mmol/L (ref 3.5–5.1)
Sodium: 134 mmol/L — ABNORMAL LOW (ref 135–145)
Total Bilirubin: 1.1 mg/dL (ref 0.3–1.2)
Total Protein: 5 g/dL — ABNORMAL LOW (ref 6.5–8.1)

## 2021-03-17 LAB — CBC
HCT: 27.9 % — ABNORMAL LOW (ref 36.0–46.0)
Hemoglobin: 8.8 g/dL — ABNORMAL LOW (ref 12.0–15.0)
MCH: 30.8 pg (ref 26.0–34.0)
MCHC: 31.5 g/dL (ref 30.0–36.0)
MCV: 97.6 fL (ref 80.0–100.0)
Platelets: 134 10*3/uL — ABNORMAL LOW (ref 150–400)
RBC: 2.86 MIL/uL — ABNORMAL LOW (ref 3.87–5.11)
RDW: 16.6 % — ABNORMAL HIGH (ref 11.5–15.5)
WBC: 9.4 10*3/uL (ref 4.0–10.5)
nRBC: 0 % (ref 0.0–0.2)

## 2021-03-17 LAB — HEPARIN LEVEL (UNFRACTIONATED): Heparin Unfractionated: 0.36 IU/mL (ref 0.30–0.70)

## 2021-03-17 MED ORDER — STERILE WATER FOR INJECTION IJ SOLN: 30.0000 mL | Freq: Every day | INTRAMUSCULAR | Status: DC

## 2021-03-17 MED ORDER — POLYETHYLENE GLYCOL 3350 17 G PO PACK
17.0000 g | PACK | Freq: Every day | ORAL | Status: DC
  Administered 2021-03-17 – 2021-03-21 (×5): 17 g via ORAL
  Filled 2021-03-17 (×5): qty 1

## 2021-03-17 MED ORDER — FREE WATER
30.0000 mL | Freq: Every day | Status: DC
  Administered 2021-03-17 – 2021-03-21 (×5): 30 mL

## 2021-03-17 NOTE — Progress Notes (Signed)
Occupational Therapy Treatment Patient Details Name: Danielle Blanchard MRN: [id number] DOB: 06-18-1940 Today's Date: 03/17/2021   History of present illness 80 y.o female with medical history significant of sick sinus syndrome post PPM, chronic diastolic CHF, hypertension, hyperlipidemia, GERD.  Recently admitted on 11/12 for left upper quadrant abdominal pain.  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  Patient was seen by GI and underwent decompression by endoscopy on 11/14.  She returns to the ED with left-sided chest pain.  Chest x-ray showing stable large hiatal hernia.  CT chest/abdomen/pelvis showing large hiatal hernia.  Pt underwent G-tube placement on 12/13.   OT comments  Pt. Seen for skilled OT treatment session.  Encouragement to participate secondary to fear of pain but able to complete desired tasks once engaged.  Completed toileting on bsc and able to perform portion of her peri care in standing.  Short distance ambulation to recliner.  Current d/c recommendations remain appropriate.     Recommendations for follow up therapy are one component of a multi-disciplinary discharge planning process, led by the attending physician.  Recommendations may be updated based on patient status, additional functional criteria and insurance authorization.    Follow Up Recommendations  Skilled nursing-short term rehab (<3 hours/day)    Assistance Recommended at Discharge Intermittent Supervision/Assistance  Equipment Recommendations  None recommended by OT;Other (comment)    Recommendations for Other Services      Precautions / Restrictions Precautions Precautions: Fall Precaution Comments: abdominal pain       Mobility Bed Mobility               General bed mobility comments: seated on bsc upon my arrival to room    Transfers Overall transfer level: Needs assistance Equipment used: Rolling walker (2 wheels) Transfers: Sit to/from Stand;Bed  to chair/wheelchair/BSC Sit to Stand: Min assist Stand pivot transfers: Min assist               Balance                                           ADL either performed or assessed with clinical judgement   ADL Overall ADL's : Needs assistance/impaired                         Toilet Transfer: Minimal assistance;Ambulation;Rolling walker (2 wheels);BSC/3in1 Toilet Transfer Details (indicate cue type and reason): seated on bsc upon arrival but min a bsc to recliner Toileting- Clothing Manipulation and Hygiene: Sit to/from stand;Min guard;Maximal assistance Toileting - Clothing Manipulation Details (indicate cue type and reason): pt. able to perform cleaning of front peri area with min guard, max for buttocks     Functional mobility during ADLs: Minimal assistance;Cueing for sequencing;Cueing for safety General ADL Comments: followed cues well and demonstrated good hand placement when transitioning into sitting    Extremity/Trunk Assessment              Vision       Perception     Praxis      Cognition Arousal/Alertness: Awake/alert Behavior During Therapy: WFL for tasks assessed/performed Overall Cognitive Status: History of cognitive impairments - at baseline  Exercises     Shoulder Instructions       General Comments      Pertinent Vitals/ Pain       Faces Pain Scale: Hurts little more Pain Location: abdomen Pain Descriptors / Indicators: Aching;Constant;Discomfort;Cramping;Grimacing Pain Intervention(s): Limited activity within patient's tolerance;Repositioned  Home Living                                          Prior Functioning/Environment              Frequency  Min 2X/week        Progress Toward Goals  OT Goals(current goals can now be found in the care plan section)  Progress towards OT goals: Progressing toward goals      Plan Discharge plan remains appropriate;Frequency remains appropriate    Co-evaluation                 AM-PAC OT "6 Clicks" Daily Activity     Outcome Measure   Help from another person eating meals?: None Help from another person taking care of personal grooming?: A Little Help from another person toileting, which includes using toliet, bedpan, or urinal?: A Lot Help from another person bathing (including washing, rinsing, drying)?: A Lot Help from another person to put on and taking off regular upper body clothing?: A Lot Help from another person to put on and taking off regular lower body clothing?: A Lot 6 Click Score: 15    End of Session Equipment Utilized During Treatment: Rolling walker (2 wheels)  OT Visit Diagnosis: Unsteadiness on feet (R26.81)   Activity Tolerance Patient tolerated treatment well   Patient Left in chair;with call bell/phone within reach;with nursing/sitter in room   Nurse Communication          Time: 3295-1884 OT Time Calculation (min): 8 min  Charges: OT General Charges $OT Visit: 1 Visit OT Treatments $Self Care/Home Management : 8-22 mins  Boneta Lucks, COTA/L Acute Rehabilitation [phone]   Salvadore Oxford 03/17/2021, 11:35 AM

## 2021-03-17 NOTE — NC FL2 (Signed)
[place] MEDICAID FL2 LEVEL OF CARE SCREENING TOOL     IDENTIFICATION  Patient Name: Danielle Blanchard Birthdate: 1941-03-28 Sex: female Admission Date (Current Location): 03/09/2021  [facility] and [place] Number:  Producer, television/film/video and Address:  [facility] [place], [address] [place], [place] [address]      Provider Number: [id number]  Attending Physician Name and Address:  Alba Cory, MD  Relative Name and Phone Number:  Lenell Antu [facility])   [phone] [facility] Phone)    Current Level of Care: Hospital Recommended Level of Care: Skilled Nursing Facility Prior Approval Number:    Date Approved/Denied:   PASRR Number: [id number]  Discharge Plan: SNF    Current Diagnoses: Patient Active Problem List   Diagnosis Date Noted   MVP (mitral valve prolapse) 03/02/2021   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   Large hiatal hernia 02/12/2021   A-fib (HCC) 02/12/2021   Sick sinus syndrome (HCC) 02/12/2021   CHF (congestive heart failure) (HCC) 02/12/2021   Hypokalemia    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/2018   On continuous oral anticoagulation 01/10/2018   Insomnia 12/16/2017   Memory changes 12/06/2017   On amiodarone therapy 12/06/2017   History of cardioversion 11/22/2017   Cardiomyopathy, idiopathic (HCC) 11/21/2017   Acute HFrEF (heart failure with reduced ejection fraction) (HCC) 11/19/2017   Acquired hypothyroidism 04/21/2015   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/21/2015   Iron deficiency anemia 04/21/2015   Osteopenia 04/21/2015   Essential hypertension 11/08/2010   Dyslipidemia 11/08/2010    Orientation RESPIRATION BLADDER Height & Weight     Self, Time, Situation, Place  O2 (2L [place]) External catheter Weight: 154 lb 12.8 oz (70.2 kg) Height:  5\' 2"  (157.5 cm)  BEHAVIORAL SYMPTOMS/MOOD NEUROLOGICAL BOWEL NUTRITION STATUS      Continent Diet, Feeding tube (PEG-tube)  AMBULATORY STATUS COMMUNICATION OF NEEDS Skin   Extensive Assist  Verbally Surgical wounds (Incision on abdomen for PEG TUBE placement)                       Personal Care Assistance Level of Assistance  Bathing, Feeding, Dressing Bathing Assistance: Limited assistance Feeding assistance: Independent Dressing Assistance: Limited assistance     Functional Limitations Info  Sight, Hearing, Speech Sight Info: Adequate Hearing Info: Adequate Speech Info: Adequate    SPECIAL CARE FACTORS FREQUENCY  PT (By licensed PT), OT (By licensed OT)     PT Frequency: 5x/week OT Frequency: 5x/week            Contractures Contractures Info: Not present    Additional Factors Info  Code Status, Allergies Code Status Info: DNR Allergies Info: No known allergies           Current Medications (03/17/2021):  This is the current hospital active medication list Current Facility-Administered Medications  Medication Dose Route Frequency Provider Last Rate Last Admin   0.9 %  sodium chloride infusion   Intravenous Continuous Regalado, Belkys A, MD 50 mL/hr at 03/17/21 1456 Rate Change at 03/17/21 1456   acetaminophen (TYLENOL) tablet 650 mg  650 mg Oral Q6H 03/19/21, PA-C   650 mg at 03/17/21 1454   alum & mag hydroxide-simeth (MAALOX/MYLANTA) 200-200-20 MG/5ML suspension 30 mL  30 mL Oral Q4H PRN Regalado, Belkys A, MD   30 mL at 03/15/21 2005   amiodarone (PACERONE) tablet 200 mg  200 mg Oral Daily 03/17/21, PA-C   200 mg at 03/17/21 0859   carvedilol (COREG) tablet 3.125  mg  3.125 mg Oral BID WC Trixie Deis R, PA-C   3.125 mg at 03/17/21 0859   ceFEPIme (MAXIPIME) 2 g in sodium chloride 0.9 % 100 mL IVPB  2 g Intravenous Q12H Quenton Fetter, RPH 200 mL/hr at 03/17/21 0902 2 g at 03/17/21 0902   Chlorhexidine Gluconate Cloth 2 % PADS 6 each  6 each Topical Daily Juliet Rude, PA-C   6 each at 03/17/21 1101   docusate sodium (COLACE) capsule 100 mg  100 mg Oral BID Juliet Rude, PA-C   100 mg at 03/17/21 8563   feeding  supplement (ENSURE ENLIVE / ENSURE PLUS) liquid 237 mL  237 mL Oral BID BM Regalado, Belkys A, MD   237 mL at 03/17/21 1456   free water 30 mL  30 mL Per Tube Daily Juliet Rude, PA-C   30 mL at 03/17/21 1101   heparin ADULT infusion 100 units/mL (25000 units/255mL)  1,000 Units/hr Intravenous Continuous von Fredricka Bonine B, RPH 10 mL/hr at 03/16/21 1905 1,000 Units/hr at 03/16/21 1905   hydrALAZINE (APRESOLINE) injection 10 mg  10 mg Intravenous Q6H PRN Juliet Rude, PA-C   10 mg at 03/14/21 2018   HYDROmorphone (DILAUDID) injection 0.5 mg  0.5 mg Intravenous Q3H PRN Eduard Clos, MD   0.5 mg at 03/16/21 1497   levothyroxine (SYNTHROID) tablet 75 mcg  75 mcg Oral Q0600 Juliet Rude, PA-C   75 mcg at 03/17/21 0510   lidocaine (LIDODERM) 5 % 1 patch  1 patch Transdermal Q24H Juliet Rude, PA-C   1 patch at 03/17/21 0900   melatonin tablet 3 mg  3 mg Oral QHS Juliet Rude, PA-C   3 mg at 03/16/21 2226   metoprolol tartrate (LOPRESSOR) injection 5 mg  5 mg Intravenous Q8H PRN Juliet Rude, PA-C       multivitamin with minerals tablet 1 tablet  1 tablet Oral Daily Regalado, Belkys A, MD   1 tablet at 03/17/21 0900   mupirocin ointment (BACTROBAN) 2 % 1 application  1 application Nasal BID Juliet Rude, PA-C   1 application at 03/17/21 0900   ondansetron (ZOFRAN) injection 4 mg  4 mg Intravenous Q6H PRN Juliet Rude, PA-C       pantoprazole (PROTONIX) EC tablet 40 mg  40 mg Oral Daily Juliet Rude, PA-C   40 mg at 03/17/21 0900   polyethylene glycol (MIRALAX / GLYCOLAX) packet 17 g  17 g Oral Daily Juliet Rude, PA-C   17 g at 03/17/21 1101   rosuvastatin (CRESTOR) tablet 20 mg  20 mg Oral Daily Juliet Rude, PA-C   20 mg at 03/17/21 0859   traMADol (ULTRAM) tablet 50 mg  50 mg Oral Q6H PRN Juliet Rude, PA-C   50 mg at 03/17/21 0859   vancomycin (VANCOCIN) IVPB 1000 mg/200 mL premix  1,000 mg Intravenous Q24H Quenton Fetter, [facility]          Discharge Medications: Please see discharge summary for a list of discharge medications.  Relevant Imaging Results:  Relevant Lab Results:   Additional Information SSN [id number] Pt is not vaccinated  [employer], LCSW

## 2021-03-17 NOTE — Progress Notes (Signed)
ANTICOAGULATION CONSULT NOTE  Pharmacy Consult for heparin Indication: atrial fibrillation  No Known Allergies  Patient Measurements: Height: 5\' 2"  (157.5 cm) Weight: 70.2 kg (154 lb 12.8 oz) IBW/kg (Calculated) : 50.1 Heparin Dosing Weight: 65.4 kg  Vital Signs: Temp: 98.1 F (36.7 C) (12/16 0804) Temp Source: Oral (12/16 0804) BP: 125/53 (12/16 0804) Pulse Rate: 59 (12/16 0804)  Labs: Recent Labs    03/15/21 0422 03/15/21 1811 03/15/21 1811 03/16/21 0603 03/16/21 0630 03/16/21 1416 03/17/21 0202  HGB 11.1*  --   --   --  11.3*  --  8.8*  HCT 33.9*  --   --   --  35.5*  --  27.9*  PLT 168  --   --   --  174  --  134*  APTT  --  36  --  57*  --   --   --   HEPARINUNFRC  --  0.13*   < > 0.62  --  0.68 0.36  CREATININE 0.85  --   --   --  0.71  --  0.77   < > = values in this interval not displayed.     Estimated Creatinine Clearance: 49.7 mL/min (by C-G formula based on SCr of 0.77 mg/dL).   Assessment:  80 yo F with pAF (PTA eliquis), CHF, HTN, HLD and GERD. Pt with N/V bc of recurrent gastric volvulus with partial obstruction. GI and surgery following with plans for surgical reduction and repair of hernia. Holding PTA eliquis, last dose 12/8 AM - heparin gtt in the meantime.   Heparin level 0.36 is therapeutic on 1000 units/hr. Unclear why HL dropped 0.68 to 0.36 on same rate, no holds on gtt charted or in notes.  No issues with infusion or bleeding per RN. Hgb and platelets also dropped with no signs of bleeding.    Goal of Therapy:  Heparin level 0.3-0.7 units/ml aPTT 66-102 seconds Monitor platelets by anticoagulation protocol: Yes   Plan:  Continue heparin at 1000 units/hr Monitor daily HL, CBC/plt Monitor for signs/symptoms of bleeding  F/u restart apixaban   14/8, PharmD, BCPS, [facility] Clinical Pharmacist  Please check AMION for all [facility] Pharmacy phone numbers After 10:00 PM, call Main Pharmacy [phone]

## 2021-03-17 NOTE — Progress Notes (Signed)
Physical Therapy Treatment Patient Details Name: Danielle Blanchard MRN: [id number] DOB: 1940-11-26 Today's Date: 03/17/2021   History of Present Illness 80 y.o female admitted 12/8  with left-sided chest pain. CT chest/abdomen/pelvis showing large hiatal hernia.  Pt underwent G-tube placement and hernia repair on 12/13.  PMH:  sick sinus syndrome post PPM, chronic diastolic CHF, hypertension, hyperlipidemia, GERD.  Recently admitted on 11/12 for left upper quadrant abdominal pain.  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  Patient was seen by GI and underwent decompression by endoscopy on 11/14.    PT Comments    Pt admitted with above diagnosis. Pt was able to ambulate in room but needing min assist and mod cues fro safety.  Continues to need incr care and SNF would benefit pt.  Pt currently with functional limitations due to balance and endurance deficits. Pt will benefit from skilled PT to increase their independence and safety with mobility to allow discharge to the venue listed below.      Recommendations for follow up therapy are one component of a multi-disciplinary discharge planning process, led by the attending physician.  Recommendations may be updated based on patient status, additional functional criteria and insurance authorization.  Follow Up Recommendations  Skilled nursing-short term rehab (<3 hours/day)     Assistance Recommended at Discharge Frequent or constant Supervision/Assistance  Equipment Recommendations   (TBD at next venue)    Recommendations for Other Services       Precautions / Restrictions Precautions Precautions: Fall Precaution Comments: abdominal pain Restrictions Weight Bearing Restrictions: No     Mobility  Bed Mobility               General bed mobility comments: in recliner on arrival    Transfers Overall transfer level: Needs assistance Equipment used: Rolling walker (2 wheels) Transfers: Sit  to/from Stand;Bed to chair/wheelchair/BSC Sit to Stand: Min assist Stand pivot transfers: Min assist         General transfer comment: increased time and cues for hand placement.  Difficulty for pt to stand upright even with cues    Ambulation/Gait Ambulation/Gait assistance: Min assist Gait Distance (Feet): 65 Feet Assistive device: Rolling walker (2 wheels);None Gait Pattern/deviations: Step-through pattern;Decreased stride length;Trunk flexed;Wide base of support;Antalgic Gait velocity: decreased Gait velocity interpretation: <1.31 ft/sec, indicative of household ambulator   General Gait Details: Pt needs constant cues to stand tall as she tends to flex and flexion of trunk increases as she fatigues. Needed assist and cues to steer RW as well.  Needed incr cues to square her body up to 3N1 and then recliner as well as pt ambulated to door and back to 3N1.  Needed assist to be cleaned and then pivoted to recliner.   Stairs             Wheelchair Mobility    Modified Rankin (Stroke Patients Only)       Balance Overall balance assessment: Needs assistance Sitting-balance support: Feet supported Sitting balance-Leahy Scale: Fair     Standing balance support: Bilateral upper extremity supported Standing balance-Leahy Scale: Poor Standing balance comment: dependent on RW and external support                            Cognition Arousal/Alertness: Awake/alert Behavior During Therapy: WFL for tasks assessed/performed Overall Cognitive Status: History of cognitive impairments - at baseline  Exercises General Exercises - Lower Extremity Ankle Circles/Pumps: AROM;Both;10 reps;Supine Long Arc Quad: AROM;Both;10 reps;Seated    General Comments General comments (skin integrity, edema, etc.): Pt O2 85% on RA therefore replaced her O2 at 2L      Pertinent Vitals/Pain Pain Assessment: Faces Faces  Pain Scale: Hurts little more Pain Location: abdomen Pain Descriptors / Indicators: Aching;Constant;Discomfort;Cramping;Grimacing Pain Intervention(s): Limited activity within patient's tolerance;Monitored during session;Repositioned    Home Living                          Prior Function            PT Goals (current goals can now be found in the care plan section) Progress towards PT goals: Progressing toward goals    Frequency    Min 3X/week      PT Plan Current plan remains appropriate    Co-evaluation              AM-PAC PT "6 Clicks" Mobility   Outcome Measure  Help needed turning from your back to your side while in a flat bed without using bedrails?: A Little Help needed moving from lying on your back to sitting on the side of a flat bed without using bedrails?: A Little Help needed moving to and from a bed to a chair (including a wheelchair)?: A Little Help needed standing up from a chair using your arms (e.g., wheelchair or bedside chair)?: A Lot Help needed to walk in hospital room?: A Lot Help needed climbing 3-5 steps with a railing? : Total 6 Click Score: 14    End of Session Equipment Utilized During Treatment: Gait belt;Oxygen Activity Tolerance: Patient limited by pain;Patient limited by fatigue Patient left: in chair;with call bell/phone within reach;with chair alarm set Nurse Communication: Mobility status PT Visit Diagnosis: Unsteadiness on feet (R26.81)     Time: [id number] PT Time Calculation (min) (ACUTE ONLY): 13 min  Charges:  $Gait Training: 8-22 mins                     Jaunita Mikels M,PT Acute Rehab Services [phone] (pager)    Alvira Philips 03/17/2021, 2:23 PM

## 2021-03-17 NOTE — Progress Notes (Signed)
PROGRESS NOTE    Danielle Blanchard  [id number] DOB: Aug 27, 1940 DOA: 03/09/2021 PCP: [facility]   Brief Narrative: 80 year old with past medical history significant for sick sinus syndrome post PPM, paroxysmal A. 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  She presented with left side chest pain 12/09, CT chest abdomen and pelvis showed large hiatal hernia with gastric body and antrum intrathoracic in location and air-fluid levels in the abdomen, unchanged from previous exam.   Patient was evaluated by GI who recommended surgical consultation no need for repeat endoscopy.  She was evaluated by surgery, he was treated conservatively with bowel rest and IV fluids.  Surgery recommended robotic XI taken down of the stomach and sac from the mediastinum with endoscopic PEG placed by Dr. Bobbye Morton on 03/15/2021.    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 : -Patient recently discharged from the same month. -She was treated conservatively with bowel rest and IV fluids her symptoms improve. -Surgery evaluated patient and proceeded with robotic Xi taken down of the stomach and sac from the mediastinum with endoscopic PEG placed by Dr. Bobbye Morton on 03/15/2021. -Diet advanced to Soft.  -Further management per surgery -Abdominal binder in place.    2-Paroxysmal A. fib: Coreg and amiodarone On Heparin Gtt.  Transition to oral anticoagulation when ok by sx.    3-Leukocytosis: PNA Chest x ray ; Moderate Right basilar opacity.  Started  IV antibiotics 12/16:  Vancomycin and Cefepime.  Incentive spirometry.   -HTN: Continue with Coreg.  -Sinus syndrome status post pacemaker  -Chronic Diastolic heart failure Monitor volume status.   -Hypokalemia: Resolved.   Mild transaminases; monitor.   Nutrition Problem: Inadequate oral intake Etiology: inability to eat    Signs/Symptoms: NPO status    Interventions: Ensure Enlive (each supplement provides 350kcal and 20 grams of protein), MVI  Estimated body mass index is 28.31 kg/m as calculated from the following:   Height as of this encounter: 5\' 2"  (1.575 m).   Weight as of this encounter: 70.2 kg.   DVT prophylaxis: Heparin Gtt Code Status: Discussed with Son, patient wouldn't want to be resuscitated. Change her code status to DNR Family Communication: Disposition Plan:  Status is: Inpatient  Remains inpatient appropriate because: gastric volvulus post sx.         Consultants:  Surgery GI  Procedures:    Antimicrobials:    Subjective: She is more alert today, she is trying to eat breakfast.  Mild abdominal discomfort.   Objective: Vitals:   03/16/21 1554 03/16/21 1947 03/17/21 0359 03/17/21 0804  BP: (!) 127/55 (!) 111/49 (!) 113/51 (!) 125/53  Pulse: 60 60 60 (!) 59  Resp: 18 16 17 18   Temp: 98.1 F (36.7 C) 98 F (36.7 C) 98.1 F (36.7 C) 98.1 F (36.7 C)  TempSrc: Oral Oral Oral Oral  SpO2: 96% 97% 98% 96%  Weight:      Height:        Intake/Output Summary (Last 24 hours) at 03/17/2021 1411  Last data filed at 03/17/2021 1042 Gross per 24 hour  Intake 1186.15 ml  Output 775 ml  Net 411.15 ml    Filed Weights   03/09/21 2224 03/13/21 1123  Weight: 71.8 kg 70.2 kg    Examination:  General exam: NAD, Alert Respiratory system: CTA Cardiovascular system: S 1, S 2 RRR Gastrointestinal system: BS present, soft, nt G tube, binder in  place Central nervous system: Alert today, follows command Extremities: no edema    Data Reviewed: I have personally reviewed following labs and imaging studies  CBC: Recent Labs  Lab 03/13/21 0053 03/14/21 0128 03/15/21 0422 03/16/21 0630 03/17/21 0202  WBC 5.2 5.6 10.9* 18.0* 9.4  NEUTROABS  --   --  10.0*  --   --   HGB 9.2* 9.9* 11.1* 11.3* 8.8*  HCT 28.7* 30.6* 33.9* 35.5* 27.9*  MCV 96.0 96.2 95.8 97.3 97.6  PLT 142* 142* 168 174 134*    Basic Metabolic Panel: Recent Labs  Lab 03/11/21 0044 03/12/21 0053 03/13/21 0053 03/14/21 0128 03/15/21 0422 03/16/21 0630 03/17/21 0202  NA 139 137 140 138 135 134* 134*  K 4.2 3.5 4.1 3.5 4.1 4.0 3.7  CL 103 101 107 107 104 101 105  CO2 29 25 25 23 22 25 24   GLUCOSE 78 85 92 87 161* 137* 105*  BUN 8 7* 5* <5* 10 15 16   CREATININE 0.74 0.75 0.68 0.85 0.85 0.71 0.77  CALCIUM 8.7* 8.7* 8.8* 8.6* 8.6* 8.9 8.2*  MG 1.9 1.8 2.0  --  1.9  --   --   PHOS  --   --  3.2  --  4.5  --   --     GFR: Estimated Creatinine Clearance: 49.7 mL/min (by C-G formula based on SCr of 0.77 mg/dL). Liver Function Tests: Recent Labs  Lab 03/13/21 0053 03/14/21 0128 03/15/21 0422 03/16/21 0630 03/17/21 0202  AST 35 36 56* 58* 66*  ALT 36 35 45* 51* 58*  ALKPHOS 65 65 91 110 105  BILITOT 0.7 0.8 1.1 1.5* 1.1  PROT 5.6* 5.5* 6.3* 6.0* 5.0*  ALBUMIN 3.0* 2.9* 3.0* 3.1* 2.5*    No results for input(s): LIPASE, AMYLASE in the last 168 hours. No results for input(s): AMMONIA in the last 168 hours. Coagulation Profile: No results for input(s): INR, PROTIME in the last 168 hours. Cardiac Enzymes: No results for input(s): CKTOTAL, CKMB, CKMBINDEX, TROPONINI in the last 168 hours. BNP (last 3 results) No results for input(s): PROBNP in the last 8760 hours. HbA1C: No results for input(s): HGBA1C in the last 72 hours. CBG: No results for input(s): GLUCAP in the last 168 hours. Lipid Profile: No results for input(s): CHOL, HDL, LDLCALC,  TRIG, CHOLHDL, LDLDIRECT in the last 72 hours. Thyroid Function Tests: No results for input(s): TSH, T4TOTAL, FREET4, T3FREE, THYROIDAB in the last 72 hours. Anemia Panel: No results for input(s): VITAMINB12, FOLATE, FERRITIN, TIBC, IRON, RETICCTPCT in the last 72 hours. Sepsis Labs: Recent Labs  Lab 03/15/21 0422  LATICACIDVEN 0.9     Recent Results (from the past 240 hour(s))  Resp Panel by RT-PCR (Flu A&B, Covid) Nasopharyngeal Swab     Status: None   Collection Time: 03/10/21  1:56 AM   Specimen: Nasopharyngeal Swab; Nasopharyngeal(NP) swabs in vial transport medium  Result Value Ref Range Status   SARS Coronavirus 2 by RT PCR NEGATIVE NEGATIVE Final    Comment: (NOTE) SARS-CoV-2 target nucleic acids are NOT DETECTED.  The SARS-CoV-2 RNA is generally detectable  in upper respiratory specimens during the acute phase of infection. The lowest concentration of SARS-CoV-2 viral copies this assay can detect is 138 copies/mL. A negative result does not preclude SARS-Cov-2 infection and should not be used as the sole basis for treatment or other patient management decisions. A negative result may occur with  improper specimen collection/handling, submission of specimen other than nasopharyngeal swab, presence of viral mutation(s) within the areas targeted by this assay, and inadequate number of viral copies(<138 copies/mL). A negative result must be combined with clinical observations, patient history, and epidemiological information. The expected result is Negative.  Fact Sheet for Patients:  BloggerCourse.com  Fact Sheet for Healthcare Providers:  SeriousBroker.it  This test is no t yet approved or cleared by the [place] FDA and  has been authorized for detection and/or diagnosis of SARS-CoV-2 by FDA under an Emergency Use Authorization (EUA). This EUA will remain  in effect (meaning this test can be used) for the  duration of the COVID-19 declaration under Section 564(b)(1) of the Act, 21 U.S.C.section 360bbb-3(b)(1), unless the authorization is terminated  or revoked sooner.       Influenza A by PCR NEGATIVE NEGATIVE Final   Influenza B by PCR NEGATIVE NEGATIVE Final    Comment: (NOTE) The Xpert Xpress SARS-CoV-2/FLU/RSV plus assay is intended as an aid in the diagnosis of influenza from Nasopharyngeal swab specimens and should not be used as a sole basis for treatment. Nasal washings and aspirates are unacceptable for Xpert Xpress SARS-CoV-2/FLU/RSV testing.  Fact Sheet for Patients: BloggerCourse.com  Fact Sheet for Healthcare Providers: SeriousBroker.it  This test is not yet approved or cleared by the [place] FDA and has been authorized for detection and/or diagnosis of SARS-CoV-2 by FDA under an Emergency Use Authorization (EUA). This EUA will remain in effect (meaning this test can be used) for the duration of the COVID-19 declaration under Section 564(b)(1) of the Act, 21 U.S.C. section 360bbb-3(b)(1), unless the authorization is terminated or revoked.  Performed at [facility], [address], [place], [place] [address]   Surgical pcr screen     Status: Abnormal   Collection Time: 03/11/21  6:12 AM   Specimen: Nasal Mucosa; Nasal Swab  Result Value Ref Range Status   MRSA, PCR NEGATIVE NEGATIVE Final   Staphylococcus aureus POSITIVE (A) NEGATIVE Final    Comment: (NOTE) The Xpert SA Assay (FDA approved for NASAL specimens in patients 82 years of age and older), is one component of a comprehensive surveillance program. It is not intended to diagnose infection nor to guide or monitor treatment. Performed at [facility], [address] [place], [place] [address]           Radiology Studies: DG Chest 2 View  Result Date: 03/16/2021 CLINICAL DATA:  Leukocytosis. EXAM: CHEST - 2 VIEW COMPARISON:   March 09, 2021. FINDINGS: Stable cardiomegaly. Left-sided pacemaker is unchanged in position. No pneumothorax is noted. Large hiatal hernia is again noted. Moderate right basilar opacity is noted concerning for atelectasis with associated pleural effusion. Mild left basilar subsegmental atelectasis is noted. Bony thorax is unremarkable. IMPRESSION: Large hiatal hernia is noted. Moderate right basilar opacity is now noted concerning for atelectasis with associated pleural effusion. Electronically Signed   By: Lupita Raider M.D.   On: 03/16/2021 10:22        Scheduled Meds:  acetaminophen  650 mg Oral Q6H   amiodarone  200 mg Oral Daily   carvedilol  3.125 mg  Oral BID WC   Chlorhexidine Gluconate Cloth  6 each Topical Daily   docusate sodium  100 mg Oral BID   feeding supplement  237 mL Oral BID BM   free water  30 mL Per Tube Daily   levothyroxine  75 mcg Oral Q0600   lidocaine  1 patch Transdermal Q24H   melatonin  3 mg Oral QHS   multivitamin with minerals  1 tablet Oral Daily   mupirocin ointment  1 application Nasal BID   pantoprazole  40 mg Oral Daily   polyethylene glycol  17 g Oral Daily   rosuvastatin  20 mg Oral Daily   Continuous Infusions:  sodium chloride 100 mL/hr at 03/17/21 0527   ceFEPime (MAXIPIME) IV 2 g (03/17/21 0902)   heparin 1,000 Units/hr (03/16/21 1905)   vancomycin       LOS: 7 days    Time spent: 35 minutes    Janis Sol A Vonte Rossin, MD [facility]   If 7PM-7AM, please contact night-coverage [email or web address]  03/17/2021, 2:11 PM

## 2021-03-17 NOTE — Progress Notes (Signed)
Pt son would like to have update in the am. [employer] [phone]

## 2021-03-17 NOTE — TOC Progression Note (Addendum)
Transition of Care [facility]) - Progression Note    Patient Details  Name: Fraya Ueda MRN: [id number] Date of Birth: 08/27/1940  Transition of [facility]) CM/SW Contact  Erin Sons, [place] Phone Number: 03/17/2021, 11:30 AM  Clinical Narrative:     CSW called pt son and discussed SNF offers. Son is most interested in [facility]. He reports that pt has not had any covid vaccinations; this could be a potential barrier. CSW left message with [facility] liaison requesting confirmation that they can accept pt with being unvaccinated; awaiting response.   1226: [employer]; no answer and voicemail box full. Called main number; transferred to admissions; left voicemail requesting return call.   1258: CSW called [facility] liaison again; no answer. CSW sent text message requesting response.   1322: Received call back from [facility]; they can accept pt on Monday. CSW called pt son and confirmed this plan. Auth will need to be requested on Sunday and covid test collected on Sunday.   Expected Discharge Plan: Skilled Nursing Facility Barriers to Discharge: English as a second language teacher, Continued Medical Work up  Expected Discharge Plan and Services Expected Discharge Plan: Skilled Nursing Facility       Living arrangements for the past 2 months: Skilled Nursing Facility                                       Social Determinants of Health (SDOH) Interventions    Readmission Risk Interventions No flowsheet data found.

## 2021-03-17 NOTE — Progress Notes (Addendum)
Progress Note  3 Days Post-Op  Subjective: Pt reports improvement in abdominal pain. She is unsure if she ate much yesterday. She is still A&Ox4 but seems somewhat forgetful, thought surgery was yesterday. UOP has been low, but she does not report specific urinary symptoms and UA yesterday was not suggestive of UTI.   Objective: Vital signs in last 24 hours: Temp:  [98 F (36.7 C)-98.1 F (36.7 C)] 98.1 F (36.7 C) (12/16 0804) Pulse Rate:  [59-60] 59 (12/16 0804) Resp:  [16-18] 18 (12/16 0804) BP: (111-127)/(49-55) 125/53 (12/16 0804) SpO2:  [96 %-98 %] 96 % (12/16 0804) Last BM Date: 03/14/21  Intake/Output from previous day: 12/15 0701 - 12/16 0700 In: 1089.9 [P.O.:240; I.V.:749.9; IV Piggyback:100] Out: 425 [Urine:425] Intake/Output this shift: No intake/output data recorded.  PE: General: pleasant, WD, elderly female who is laying in bed in NAD Heart: regular, rate, and rhythm.   Lungs:  Respiratory effort nonlabored, lungs CTAB, pulled 750 on IS Abd: soft, appropriately ttp, ND, +BS, incisions c/d/I, PEG in place and clamped, binder present MS: all 4 extremities are symmetrical with no cyanosis, clubbing, or edema. Skin: warm and dry with no masses, lesions, or rashes Psych: A&Ox4 with some mild confusion regarding date of surgery    Lab Results:  Recent Labs    03/16/21 0630 03/17/21 0202  WBC 18.0* 9.4  HGB 11.3* 8.8*  HCT 35.5* 27.9*  PLT 174 134*   BMET Recent Labs    03/16/21 0630 03/17/21 0202  NA 134* 134*  K 4.0 3.7  CL 101 105  CO2 25 24  GLUCOSE 137* 105*  BUN 15 16  CREATININE 0.71 0.77  CALCIUM 8.9 8.2*   PT/INR No results for input(s): LABPROT, INR in the last 72 hours. CMP     Component Value Date/Time   NA 134 (L) 03/17/2021 0202   K 3.7 03/17/2021 0202   CL 105 03/17/2021 0202   CO2 24 03/17/2021 0202   GLUCOSE 105 (H) 03/17/2021 0202   BUN 16 03/17/2021 0202   CREATININE 0.77 03/17/2021 0202   CALCIUM 8.2 (L) 03/17/2021  0202   PROT 5.0 (L) 03/17/2021 0202   ALBUMIN 2.5 (L) 03/17/2021 0202   AST 66 (H) 03/17/2021 0202   ALT 58 (H) 03/17/2021 0202   ALKPHOS 105 03/17/2021 0202   BILITOT 1.1 03/17/2021 0202   GFRNONAA >60 03/17/2021 0202   GFRAA >60 12/05/2019 2254   Lipase     Component Value Date/Time   LIPASE 42 02/11/2021 2017       Studies/Results: DG Chest 2 View  Result Date: 03/16/2021 CLINICAL DATA:  Leukocytosis. EXAM: CHEST - 2 VIEW COMPARISON:  March 09, 2021. FINDINGS: Stable cardiomegaly. Left-sided pacemaker is unchanged in position. No pneumothorax is noted. Large hiatal hernia is again noted. Moderate right basilar opacity is noted concerning for atelectasis with associated pleural effusion. Mild left basilar subsegmental atelectasis is noted. Bony thorax is unremarkable. IMPRESSION: Large hiatal hernia is noted. Moderate right basilar opacity is now noted concerning for atelectasis with associated pleural effusion. Electronically Signed   By: Marijo Conception M.D.   On: 03/16/2021 10:22    Anti-infectives: Anti-infectives (From admission, onward)    Start     Dose/Rate Route Frequency Ordered Stop   03/17/21 1930  vancomycin (VANCOCIN) IVPB 1000 mg/200 mL premix        1,000 mg 200 mL/hr over 60 Minutes Intravenous Every 24 hours 03/16/21 1054     03/16/21 1200  vancomycin (  VANCOREADY) IVPB 1500 mg/300 mL        1,500 mg 150 mL/hr over 120 Minutes Intravenous  Once 03/16/21 1054 03/16/21 2132   03/16/21 1100  ceFEPIme (MAXIPIME) 2 g in sodium chloride 0.9 % 100 mL IVPB        2 g 200 mL/hr over 30 Minutes Intravenous Every 12 hours 03/16/21 1054     03/14/21 0600  ceFAZolin (ANCEF) IVPB 2g/100 mL premix        2 g 200 mL/hr over 30 Minutes Intravenous On call to O.R. 03/13/21 1210 03/14/21 1023        Assessment/Plan Hiatal hernia  POD3 S/P robotic hiatal hernia repair with PEG placement 12/13 Dr. Daphine Deutscher - continue G-tube clamped, flush daily for tube maintenance -  abdominal binder applied at bedside with RN this AM, continue this to protect PEG - tolerating soft diet  - continue scheduled tylenol and lidocaine patch for pain control, tramadol q6h prn  - continue to mobilize, PT/OT recommended SNF 2 days ago  - UOP low still, IVF @100  cc/h Leukocytosis - WBC normalized this AM, CXR with possible consolidation vs atelectasis in RLL, abx started for HCAP by primary attending - could try to get respiratory cx although patient not coughing   FEN: soft diet, IVF @100  cc/h VTE: heparin gtt ID: ancef pre-op; cefepime/vanc 12/15>> Foley: removed this AM   Sick sinus syndrome s/p pacemaker Paroxysmal A. Fib on eliquis - eliquis on hold Chronic diastolic CHF - EF is 60-65% on echo 12/9 HTN HLD GERD  LOS: 7 days    , [facility] Surgery 03/17/2021, 8:53 AM Please see Amion for pager number during day hours 7:00am-4:30pm

## 2021-03-18 LAB — COMPREHENSIVE METABOLIC PANEL
ALT: 45 U/L — ABNORMAL HIGH (ref 0–44)
AST: 38 U/L (ref 15–41)
Albumin: 2.3 g/dL — ABNORMAL LOW (ref 3.5–5.0)
Alkaline Phosphatase: 117 U/L (ref 38–126)
Anion gap: 6 (ref 5–15)
BUN: 18 mg/dL (ref 8–23)
CO2: 22 mmol/L (ref 22–32)
Calcium: 7.9 mg/dL — ABNORMAL LOW (ref 8.9–10.3)
Chloride: 104 mmol/L (ref 98–111)
Creatinine, Ser: 0.66 mg/dL (ref 0.44–1.00)
GFR, Estimated: >60 mL/min (ref >60)
Glucose, Bld: 141 mg/dL — ABNORMAL HIGH (ref 70–99)
Potassium: 3.4 mmol/L — ABNORMAL LOW (ref 3.5–5.1)
Sodium: 132 mmol/L — ABNORMAL LOW (ref 135–145)
Total Bilirubin: 1 mg/dL (ref 0.3–1.2)
Total Protein: 5 g/dL — ABNORMAL LOW (ref 6.5–8.1)

## 2021-03-18 LAB — MRSA NEXT GEN BY PCR, NASAL: MRSA by PCR Next Gen: NOT DETECTED

## 2021-03-18 LAB — CBC
HCT: 27.3 % — ABNORMAL LOW (ref 36.0–46.0)
Hemoglobin: 8.6 g/dL — ABNORMAL LOW (ref 12.0–15.0)
MCH: 30.7 pg (ref 26.0–34.0)
MCHC: 31.5 g/dL (ref 30.0–36.0)
MCV: 97.5 fL (ref 80.0–100.0)
Platelets: 130 10*3/uL — ABNORMAL LOW (ref 150–400)
RBC: 2.8 MIL/uL — ABNORMAL LOW (ref 3.87–5.11)
RDW: 16.2 % — ABNORMAL HIGH (ref 11.5–15.5)
WBC: 7.5 10*3/uL (ref 4.0–10.5)
nRBC: 0 % (ref 0.0–0.2)

## 2021-03-18 LAB — HEPARIN LEVEL (UNFRACTIONATED): Heparin Unfractionated: 0.22 IU/mL — ABNORMAL LOW (ref 0.30–0.70)

## 2021-03-18 MED ORDER — APIXABAN 5 MG PO TABS
5.0000 mg | ORAL_TABLET | Freq: Two times a day (BID) | ORAL | Status: DC
  Administered 2021-03-18 – 2021-03-21 (×7): 5 mg via ORAL
  Filled 2021-03-18 (×7): qty 1

## 2021-03-18 MED ORDER — SENNA 8.6 MG PO TABS
1.0000 | ORAL_TABLET | Freq: Every day | ORAL | Status: DC
  Administered 2021-03-18 – 2021-03-21 (×4): 8.6 mg via ORAL
  Filled 2021-03-18 (×4): qty 1

## 2021-03-18 MED ORDER — POTASSIUM CHLORIDE CRYS ER 20 MEQ PO TBCR
40.0000 meq | EXTENDED_RELEASE_TABLET | Freq: Two times a day (BID) | ORAL | Status: AC
  Administered 2021-03-18 (×2): 40 meq via ORAL
  Filled 2021-03-18 (×4): qty 2

## 2021-03-18 MED ORDER — ENSURE ENLIVE PO LIQD
237.0000 mL | Freq: Three times a day (TID) | ORAL | Status: DC
  Administered 2021-03-18 – 2021-03-20 (×8): 237 mL via ORAL

## 2021-03-18 NOTE — Progress Notes (Signed)
Progress Note  4 Days Post-Op  Subjective: Pt reports appetite is gradually improving but denies nausea. She is passing some flatus, does not appear she has had a BM in a few days. Abdomen is sore at times but pain control is overall better.   Objective: Vital signs in last 24 hours: Temp:  [98 F (36.7 C)-98.4 F (36.9 C)] 98.4 F (36.9 C) (12/17 0754) Pulse Rate:  [58-62] 60 (12/17 0754) Resp:  [16-18] 18 (12/17 0754) BP: (115-137)/(45-55) 137/55 (12/17 0754) SpO2:  [96 %-99 %] 98 % (12/17 0754) Last BM Date: 03/14/21  Intake/Output from previous day: 12/16 0701 - 12/17 0700 In: 300 [P.O.:300] Out: 350 [Urine:350] Intake/Output this shift: No intake/output data recorded.  PE: General: pleasant, WD, elderly female who is laying in bed in NAD Heart: regular, rate, and rhythm.   Lungs:  Respiratory effort nonlabored, lungs CTAB Abd: soft, appropriately ttp, ND, +BS, incisions c/d/I, PEG in place and clamped, binder present MS: all 4 extremities are symmetrical with no cyanosis, clubbing, or edema. Skin: warm and dry with no masses, lesions, or rashes Psych: A&Ox4 with some mild confusion regarding date of surgery    Lab Results:  Recent Labs    03/17/21 0202 03/18/21 0144  WBC 9.4 7.5  HGB 8.8* 8.6*  HCT 27.9* 27.3*  PLT 134* 130*   BMET Recent Labs    03/17/21 0202 03/18/21 0144  NA 134* 132*  K 3.7 3.4*  CL 105 104  CO2 24 22  GLUCOSE 105* 141*  BUN 16 18  CREATININE 0.77 0.66  CALCIUM 8.2* 7.9*   PT/INR No results for input(s): LABPROT, INR in the last 72 hours. CMP     Component Value Date/Time   NA 132 (L) 03/18/2021 0144   K 3.4 (L) 03/18/2021 0144   CL 104 03/18/2021 0144   CO2 22 03/18/2021 0144   GLUCOSE 141 (H) 03/18/2021 0144   BUN 18 03/18/2021 0144   CREATININE 0.66 03/18/2021 0144   CALCIUM 7.9 (L) 03/18/2021 0144   PROT 5.0 (L) 03/18/2021 0144   ALBUMIN 2.3 (L) 03/18/2021 0144   AST 38 03/18/2021 0144   ALT 45 (H) 03/18/2021  0144   ALKPHOS 117 03/18/2021 0144   BILITOT 1.0 03/18/2021 0144   GFRNONAA >60 03/18/2021 0144   GFRAA >60 12/05/2019 2254   Lipase     Component Value Date/Time   LIPASE 42 02/11/2021 2017       Studies/Results: DG Chest 2 View  Result Date: 03/16/2021 CLINICAL DATA:  Leukocytosis. EXAM: CHEST - 2 VIEW COMPARISON:  March 09, 2021. FINDINGS: Stable cardiomegaly. Left-sided pacemaker is unchanged in position. No pneumothorax is noted. Large hiatal hernia is again noted. Moderate right basilar opacity is noted concerning for atelectasis with associated pleural effusion. Mild left basilar subsegmental atelectasis is noted. Bony thorax is unremarkable. IMPRESSION: Large hiatal hernia is noted. Moderate right basilar opacity is now noted concerning for atelectasis with associated pleural effusion. Electronically Signed   By: Lupita Raider M.D.   On: 03/16/2021 10:22    Anti-infectives: Anti-infectives (From admission, onward)    Start     Dose/Rate Route Frequency Ordered Stop   03/17/21 1930  vancomycin (VANCOCIN) IVPB 1000 mg/200 mL premix        1,000 mg 200 mL/hr over 60 Minutes Intravenous Every 24 hours 03/16/21 1054     03/16/21 1200  vancomycin (VANCOREADY) IVPB 1500 mg/300 mL        1,500 mg 150 mL/hr  over 120 Minutes Intravenous  Once 03/16/21 1054 03/16/21 2132   03/16/21 1100  ceFEPIme (MAXIPIME) 2 g in sodium chloride 0.9 % 100 mL IVPB        2 g 200 mL/hr over 30 Minutes Intravenous Every 12 hours 03/16/21 1054     03/14/21 0600  ceFAZolin (ANCEF) IVPB 2g/100 mL premix        2 g 200 mL/hr over 30 Minutes Intravenous On call to O.R. 03/13/21 1210 03/14/21 1023        Assessment/Plan Hiatal hernia  POD4 S/P robotic hiatal hernia repair with PEG placement 12/13 Dr. Daphine Deutscher - continue G-tube clamped, flush daily for tube maintenance - abdominal binder applied at bedside with RN this AM, continue this to protect PEG - tolerating soft diet  - continue scheduled  tylenol and lidocaine patch for pain control, tramadol q6h prn  - continue to mobilize, PT/OT recommending SNF  - UOP improving and appears less concentrated - stable for discharge from a surgical standpoint when medically cleared. Follow up in [facility] already  Leukocytosis - WBC normalized, CXR with possible consolidation vs atelectasis in RLL, abx started for HCAP by primary attending - could try to get respiratory cx although patient not coughing   FEN: soft diet, IVF @50  cc/h VTE: heparin gtt - OK to transition to PO anticoagulation today ID: ancef pre-op; cefepime/vanc 12/15>> Foley: removed POD2   Sick sinus syndrome s/p pacemaker Paroxysmal A. Fib on eliquis - eliquis on hold Chronic diastolic CHF - EF is 60-65% on echo 12/9 HTN HLD GERD  LOS: 8 days    14/9, [facility] Surgery 03/18/2021, 8:03 AM Please see Amion for pager number during day hours 7:00am-4:30pm

## 2021-03-18 NOTE — Progress Notes (Signed)
Mobility Specialist Progress Note    03/18/21 1455  Mobility  Activity Sat and stood x 3  Level of Assistance Moderate assist, patient does 50-74%  Assistive Device Front wheel walker  Mobility Out of bed to chair with meals  Mobility Response Tolerated fair  Mobility performed by Mobility specialist  Bed Position Chair  $Mobility charge 1 Mobility   Pt received in chair and agreeable. During first STS pt stated she felt like she was going backwards and needed to sit down. Pt proceeded to stand two more times with modA and did not stand more than ~15 seconds at a time. Pt stated she just felt low energy. Left in chair with call bell in reach.   [facility] Mobility Specialist  M.S. Primary Phone: [phone] M.S. Secondary Phone: [phone]

## 2021-03-18 NOTE — Progress Notes (Signed)
ANTICOAGULATION CONSULT NOTE Pharmacy Consult for heparin Indication: atrial fibrillation  No Known Allergies  Patient Measurements: Height: 5\' 2"  (157.5 cm) Weight: 70.2 kg (154 lb 12.8 oz) IBW/kg (Calculated) : 50.1 Heparin Dosing Weight: 65.4 kg  Vital Signs: Temp: 98 F (36.7 C) (12/16 2101) Temp Source: Oral (12/16 2101) BP: 115/55 (12/16 2101) Pulse Rate: 62 (12/16 2101)  Labs: Recent Labs    03/15/21 0422 03/15/21 1811 03/15/21 1811 03/16/21 0603 03/16/21 0630 03/16/21 1416 03/17/21 0202 03/18/21 0144  HGB 11.1*  --   --   --  11.3*  --  8.8* 8.6*  HCT 33.9*  --   --   --  35.5*  --  27.9* 27.3*  PLT 168  --   --   --  174  --  134* 130*  APTT  --  36  --  57*  --   --   --   --   HEPARINUNFRC  --  0.13*   < > 0.62  --  0.68 0.36 0.22*  CREATININE 0.85  --   --   --  0.71  --  0.77  --    < > = values in this interval not displayed.     Estimated Creatinine Clearance: 49.7 mL/min (by C-G formula based on SCr of 0.77 mg/dL).   Assessment:  80 y.o. female with h/o Afib, Eliquis on hold, for heparin  Goal of Therapy:  Heparin level 0.3-0.7 units/mL Monitor platelets by anticoagulation protocol: Yes   Plan:  Increase Heparin  1100 units/hr  97, PharmD, BCPS

## 2021-03-18 NOTE — Progress Notes (Signed)
PROGRESS NOTE    Danielle Blanchard  [id number] DOB: December 18, 1940 DOA: 03/09/2021 PCP: [facility]   Brief Narrative: 80 year old with past medical history significant for sick sinus syndrome post PPM, paroxysmal A. 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  She presented with left side chest pain 12/09, CT chest abdomen and pelvis showed large hiatal hernia with gastric body and antrum intrathoracic in location and air-fluid levels in the abdomen, unchanged from previous exam.   Patient was evaluated by GI who recommended surgical consultation no need for repeat endoscopy.  She was evaluated by surgery, he was treated conservatively with bowel rest and IV fluids.  Surgery recommended robotic XI taken down of the stomach and sac from the mediastinum with endoscopic PEG placed by Dr. Bobbye Morton on 03/15/2021.    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 : -Patient recently discharged from the same month. -She was treated conservatively with bowel rest and IV fluids her symptoms improve. -Surgery evaluated patient and proceeded with robotic Xi taken down of the stomach and sac from the mediastinum with endoscopic PEG placed by Dr. Bobbye Morton on 03/15/2021. -Diet advanced to Soft.  -Further management per surgery -Abdominal binder in place.  Continue with ensure.   2-Paroxysmal A. fib: Coreg and amiodarone Transition to eliquis today.   3-Leukocytosis:  PNA Chest x ray ; Moderate Right basilar opacity.  Started  IV antibiotics 12/16:  Vancomycin and Cefepime.  Incentive spirometry.  Improved.   -HTN: Continue with Coreg.  -Sinus syndrome status post pacemaker  -Chronic Diastolic heart failure Monitor volume status.   -Hypokalemia: Replete orally   Mild transaminases; monitor. Trending down.   Nutrition Problem: Inadequate oral intake Etiology: inability to eat    Signs/Symptoms: NPO status    Interventions: Ensure Enlive (each supplement provides 350kcal and 20 grams of protein), MVI  Estimated body mass index is 28.31 kg/m as calculated from the following:   Height as of this encounter: 5\' 2"  (1.575 m).   Weight as of this encounter: 70.2 kg.   DVT prophylaxis: Heparin Gtt Code Status: DNR Family Communication: Disposition Plan:  Status is: Inpatient  Remains inpatient appropriate because: SNF hopefully Monday        Consultants:  Surgery GI  Procedures:    Antimicrobials:    Subjective: Poor oral intake. Didn't like food.    Objective: Vitals:   03/17/21 1601 03/17/21 2101 03/18/21 0441 03/18/21 0754  BP: (!) 120/52 (!) 115/55 (!) 125/45 (!) 137/55  Pulse: (!) 59 62 (!) 58 60  Resp: 17 16 16 18   Temp: 98.2 F (36.8 C) 98 F (36.7 C) 98.2 F (36.8 C) 98.4 F (36.9 C)  TempSrc: Oral Oral Oral Oral  SpO2: 97% 98% 99% 98%  Weight:      Height:        Intake/Output Summary (Last 24 hours) at 03/18/2021 1447 Last data filed at 03/17/2021 1448 Gross per 24 hour  Intake 60 ml  Output --  Net 60 ml  Filed Weights   03/09/21 2224 03/13/21 1123  Weight: 71.8 kg 70.2 kg    Examination:  General exam: NAD, Alert Respiratory system: CTA Cardiovascular system: S 1, S 2 RRR Gastrointestinal system: BS present, soft, G tube, binder in place Central nervous system: Alert, follows command Extremities: No edema    Data Reviewed: I have personally reviewed following labs and  imaging studies  CBC: Recent Labs  Lab 03/14/21 0128 03/15/21 0422 03/16/21 0630 03/17/21 0202 03/18/21 0144  WBC 5.6 10.9* 18.0* 9.4 7.5  NEUTROABS  --  10.0*  --   --   --   HGB 9.9* 11.1* 11.3* 8.8* 8.6*  HCT 30.6* 33.9* 35.5* 27.9* 27.3*  MCV 96.2 95.8 97.3 97.6 97.5  PLT 142* 168 174 134* 130*    Basic Metabolic Panel: Recent Labs  Lab 03/12/21 0053 03/13/21 0053 03/14/21 0128 03/15/21 0422 03/16/21 0630 03/17/21 0202 03/18/21 0144  NA 137 140 138 135 134* 134* 132*  K 3.5 4.1 3.5 4.1 4.0 3.7 3.4*  CL 101 107 107 104 101 105 104  CO2 25 25 23 22 25 24 22   GLUCOSE 85 92 87 161* 137* 105* 141*  BUN 7* 5* <5* 10 15 16 18   CREATININE 0.75 0.68 0.85 0.85 0.71 0.77 0.66  CALCIUM 8.7* 8.8* 8.6* 8.6* 8.9 8.2* 7.9*  MG 1.8 2.0  --  1.9  --   --   --   PHOS  --  3.2  --  4.5  --   --   --     GFR: Estimated Creatinine Clearance: 49.7 mL/min (by C-G formula based on SCr of 0.66 mg/dL). Liver Function Tests: Recent Labs  Lab 03/14/21 0128 03/15/21 0422 03/16/21 0630 03/17/21 0202 03/18/21 0144  AST 36 56* 58* 66* 38  ALT 35 45* 51* 58* 45*  ALKPHOS 65 91 110 105 117  BILITOT 0.8 1.1 1.5* 1.1 1.0  PROT 5.5* 6.3* 6.0* 5.0* 5.0*  ALBUMIN 2.9* 3.0* 3.1* 2.5* 2.3*    No results for input(s): LIPASE, AMYLASE in the last 168 hours. No results for input(s): AMMONIA in the last 168 hours. Coagulation Profile: No results for input(s): INR, PROTIME in the last 168 hours. Cardiac Enzymes: No results for input(s): CKTOTAL, CKMB, CKMBINDEX, TROPONINI in the last 168 hours. BNP (last 3 results) No results for input(s): PROBNP in the last 8760 hours. HbA1C: No results for input(s): HGBA1C in the last 72 hours. CBG: No results for input(s): GLUCAP in the last 168 hours. Lipid Profile: No results for input(s): CHOL, HDL, LDLCALC, TRIG, CHOLHDL, LDLDIRECT in the last 72 hours. Thyroid Function Tests: No results for input(s): TSH, T4TOTAL, FREET4, T3FREE, THYROIDAB in  the last 72 hours. Anemia Panel: No results for input(s): VITAMINB12, FOLATE, FERRITIN, TIBC, IRON, RETICCTPCT in the last 72 hours. Sepsis Labs: Recent Labs  Lab 03/15/21 0422  LATICACIDVEN 0.9     Recent Results (from the past 240 hour(s))  Resp Panel by RT-PCR (Flu A&B, Covid) Nasopharyngeal Swab     Status: None   Collection Time: 03/10/21  1:56 AM   Specimen: Nasopharyngeal Swab; Nasopharyngeal(NP) swabs in vial transport medium  Result Value Ref Range Status   SARS Coronavirus 2 by RT PCR NEGATIVE NEGATIVE Final    Comment: (NOTE) SARS-CoV-2 target nucleic acids are NOT DETECTED.  The SARS-CoV-2 RNA is generally detectable in upper respiratory specimens during the acute phase of infection. The lowest concentration of SARS-CoV-2 viral copies this assay can detect is 138 copies/mL. A  negative result does not preclude SARS-Cov-2 infection and should not be used as the sole basis for treatment or other patient management decisions. A negative result may occur with  improper specimen collection/handling, submission of specimen other than nasopharyngeal swab, presence of viral mutation(s) within the areas targeted by this assay, and inadequate number of viral copies(<138 copies/mL). A negative result must be combined with clinical observations, patient history, and epidemiological information. The expected result is Negative.  Fact Sheet for Patients:  BloggerCourse.com  Fact Sheet for Healthcare Providers:  SeriousBroker.it  This test is no t yet approved or cleared by the [place] FDA and  has been authorized for detection and/or diagnosis of SARS-CoV-2 by FDA under an Emergency Use Authorization (EUA). This EUA will remain  in effect (meaning this test can be used) for the duration of the COVID-19 declaration under Section 564(b)(1) of the Act, 21 U.S.C.section 360bbb-3(b)(1), unless the authorization is terminated   or revoked sooner.       Influenza A by PCR NEGATIVE NEGATIVE Final   Influenza B by PCR NEGATIVE NEGATIVE Final    Comment: (NOTE) The Xpert Xpress SARS-CoV-2/FLU/RSV plus assay is intended as an aid in the diagnosis of influenza from Nasopharyngeal swab specimens and should not be used as a sole basis for treatment. Nasal washings and aspirates are unacceptable for Xpert Xpress SARS-CoV-2/FLU/RSV testing.  Fact Sheet for Patients: BloggerCourse.com  Fact Sheet for Healthcare Providers: SeriousBroker.it  This test is not yet approved or cleared by the [place] FDA and has been authorized for detection and/or diagnosis of SARS-CoV-2 by FDA under an Emergency Use Authorization (EUA). This EUA will remain in effect (meaning this test can be used) for the duration of the COVID-19 declaration under Section 564(b)(1) of the Act, 21 U.S.C. section 360bbb-3(b)(1), unless the authorization is terminated or revoked.  Performed at [facility], [address], [place], [place] [address]   Surgical pcr screen     Status: Abnormal   Collection Time: 03/11/21  6:12 AM   Specimen: Nasal Mucosa; Nasal Swab  Result Value Ref Range Status   MRSA, PCR NEGATIVE NEGATIVE Final   Staphylococcus aureus POSITIVE (A) NEGATIVE Final    Comment: (NOTE) The Xpert SA Assay (FDA approved for NASAL specimens in patients 3 years of age and older), is one component of a comprehensive surveillance program. It is not intended to diagnose infection nor to guide or monitor treatment. Performed at [facility], [address] [place], [place] [address]   MRSA Next Gen by PCR, Nasal     Status: None   Collection Time: 03/16/21 10:52 AM   Specimen: Nasal Mucosa; Nasal Swab  Result Value Ref Range Status   MRSA by PCR Next Gen NOT DETECTED NOT DETECTED Final    Comment: (NOTE) The GeneXpert MRSA Assay (FDA approved for NASAL specimens  only), is one component of a comprehensive MRSA colonization surveillance program. It is not intended to diagnose MRSA infection nor to guide or monitor treatment for MRSA infections. Test performance is not FDA approved in patients less than 3 years old. Performed at [facility], [address] [place], [place] [address]           Radiology Studies: No results found.      Scheduled Meds:  acetaminophen  650 mg Oral Q6H   amiodarone  200 mg Oral Daily   apixaban  5 mg Oral BID   carvedilol  3.125 mg Oral BID  WC   docusate sodium  100 mg Oral BID   feeding supplement  237 mL Oral BID BM   free water  30 mL Per Tube Daily   levothyroxine  75 mcg Oral Q0600   lidocaine  1 patch Transdermal Q24H   melatonin  3 mg Oral QHS   multivitamin with minerals  1 tablet Oral Daily   mupirocin ointment  1 application Nasal BID   pantoprazole  40 mg Oral Daily   polyethylene glycol  17 g Oral Daily   potassium chloride  40 mEq Oral BID   rosuvastatin  20 mg Oral Daily   Continuous Infusions:  ceFEPime (MAXIPIME) IV 2 g (03/17/21 2046)     LOS: 8 days    Time spent: 35 minutes    Brantley Naser A Abby Tucholski, MD [facility]   If 7PM-7AM, please contact night-coverage [email or web address]  03/18/2021, 2:47 PM

## 2021-03-19 LAB — COMPREHENSIVE METABOLIC PANEL
ALT: 38 U/L (ref 0–44)
AST: 30 U/L (ref 15–41)
Albumin: 2.4 g/dL — ABNORMAL LOW (ref 3.5–5.0)
Alkaline Phosphatase: 131 U/L — ABNORMAL HIGH (ref 38–126)
Anion gap: 4 — ABNORMAL LOW (ref 5–15)
BUN: 12 mg/dL (ref 8–23)
CO2: 24 mmol/L (ref 22–32)
Calcium: 8.2 mg/dL — ABNORMAL LOW (ref 8.9–10.3)
Chloride: 106 mmol/L (ref 98–111)
Creatinine, Ser: 0.57 mg/dL (ref 0.44–1.00)
GFR, Estimated: >60 mL/min (ref >60)
Glucose, Bld: 116 mg/dL — ABNORMAL HIGH (ref 70–99)
Potassium: 3.8 mmol/L (ref 3.5–5.1)
Sodium: 134 mmol/L — ABNORMAL LOW (ref 135–145)
Total Bilirubin: 0.9 mg/dL (ref 0.3–1.2)
Total Protein: 5.2 g/dL — ABNORMAL LOW (ref 6.5–8.1)

## 2021-03-19 LAB — CBC
HCT: 27.6 % — ABNORMAL LOW (ref 36.0–46.0)
Hemoglobin: 8.8 g/dL — ABNORMAL LOW (ref 12.0–15.0)
MCH: 30.8 pg (ref 26.0–34.0)
MCHC: 31.9 g/dL (ref 30.0–36.0)
MCV: 96.5 fL (ref 80.0–100.0)
Platelets: 147 10*3/uL — ABNORMAL LOW (ref 150–400)
RBC: 2.86 MIL/uL — ABNORMAL LOW (ref 3.87–5.11)
RDW: 16.1 % — ABNORMAL HIGH (ref 11.5–15.5)
WBC: 7.7 10*3/uL (ref 4.0–10.5)
nRBC: 0 % (ref 0.0–0.2)

## 2021-03-19 MED ORDER — BISACODYL 10 MG RE SUPP
10.0000 mg | Freq: Once | RECTAL | Status: AC
  Administered 2021-03-19: 10:00:00 10 mg via RECTAL
  Filled 2021-03-19: qty 1

## 2021-03-19 NOTE — Progress Notes (Signed)
Pharmacy Antibiotic Note  Danielle Blanchard is a 80 y.o. female admitted on 03/09/2021 with pneumonia.  Pharmacy has been consulted for Cefepime dosing.    CXR 12/15 AM read with moderate right basilar opacity is now noted concerning for atelectasis with associated pleural effusion. Patient was originally started on cefepime and vancomycin, but vancomycin discontinued on 12/17. Current WBC 7.7 and Scr stable.   Plan: Cefepime 2g IV Q12H  Monitor renal function closely and adjust as needed Determine LOT of antibiotics    Height: 5\' 2"  (157.5 cm) Weight: 70.2 kg (154 lb 12.8 oz) IBW/kg (Calculated) : 50.1  Temp (24hrs), Avg:98.7 F (37.1 C), Min:98.1 F (36.7 C), Max:99.1 F (37.3 C)  Recent Labs  Lab 03/15/21 0422 03/16/21 0630 03/17/21 0202 03/18/21 0144 03/19/21 0106  WBC 10.9* 18.0* 9.4 7.5 7.7  CREATININE 0.85 0.71 0.77 0.66 0.57  LATICACIDVEN 0.9  --   --   --   --      Estimated Creatinine Clearance: 49.7 mL/min (by C-G formula based on SCr of 0.57 mg/dL).    No Known Allergies  Antimicrobials this admission: Cefepime 12/15 >> Vancomycin 12/15 >> 12/17  Dose adjustments this admission:   Microbiology results: 12/15 UCx:  12/10 MRSA PCR: negative 12/15 MRSA PCR: negative   Thank you for allowing pharmacy to be a part of this patients care.  1/16, PharmD, MBA Pharmacy Resident [phone] 03/19/2021 9:51 AM

## 2021-03-19 NOTE — Progress Notes (Signed)
PROGRESS NOTE    Danielle Blanchard  ZSW:[id number] DOB: 01-11-1941 DOA: 03/09/2021 PCP: [facility]   Brief Narrative: 80 year old with past medical history significant for sick sinus syndrome post PPM, paroxysmal A. 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  She presented with left side chest pain 12/09, CT chest abdomen and pelvis showed large hiatal hernia with gastric body and antrum intrathoracic in location and air-fluid levels in the abdomen, unchanged from previous exam.   Patient was evaluated by GI who recommended surgical consultation no need for repeat endoscopy.  She was evaluated by surgery, he was treated conservatively with bowel rest and IV fluids.  Surgery recommended robotic XI taken down of the stomach and sac from the mediastinum with endoscopic PEG placed by Dr. Bedelia Person on 03/15/2021.    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 : -Patient recently discharged from the same month. -She was treated conservatively with bowel rest and IV fluids her symptoms improve. -Surgery evaluated patient and proceeded with robotic Xi taken down of the stomach and sac from the mediastinum with endoscopic PEG placed by Dr. Bedelia Person on 03/15/2021. -Diet advanced to Soft.  -Further management per surgery -Abdominal binder in place.  Continue with ensure.   2-Paroxysmal A. fib: Coreg and amiodarone Transition to eliquis 12/17  3-Leukocytosis:  PNA Chest x ray ; Moderate Right basilar opacity.  Started  IV antibiotics 12/16: vancomycin discontinue,. Plan for 5 days antibiotics. Last day tomorrow Incentive spirometry.  Normalized.   -HTN: Continue with Coreg.  -Sinus syndrome status post pacemaker  -Chronic Diastolic heart failure Monitor volume status.   -Hypokalemia: Replaced.   Mild transaminases; monitor. Trending down.   Nutrition Problem: Inadequate oral intake Etiology: inability to eat    Signs/Symptoms: NPO status    Interventions: Ensure Enlive (each supplement provides 350kcal and 20 grams of protein), MVI  Estimated body mass index is 28.31 kg/m as calculated from the following:   Height as of this encounter: 5\' 2"  (1.575 m).   Weight as of this encounter: 70.2 kg.   DVT prophylaxis: Heparin Gtt Code Status: DNR Family Communication: Son 12/16 Disposition Plan:  Status is: Inpatient  Remains inpatient appropriate because: SNF hopefully Monday        Consultants:  Surgery GI  Procedures:    Antimicrobials:    Subjective: She is alert, she agrees to eat more. Denies worsening pain   Objective: Vitals:   03/18/21 1819 03/18/21 2036 03/19/21 0520 03/19/21 0900  BP: (!) 144/69 (!) 147/69 (!) 140/51 (!) 128/94  Pulse: 62 61 60 (!) 57  Resp:  16 16 15   Temp:  99.1 F (37.3 C) 98.8 F (37.1 C) 98.9 F (37.2 C)  TempSrc:  Oral Oral Oral  SpO2:  100% 100% 97%  Weight:      Height:        Intake/Output Summary (Last 24 hours) at 03/19/2021 1339 Last data filed at 03/19/2021 0523 Gross per 24 hour  Intake 720 ml  Output 1050 ml  Net -330 ml    Filed Weights   03/09/21 2224 03/13/21 1123  Weight: 71.8 kg 70.2 kg    Examination:  General exam: NAD Respiratory system: CTA Cardiovascular system: S 1, S 2 RRR Gastrointestinal system: BS present, soft, nt  G tube, binder in place Central nervous system: Alert, follows command Extremities:No edema    Data Reviewed:  I have personally reviewed following labs and imaging studies  CBC: Recent Labs  Lab 03/15/21 0422 03/16/21 0630 03/17/21 0202 03/18/21 0144 03/19/21 0106  WBC 10.9* 18.0* 9.4 7.5 7.7  NEUTROABS 10.0*  --   --   --   --   HGB 11.1* 11.3* 8.8* 8.6* 8.8*  HCT 33.9* 35.5* 27.9* 27.3* 27.6*  MCV 95.8 97.3 97.6 97.5 96.5  PLT 168 174 134* 130* 147*    Basic Metabolic Panel: Recent Labs  Lab 03/13/21 0053 03/14/21 0128 03/15/21 0422 03/16/21 0630 03/17/21 0202 03/18/21 0144 03/19/21 0106  NA 140   < > 135 134* 134* 132* 134*  K 4.1   < > 4.1 4.0 3.7 3.4* 3.8  CL 107   < > 104 101 105 104 106  CO2 25   < > 22 25 24 22 24   GLUCOSE 92   < > 161* 137* 105* 141* 116*  BUN 5*   < > 10 15 16 18 12   CREATININE 0.68   < > 0.85 0.71 0.77 0.66 0.57  CALCIUM 8.8*   < > 8.6* 8.9 8.2* 7.9* 8.2*  MG 2.0  --  1.9  --   --   --   --   PHOS 3.2  --  4.5  --   --   --   --    < > = values in this interval not displayed.    GFR: Estimated Creatinine Clearance: 49.7 mL/min (by C-G formula based on SCr of 0.57 mg/dL). Liver Function Tests: Recent Labs  Lab 03/15/21 0422 03/16/21 0630 03/17/21 0202 03/18/21 0144 03/19/21 0106  AST 56* 58* 66* 38 30  ALT 45* 51* 58* 45* 38  ALKPHOS 91 110 105 117 131*  BILITOT 1.1 1.5* 1.1 1.0 0.9  PROT 6.3* 6.0* 5.0* 5.0* 5.2*  ALBUMIN 3.0* 3.1* 2.5* 2.3* 2.4*    No results for input(s): LIPASE, AMYLASE in the last 168 hours. No results for input(s): AMMONIA in the last 168 hours. Coagulation Profile: No results for input(s): INR, PROTIME in the last 168 hours. Cardiac Enzymes: No results for input(s): CKTOTAL, CKMB, CKMBINDEX, TROPONINI in the last 168 hours. BNP (last 3 results) No results for input(s): PROBNP in the last 8760 hours. HbA1C: No results for input(s): HGBA1C in the last 72 hours. CBG: No results for input(s): GLUCAP in the last 168 hours. Lipid Profile: No results for input(s): CHOL, HDL, LDLCALC, TRIG, CHOLHDL, LDLDIRECT in  the last 72 hours. Thyroid Function Tests: No results for input(s): TSH, T4TOTAL, FREET4, T3FREE, THYROIDAB in the last 72 hours. Anemia Panel: No results for input(s): VITAMINB12, FOLATE, FERRITIN, TIBC, IRON, RETICCTPCT in the last 72 hours. Sepsis Labs: Recent Labs  Lab 03/15/21 0422  LATICACIDVEN 0.9     Recent Results (from the past 240 hour(s))  Resp Panel by RT-PCR (Flu A&B, Covid) Nasopharyngeal Swab     Status: None   Collection Time: 03/10/21  1:56 AM   Specimen: Nasopharyngeal Swab; Nasopharyngeal(NP) swabs in vial transport medium  Result Value Ref Range Status   SARS Coronavirus 2 by RT PCR NEGATIVE NEGATIVE Final  Comment: (NOTE) SARS-CoV-2 target nucleic acids are NOT DETECTED.  The SARS-CoV-2 RNA is generally detectable in upper respiratory specimens during the acute phase of infection. The lowest concentration of SARS-CoV-2 viral copies this assay can detect is 138 copies/mL. A negative result does not preclude SARS-Cov-2 infection and should not be used as the sole basis for treatment or other patient management decisions. A negative result may occur with  improper specimen collection/handling, submission of specimen other than nasopharyngeal swab, presence of viral mutation(s) within the areas targeted by this assay, and inadequate number of viral copies(<138 copies/mL). A negative result must be combined with clinical observations, patient history, and epidemiological information. The expected result is Negative.  Fact Sheet for Patients:  EntrepreneurPulse.com.au  Fact Sheet for Healthcare Providers:  IncredibleEmployment.be  This test is no t yet approved or cleared by the [place] FDA and  has been authorized for detection and/or diagnosis of SARS-CoV-2 by FDA under an Emergency Use Authorization (EUA). This EUA will remain  in effect (meaning this test can be used) for the duration of the COVID-19  declaration under Section 564(b)(1) of the Act, 21 U.S.C.section 360bbb-3(b)(1), unless the authorization is terminated  or revoked sooner.       Influenza A by PCR NEGATIVE NEGATIVE Final   Influenza B by PCR NEGATIVE NEGATIVE Final    Comment: (NOTE) The Xpert Xpress SARS-CoV-2/FLU/RSV plus assay is intended as an aid in the diagnosis of influenza from Nasopharyngeal swab specimens and should not be used as a sole basis for treatment. Nasal washings and aspirates are unacceptable for Xpert Xpress SARS-CoV-2/FLU/RSV testing.  Fact Sheet for Patients: EntrepreneurPulse.com.au  Fact Sheet for Healthcare Providers: IncredibleEmployment.be  This test is not yet approved or cleared by the [place] FDA and has been authorized for detection and/or diagnosis of SARS-CoV-2 by FDA under an Emergency Use Authorization (EUA). This EUA will remain in effect (meaning this test can be used) for the duration of the COVID-19 declaration under Section 564(b)(1) of the Act, 21 U.S.C. section 360bbb-3(b)(1), unless the authorization is terminated or revoked.  Performed at [facility], [place] [address] [place], [place] [address]   Surgical pcr screen     Status: Abnormal   Collection Time: 03/11/21  6:12 AM   Specimen: Nasal Mucosa; Nasal Swab  Result Value Ref Range Status   MRSA, PCR NEGATIVE NEGATIVE Final   Staphylococcus aureus POSITIVE (A) NEGATIVE Final    Comment: (NOTE) The Xpert SA Assay (FDA approved for NASAL specimens in patients 70 years of age and older), is one component of a comprehensive surveillance program. It is not intended to diagnose infection nor to guide or monitor treatment. Performed at [facility], [place] [address], [place], [place] [address]   MRSA Next Gen by PCR, Nasal     Status: None   Collection Time: 03/16/21 10:52 AM   Specimen: Nasal Mucosa; Nasal Swab  Result Value Ref Range Status   MRSA by PCR  Next Gen NOT DETECTED NOT DETECTED Final    Comment: (NOTE) The GeneXpert MRSA Assay (FDA approved for NASAL specimens only), is one component of a comprehensive MRSA colonization surveillance program. It is not intended to diagnose MRSA infection nor to guide or monitor treatment for MRSA infections. Test performance is not FDA approved in patients less than 73 years old. Performed at [facility], [place] [address] [place], [place] [address]           Radiology Studies:  No results found.      Scheduled Meds:  acetaminophen  650 mg Oral Q6H   amiodarone  200 mg Oral Daily   apixaban  5 mg Oral BID   carvedilol  3.125 mg Oral BID WC   docusate sodium  100 mg Oral BID   feeding supplement  237 mL Oral TID BM   free water  30 mL Per Tube Daily   levothyroxine  75 mcg Oral Q0600   lidocaine  1 patch Transdermal Q24H   melatonin  3 mg Oral QHS   multivitamin with minerals  1 tablet Oral Daily   pantoprazole  40 mg Oral Daily   polyethylene glycol  17 g Oral Daily   rosuvastatin  20 mg Oral Daily   senna  1 tablet Oral Daily   Continuous Infusions:  ceFEPime (MAXIPIME) IV 2 g (03/19/21 0838)     LOS: 9 days    Time spent: 35 minutes    Chas Axel A Tahir Blank, MD [facility]   If 7PM-7AM, please contact night-coverage [email or web address]  03/19/2021, 1:39 PM

## 2021-03-19 NOTE — Progress Notes (Signed)
Progress Note  5 Days Post-Op  Subjective: Pt denies BM in a few days, not really passing flatus. Denies nausea. Reports poor appetite. Abdominal pain mild.   Objective: Vital signs in last 24 hours: Temp:  [98.1 F (36.7 C)-99.1 F (37.3 C)] 98.8 F (37.1 C) (12/18 0520) Pulse Rate:  [59-62] 60 (12/18 0520) Resp:  [16-18] 16 (12/18 0520) BP: (113-147)/(51-69) 140/51 (12/18 0520) SpO2:  [99 %-100 %] 100 % (12/18 0520) Last BM Date: 03/14/21  Intake/Output from previous day: 12/17 0701 - 12/18 0700 In: 720 [P.O.:720] Out: 1050 [Urine:1050] Intake/Output this shift: No intake/output data recorded.  PE: General: pleasant, WD, elderly female who is laying in bed in NAD Heart: regular, rate, and rhythm.   Lungs:  Respiratory effort nonlabored, lungs CTAB Abd: soft, appropriately ttp, ND, +BS, incisions c/d/I, PEG in place and clamped, binder present MS: all 4 extremities are symmetrical with no cyanosis, clubbing, or edema. Skin: warm and dry with no masses, lesions, or rashes Psych: A&Ox4 with some mild confusion regarding date of surgery    Lab Results:  Recent Labs    03/18/21 0144 03/19/21 0106  WBC 7.5 7.7  HGB 8.6* 8.8*  HCT 27.3* 27.6*  PLT 130* 147*   BMET Recent Labs    03/18/21 0144 03/19/21 0106  NA 132* 134*  K 3.4* 3.8  CL 104 106  CO2 22 24  GLUCOSE 141* 116*  BUN 18 12  CREATININE 0.66 0.57  CALCIUM 7.9* 8.2*   PT/INR No results for input(s): LABPROT, INR in the last 72 hours. CMP     Component Value Date/Time   NA 134 (L) 03/19/2021 0106   K 3.8 03/19/2021 0106   CL 106 03/19/2021 0106   CO2 24 03/19/2021 0106   GLUCOSE 116 (H) 03/19/2021 0106   BUN 12 03/19/2021 0106   CREATININE 0.57 03/19/2021 0106   CALCIUM 8.2 (L) 03/19/2021 0106   PROT 5.2 (L) 03/19/2021 0106   ALBUMIN 2.4 (L) 03/19/2021 0106   AST 30 03/19/2021 0106   ALT 38 03/19/2021 0106   ALKPHOS 131 (H) 03/19/2021 0106   BILITOT 0.9 03/19/2021 0106   GFRNONAA >60  03/19/2021 0106   GFRAA >60 12/05/2019 2254   Lipase     Component Value Date/Time   LIPASE 42 02/11/2021 2017       Studies/Results: No results found.  Anti-infectives: Anti-infectives (From admission, onward)    Start     Dose/Rate Route Frequency Ordered Stop   03/17/21 1930  vancomycin (VANCOCIN) IVPB 1000 mg/200 mL premix  Status:  Discontinued        1,000 mg 200 mL/hr over 60 Minutes Intravenous Every 24 hours 03/16/21 1054 03/18/21 1007   03/16/21 1200  vancomycin (VANCOREADY) IVPB 1500 mg/300 mL        1,500 mg 150 mL/hr over 120 Minutes Intravenous  Once 03/16/21 1054 03/16/21 2132   03/16/21 1100  ceFEPIme (MAXIPIME) 2 g in sodium chloride 0.9 % 100 mL IVPB        2 g 200 mL/hr over 30 Minutes Intravenous Every 12 hours 03/16/21 1054     03/14/21 0600  ceFAZolin (ANCEF) IVPB 2g/100 mL premix        2 g 200 mL/hr over 30 Minutes Intravenous On call to O.R. 03/13/21 1210 03/14/21 1023        Assessment/Plan Hiatal hernia  POD5 S/P robotic hiatal hernia repair with PEG placement 12/13 Dr. Daphine Deutscher - continue G-tube clamped, flush daily for tube  maintenance - abdominal binder loosely to protect PEG - tolerating soft diet  - continue scheduled tylenol and lidocaine patch for pain control, tramadol q6h prn  - add suppository today for bowel regimen  - continue to mobilize, PT/OT recommending SNF  - UOP improving and appears less concentrated - stable for discharge from a surgical standpoint when medically cleared. Follow up in [facility] already  Leukocytosis - WBC normalized, CXR with possible consolidation vs atelectasis in RLL, abx started for HCAP by primary attending - could try to get respiratory cx although patient not coughing   FEN: soft diet VTE: eliquis ID: ancef pre-op; cefepime 12/15>>, vanc 12/15>12/17 Foley: removed POD2   Sick sinus syndrome s/p pacemaker Paroxysmal A. Fib on eliquis - eliquis on hold Chronic diastolic CHF - EF is 60-65% on echo  12/9 HTN HLD GERD  LOS: 9 days    Juliet Rude, [facility] Surgery 03/19/2021, 8:38 AM Please see Amion for pager number during day hours 7:00am-4:30pm

## 2021-03-19 NOTE — TOC Progression Note (Signed)
Transition of Care [facility]) - Progression Note    Patient Details  Name: Danielle Blanchard MRN: [id number] Date of Birth: [birth year]  [facility] CM/SW Contact  Jimmy Picket, [place] Phone Number: 03/19/2021, 4:09 PM  Clinical Narrative:     CSW requested covid test for possible DC tomorrow.   Expected Discharge Plan: Skilled Nursing Facility Barriers to Discharge: English as a second language teacher, Continued Medical Work up  Expected Discharge Plan and Services Expected Discharge Plan: Skilled Nursing Facility       Living arrangements for the past 2 months: Skilled Nursing Facility                                       Social Determinants of Health (SDOH) Interventions    Readmission Risk Interventions No flowsheet data found.  Jimmy Picket, LCSW Clinical Social Worker

## 2021-03-20 ENCOUNTER — Inpatient Hospital Stay (HOSPITAL_COMMUNITY): Payer: Medicare Other

## 2021-03-20 LAB — CBC
HCT: 28.6 % — ABNORMAL LOW (ref 36.0–46.0)
Hemoglobin: 9.1 g/dL — ABNORMAL LOW (ref 12.0–15.0)
MCH: 31 pg (ref 26.0–34.0)
MCHC: 31.8 g/dL (ref 30.0–36.0)
MCV: 97.3 fL (ref 80.0–100.0)
Platelets: 166 10*3/uL (ref 150–400)
RBC: 2.94 MIL/uL — ABNORMAL LOW (ref 3.87–5.11)
RDW: 16.3 % — ABNORMAL HIGH (ref 11.5–15.5)
WBC: 7.6 10*3/uL (ref 4.0–10.5)
nRBC: 0.3 % — ABNORMAL HIGH (ref 0.0–0.2)

## 2021-03-20 LAB — COMPREHENSIVE METABOLIC PANEL
ALT: 36 U/L (ref 0–44)
AST: 41 U/L (ref 15–41)
Albumin: 2.3 g/dL — ABNORMAL LOW (ref 3.5–5.0)
Alkaline Phosphatase: 138 U/L — ABNORMAL HIGH (ref 38–126)
Anion gap: 5 (ref 5–15)
BUN: 15 mg/dL (ref 8–23)
CO2: 26 mmol/L (ref 22–32)
Calcium: 8.2 mg/dL — ABNORMAL LOW (ref 8.9–10.3)
Chloride: 104 mmol/L (ref 98–111)
Creatinine, Ser: 0.61 mg/dL (ref 0.44–1.00)
GFR, Estimated: >60 mL/min (ref >60)
Glucose, Bld: 103 mg/dL — ABNORMAL HIGH (ref 70–99)
Potassium: 4.3 mmol/L (ref 3.5–5.1)
Sodium: 135 mmol/L (ref 135–145)
Total Bilirubin: 0.9 mg/dL (ref 0.3–1.2)
Total Protein: 5.3 g/dL — ABNORMAL LOW (ref 6.5–8.1)

## 2021-03-20 LAB — URINE CULTURE: Culture: NO GROWTH

## 2021-03-20 LAB — SARS CORONAVIRUS 2 (TAT 6-24 HRS): SARS Coronavirus 2: NEGATIVE

## 2021-03-20 MED ORDER — MINERAL OIL RE ENEM
1.0000 | ENEMA | Freq: Once | RECTAL | Status: AC
  Administered 2021-03-20: 16:00:00 1 via RECTAL
  Filled 2021-03-20 (×2): qty 1

## 2021-03-20 MED ORDER — BISACODYL 10 MG RE SUPP
10.0000 mg | Freq: Once | RECTAL | Status: AC
  Administered 2021-03-20: 11:00:00 10 mg via RECTAL
  Filled 2021-03-20: qty 1

## 2021-03-20 MED ORDER — BISACODYL 10 MG RE SUPP
10.0000 mg | Freq: Every day | RECTAL | Status: DC | PRN
  Administered 2021-03-21: 09:00:00 10 mg via RECTAL
  Filled 2021-03-20: qty 1

## 2021-03-20 NOTE — TOC Progression Note (Signed)
Transition of Care [facility]) - Progression Note    Patient Details  Name: Danitza Schoenfeldt MRN: [id number] Date of Birth: [birth year]  Transition of Care [facility]) CM/SW Contact  Jimmy Picket, [place] Phone Number: 03/20/2021, 4:11 PM  Clinical Narrative:     Pts insurance Berkley Harvey has been received. Per MD pt should DC to [facility] place tomorrow.   Expected Discharge Plan: Skilled Nursing Facility Barriers to Discharge: English as a second language teacher, Continued Medical Work up  Expected Discharge Plan and Services Expected Discharge Plan: Skilled Nursing Facility       Living arrangements for the past 2 months: Skilled Nursing Facility                                       Social Determinants of Health (SDOH) Interventions    Readmission Risk Interventions No flowsheet data found.  Jimmy Picket, LCSW Clinical Social Worker

## 2021-03-20 NOTE — Progress Notes (Signed)
Pt had 2 small bowel movements this pm after having a suppository. Also had an even smaller BM following the enema administration this pm

## 2021-03-20 NOTE — Progress Notes (Signed)
Occupational Therapy Treatment Patient Details Name: Danielle Blanchard MRN: [id number] DOB: 1941-03-25 Today's Date: 03/20/2021   History of present illness 80 y.o female admitted 12/8  with left-sided chest pain. CT chest/abdomen/pelvis showing large hiatal hernia.  Pt underwent G-tube placement and hernia repair on 12/13.  PMH:  sick sinus syndrome post PPM, chronic diastolic CHF, hypertension, hyperlipidemia, GERD.  Recently admitted on 11/12 for left upper quadrant abdominal pain.  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  Patient was seen by GI and underwent decompression by endoscopy on 11/14.   OT comments  Pt is slowly progressing towards OT goals. During session, pt ambulated to bathroom with Min guard and completed toilet transfer with Min A. Pt also completed pericare in standing with Min guard. Will continue to follow.   Recommendations for follow up therapy are one component of a multi-disciplinary discharge planning process, led by the attending physician.  Recommendations may be updated based on patient status, additional functional criteria and insurance authorization.    Follow Up Recommendations  Skilled nursing-short term rehab (<3 hours/day)    Assistance Recommended at Discharge Intermittent Supervision/Assistance  Equipment Recommendations  None recommended by OT;Other (comment)    Recommendations for Other Services      Precautions / Restrictions Precautions Precautions: Fall Precaution Comments: abdominal pain Restrictions Weight Bearing Restrictions: No       Mobility Bed Mobility Overal bed mobility: Needs Assistance Bed Mobility: Supine to Sit     Supine to sit: Min assist Sit to supine: Min assist        Transfers Overall transfer level: Needs assistance Equipment used: Rolling walker (2 wheels) Transfers: Sit to/from Stand Sit to Stand: Min assist                 Balance Overall balance assessment:  Needs assistance Sitting-balance support: Feet supported Sitting balance-Leahy Scale: Fair     Standing balance support: Bilateral upper extremity supported Standing balance-Leahy Scale: Poor                             ADL either performed or assessed with clinical judgement   ADL Overall ADL's : Needs assistance/impaired                         Toilet Transfer: Minimal assistance;Ambulation;Rolling walker (2 wheels);Regular Toilet   Toileting- Architect and Hygiene: Min guard;Sit to/from stand       Functional mobility during ADLs: Minimal assistance;Cueing for safety;Cueing for sequencing;Rolling walker (2 wheels)      Extremity/Trunk Assessment              Vision       Perception     Praxis      Cognition Arousal/Alertness: Awake/alert Behavior During Therapy: WFL for tasks assessed/performed Overall Cognitive Status: History of cognitive impairments - at baseline                                            Exercises     Shoulder Instructions       General Comments      Pertinent Vitals/ Pain       Pain Assessment: 0-10 Pain Score: 3  Pain Location: abdomen Pain Descriptors / Indicators: Aching;Constant;Discomfort;Cramping;Grimacing Pain Intervention(s): Monitored during session  Home Living  Prior Functioning/Environment              Frequency  Min 2X/week        Progress Toward Goals  OT Goals(current goals can now be found in the care plan section)  Progress towards OT goals: Progressing toward goals  Acute Rehab OT Goals Patient Stated Goal: none stated OT Goal Formulation: With patient Time For Goal Achievement: 03/24/21 Potential to Achieve Goals: Good ADL Goals Pt Will Perform Grooming: with min guard assist;standing Pt Will Perform Lower Body Bathing: sit to/from stand;with min guard assist Pt Will Perform  Lower Body Dressing: with min guard assist;sit to/from stand Pt Will Transfer to Toilet: with min guard assist;ambulating;regular height toilet Pt Will Perform Toileting - Clothing Manipulation and hygiene: with min guard assist;sit to/from stand  Plan Discharge plan remains appropriate;Frequency remains appropriate    Co-evaluation                 AM-PAC OT "6 Clicks" Daily Activity     Outcome Measure   Help from another person eating meals?: None Help from another person taking care of personal grooming?: A Little Help from another person toileting, which includes using toliet, bedpan, or urinal?: A Little Help from another person bathing (including washing, rinsing, drying)?: A Lot Help from another person to put on and taking off regular upper body clothing?: A Lot Help from another person to put on and taking off regular lower body clothing?: A Lot 6 Click Score: 16    End of Session Equipment Utilized During Treatment: Rolling walker (2 wheels);Oxygen  OT Visit Diagnosis: Unsteadiness on feet (R26.81)   Activity Tolerance Patient tolerated treatment well   Patient Left in bed;with call bell/phone within reach;with nursing/sitter in room   Nurse Communication Mobility status        Time: 6438-3818 OT Time Calculation (min): 17 min  Charges: OT General Charges $OT Visit: 1 Visit OT Treatments $Self Care/Home Management : 8-22 mins  Danielle Blanchard, OT/L  Acute Rehab [phone]  Danielle Blanchard 03/20/2021, 5:45 PM

## 2021-03-20 NOTE — Progress Notes (Addendum)
PROGRESS NOTE    Danielle Blanchard  UEA:[id number] DOB: May 13, 1940 DOA: 03/09/2021 PCP: [facility]   Brief Narrative: 80 year old with past medical history significant for sick sinus syndrome post PPM, paroxysmal A. 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  She presented with left side chest pain 12/09, CT chest abdomen and pelvis showed large hiatal hernia with gastric body and antrum intrathoracic in location and air-fluid levels in the abdomen, unchanged from previous exam.   Patient was evaluated by GI who recommended surgical consultation no need for repeat endoscopy.  She was evaluated by surgery, he was treated conservatively with bowel rest and IV fluids.  Surgery recommended robotic XI taken down of the stomach and sac from the mediastinum with endoscopic PEG placed by Dr. Bedelia PersonLovick on 03/15/2021.    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 : -Patient recently discharged from the same month. -She was treated conservatively with bowel rest and IV fluids her symptoms improve. -Surgery evaluated patient and proceeded with robotic Xi taken down of the stomach and sac from the mediastinum with endoscopic PEG placed by Dr. Bedelia PersonLovick on 03/15/2021. -Diet advanced to Soft.  -Further management per surgery -Abdominal binder in place.  Continue with ensure.  Not eating, does not have appetite. No BM. Will get KUB and order suppository   2-Paroxysmal  A. fib: Coreg and amiodarone Transition to eliquis 12/17  3-Leukocytosis: PNA Chest x ray ; Moderate Right basilar opacity.  Started  IV antibiotics 12/16: vancomycin discontinue,. Plan for 5 days antibiotics. Last day today.  Incentive spirometry.  Normalized.   -HTN: Continue with Coreg.  -Sinus syndrome status post pacemaker  -Chronic Diastolic heart failure Monitor volume status.   -Hypokalemia: Replaced.   Mild transaminases; monitor. Trending down.   Nutrition Problem: Inadequate oral intake Etiology: inability to eat    Signs/Symptoms: NPO status    Interventions: Ensure Enlive (each supplement provides 350kcal and 20 grams of protein), MVI  Estimated body mass index is 28.31 kg/m as calculated from the following:   Height as of this encounter: 5\' 2"  (1.575 m).   Weight as of this encounter: 70.2 kg.   DVT prophylaxis: Heparin Gtt Code Status: DNR Family Communication: Son 12/19 Disposition Plan:  Status is: Inpatient  Remains inpatient appropriate because: SNF hopefully tomorrow.        Consultants:  Surgery GI  Procedures:    Antimicrobials:    Subjective: She is not feeling well, report poor oral intake. No BM, doesn't like food.   Objective: Vitals:   03/19/21 1738 03/19/21 1953 03/20/21 0603 03/20/21 0745  BP: (!) 111/53 (!) 137/55 (!) 136/52 137/62  Pulse: 60 (!) 59 60 60  Resp: 16 20  16   Temp: 98.2 F (36.8 C) 98.1 F (36.7 C) 98.6 F (37 C) 98.2 F (36.8 C)  TempSrc: Oral Oral Oral Oral  SpO2: 98% 98% 99% 100%  Weight:      Height:        Intake/Output Summary (Last 24 hours)  at 03/20/2021 1149 Last data filed at 03/20/2021 1119 Gross per 24 hour  Intake 240 ml  Output 1050 ml  Net -810 ml    Filed Weights   03/09/21 2224 03/13/21 1123  Weight: 71.8 kg 70.2 kg    Examination:  General exam: NAD Respiratory system: CTA Cardiovascular system: S 1, S 2 RRR Gastrointestinal system: BS present, soft,  nt  G  tube, binder in place Central nervous system: Alert, follows command Extremities:No edema    Data Reviewed: I have personally reviewed following labs and imaging studies  CBC: Recent Labs  Lab 03/15/21 0422 03/16/21 0630 03/17/21 0202 03/18/21 0144 03/19/21 0106 03/20/21 0046  WBC 10.9* 18.0* 9.4 7.5 7.7 7.6  NEUTROABS 10.0*  --   --   --   --   --   HGB 11.1* 11.3* 8.8* 8.6* 8.8* 9.1*  HCT 33.9* 35.5* 27.9* 27.3* 27.6* 28.6*  MCV 95.8 97.3 97.6 97.5 96.5 97.3  PLT 168 174 134* 130* 147* [id number]    Basic Metabolic Panel: Recent Labs  Lab 03/15/21 0422 03/16/21 0630 03/17/21 0202 03/18/21 0144 03/19/21 0106 03/20/21 0046  NA 135 134* 134* 132* 134* 135  K 4.1 4.0 3.7 3.4* 3.8 4.3  CL 104 101 105 104 106 104  CO2 22 25 24 22 24 26   GLUCOSE 161* 137* 105* 141* 116* 103*  BUN 10 15 16 18 12 15   CREATININE 0.85 0.71 0.77 0.66 0.57 0.61  CALCIUM 8.6* 8.9 8.2* 7.9* 8.2* 8.2*  MG 1.9  --   --   --   --   --   PHOS 4.5  --   --   --   --   --     GFR: Estimated Creatinine Clearance: 49.7 mL/min (by C-G formula based on SCr of 0.61 mg/dL). Liver Function Tests: Recent Labs  Lab 03/16/21 0630 03/17/21 0202 03/18/21 0144 03/19/21 0106 03/20/21 0046  AST 58* 66* 38 30 41  ALT 51* 58* 45* 38 36  ALKPHOS 110 105 117 131* 138*  BILITOT 1.5* 1.1 1.0 0.9 0.9  PROT 6.0* 5.0* 5.0* 5.2* 5.3*  ALBUMIN 3.1* 2.5* 2.3* 2.4* 2.3*    No results for input(s): LIPASE, AMYLASE in the last 168 hours. No results for input(s): AMMONIA in the last 168 hours. Coagulation Profile: No results for input(s): INR, PROTIME in the last 168 hours. Cardiac Enzymes: No results for input(s): CKTOTAL, CKMB, CKMBINDEX, TROPONINI in the last 168 hours. BNP (last 3 results) No results for input(s): PROBNP in the last 8760 hours. HbA1C: No results for input(s): HGBA1C in the last 72 hours. CBG: No results for input(s): GLUCAP in the last 168 hours. Lipid Profile: No results for input(s): CHOL,  HDL, LDLCALC, TRIG, CHOLHDL, LDLDIRECT in the last 72 hours. Thyroid Function Tests: No results for input(s): TSH, T4TOTAL, FREET4, T3FREE, THYROIDAB in the last 72 hours. Anemia Panel: No results for input(s): VITAMINB12, FOLATE, FERRITIN, TIBC, IRON, RETICCTPCT in the last 72 hours. Sepsis Labs: Recent Labs  Lab 03/15/21 0422  LATICACIDVEN 0.9     Recent Results (from the past 240 hour(s))  Surgical pcr screen     Status: Abnormal   Collection Time: 03/11/21  6:12 AM   Specimen: Nasal Mucosa; Nasal Swab  Result Value Ref Range Status   MRSA, PCR NEGATIVE NEGATIVE Final   Staphylococcus aureus POSITIVE (A) NEGATIVE Final    Comment: (NOTE) The Xpert SA Assay (FDA approved for NASAL specimens in patients 56 years of age and  older), is one component of a comprehensive surveillance program. It is not intended to diagnose infection nor to guide or monitor treatment. Performed at [facility], [address] [place], [place] [address]   MRSA Next Gen by PCR, Nasal     Status: None   Collection Time: 03/16/21 10:52 AM   Specimen: Nasal Mucosa; Nasal Swab  Result Value Ref Range Status   MRSA by PCR Next Gen NOT DETECTED NOT DETECTED Final    Comment: (NOTE) The GeneXpert MRSA Assay (FDA approved for NASAL specimens only), is one component of a comprehensive MRSA colonization surveillance program. It is not intended to diagnose MRSA infection nor to guide or monitor treatment for MRSA infections. Test performance is not FDA approved in patients less than 73 years old. Performed at [facility], [address] [place], [place] [address]   Urine Culture     Status: None   Collection Time: 03/19/21  2:00 AM   Specimen: Urine, Clean Catch  Result Value Ref Range Status   Specimen Description URINE, CLEAN CATCH  Final   Special Requests NONE  Final   Culture   Final    NO GROWTH Performed at [facility], [address] [place], [place] [address]     Report Status 03/20/2021 FINAL  Final  SARS CORONAVIRUS 2 (TAT 6-24 HRS) Nasopharyngeal Nasopharyngeal Swab     Status: None   Collection Time: 03/19/21  5:32 PM   Specimen: Nasopharyngeal Swab  Result Value Ref Range Status   SARS Coronavirus 2 NEGATIVE NEGATIVE Final    Comment: (NOTE) SARS-CoV-2 target nucleic acids are NOT DETECTED.  The SARS-CoV-2 RNA is generally detectable in upper and lower respiratory specimens during the acute phase of infection. Negative results do not preclude SARS-CoV-2 infection, do not rule out co-infections with other pathogens, and should not be used as the sole basis for treatment or other patient management decisions. Negative results must be combined with clinical observations, patient history, and epidemiological information. The expected result is Negative.  Fact Sheet for Patients: HairSlick.no  Fact Sheet for Healthcare Providers: quierodirigir.com  This test is not yet approved or cleared by the [place] FDA and  has been authorized for detection and/or diagnosis of SARS-CoV-2 by FDA under an Emergency Use Authorization (EUA). This EUA will remain  in effect (meaning this test can be used) for the duration of the COVID-19 declaration under Se ction 564(b)(1) of the Act, 21 U.S.C. section 360bbb-3(b)(1), unless the authorization is terminated or revoked sooner.  Performed at [facility], [address], [place], [place] [address]           Radiology Studies: No results found.      Scheduled Meds:  acetaminophen  650 mg Oral Q6H   amiodarone  200 mg Oral Daily   apixaban  5 mg Oral BID   carvedilol  3.125 mg Oral BID WC   docusate sodium  100 mg Oral BID   feeding supplement  237 mL Oral TID BM   free water  30 mL Per Tube Daily   levothyroxine  75 mcg Oral Q0600   lidocaine  1 patch Transdermal Q24H   melatonin  3 mg Oral QHS   multivitamin with  minerals  1 tablet Oral Daily   pantoprazole  40 mg Oral Daily   polyethylene glycol  17 g Oral Daily   rosuvastatin  20 mg Oral Daily   senna  1 tablet  Oral Daily   Continuous Infusions:  ceFEPime (MAXIPIME) IV 2 g (03/20/21 0831)     LOS: 10 days    Time spent: 35 minutes    Camp Gopal A Aston Lieske, MD [facility]   If 7PM-7AM, please contact night-coverage [email or web address]  03/20/2021, 11:49 AM

## 2021-03-20 NOTE — Progress Notes (Signed)
Progress Note  6 Days Post-Op  Subjective: No specific complaints, just generally doesn't feel great. Tolerating PO.   Objective: Vital signs in last 24 hours: Temp:  [98.1 F (36.7 C)-98.9 F (37.2 C)] 98.2 F (36.8 C) (12/19 0745) Pulse Rate:  [57-60] 60 (12/19 0745) Resp:  [15-20] 16 (12/19 0745) BP: (111-137)/(52-94) 137/62 (12/19 0745) SpO2:  [97 %-100 %] 100 % (12/19 0745) Last BM Date: 03/14/21  Intake/Output from previous day: 12/18 0701 - 12/19 0700 In: -  Out: 1050 [Urine:1050] Intake/Output this shift: No intake/output data recorded.  PE: General: pleasant, WD, elderly female who is laying in bed in NAD Heart: regular, rate, and rhythm.   Lungs:  Respiratory effort nonlabored, lungs CTAB Abd: soft, appropriately ttp, ND, +BS, incisions c/d/I, PEG in place and clamped, binder present MS: all 4 extremities are symmetrical with no cyanosis, clubbing, or edema. Skin: warm and dry with no masses, lesions, or rashes Psych: A&Ox4 with some mild confusion regarding date of surgery    Lab Results:  Recent Labs    03/19/21 0106 03/20/21 0046  WBC 7.7 7.6  HGB 8.8* 9.1*  HCT 27.6* 28.6*  PLT 147* 166    BMET Recent Labs    03/19/21 0106 03/20/21 0046  NA 134* 135  K 3.8 4.3  CL 106 104  CO2 24 26  GLUCOSE 116* 103*  BUN 12 15  CREATININE 0.57 0.61  CALCIUM 8.2* 8.2*    PT/INR No results for input(s): LABPROT, INR in the last 72 hours. CMP     Component Value Date/Time   NA 135 03/20/2021 0046   K 4.3 03/20/2021 0046   CL 104 03/20/2021 0046   CO2 26 03/20/2021 0046   GLUCOSE 103 (H) 03/20/2021 0046   BUN 15 03/20/2021 0046   CREATININE 0.61 03/20/2021 0046   CALCIUM 8.2 (L) 03/20/2021 0046   PROT 5.3 (L) 03/20/2021 0046   ALBUMIN 2.3 (L) 03/20/2021 0046   AST 41 03/20/2021 0046   ALT 36 03/20/2021 0046   ALKPHOS 138 (H) 03/20/2021 0046   BILITOT 0.9 03/20/2021 0046   GFRNONAA >60 03/20/2021 0046   GFRAA >60 12/05/2019 2254    Lipase     Component Value Date/Time   LIPASE 42 02/11/2021 2017       Studies/Results: No results found.  Anti-infectives: Anti-infectives (From admission, onward)    Start     Dose/Rate Route Frequency Ordered Stop   03/17/21 1930  vancomycin (VANCOCIN) IVPB 1000 mg/200 mL premix  Status:  Discontinued        1,000 mg 200 mL/hr over 60 Minutes Intravenous Every 24 hours 03/16/21 1054 03/18/21 1007   03/16/21 1200  vancomycin (VANCOREADY) IVPB 1500 mg/300 mL        1,500 mg 150 mL/hr over 120 Minutes Intravenous  Once 03/16/21 1054 03/16/21 2132   03/16/21 1100  ceFEPIme (MAXIPIME) 2 g in sodium chloride 0.9 % 100 mL IVPB        2 g 200 mL/hr over 30 Minutes Intravenous Every 12 hours 03/16/21 1054     03/14/21 0600  ceFAZolin (ANCEF) IVPB 2g/100 mL premix        2 g 200 mL/hr over 30 Minutes Intravenous On call to O.R. 03/13/21 1210 03/14/21 1023        Assessment/Plan Hiatal hernia  POD6 S/P robotic hiatal hernia repair with PEG placement 12/13 Dr. Daphine Deutscher - continue G-tube clamped, flush daily for tube maintenance - abdominal binder loosely to protect PEG -  tolerating soft diet  - continue scheduled tylenol and lidocaine patch for pain control, tramadol q6h prn  - add suppository today for bowel regimen  - continue to mobilize, PT/OT recommending SNF  - UOP improving and appears less concentrated - stable for discharge from a surgical standpoint when medically cleared. Follow up in [facility] already  Leukocytosis - WBC normalized, CXR with possible consolidation vs atelectasis in RLL, abx started for HCAP by primary attending    FEN: soft diet VTE: eliquis ID: ancef pre-op; cefepime 12/15>>, vanc 12/15>12/17 Foley: removed POD2   Sick sinus syndrome s/p pacemaker Paroxysmal A. Fib on eliquis - eliquis on hold Chronic diastolic CHF - EF is 60-65% on echo 12/9 HTN HLD GERD  LOS: 10 days    Berna Bue, MD [facility] Surgery 03/20/2021, 8:36  AM Please see Amion for pager number during day hours 7:00am-4:30pm

## 2021-03-20 NOTE — Progress Notes (Signed)
Physical Therapy Treatment Patient Details Name: Danielle Blanchard MRN: [id number] DOB: 1940-07-02 Today's Date: 03/20/2021   History of Present Illness 80 y.o female admitted 12/8  with left-sided chest pain. CT chest/abdomen/pelvis showing large hiatal hernia.  Pt underwent G-tube placement and hernia repair on 12/13.  PMH:  sick sinus syndrome post PPM, chronic diastolic CHF, hypertension, hyperlipidemia, GERD.  Recently admitted on 11/12 for left upper quadrant abdominal pain.  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  Patient was seen by GI and underwent decompression by endoscopy on 11/14.    PT Comments    Pt admitted with above diagnosis. Pt was able to ambulate with RW and continues to need 24 hour care as her endurance is poor and she fatigues very quickly. Pt requires min to mod assist for safety. Will continue to follow acutely.  Pt currently with functional limitations due to balance and endurance deficits. Pt will benefit from skilled PT to increase their independence and safety with mobility to allow discharge to the venue listed below.      Recommendations for follow up therapy are one component of a multi-disciplinary discharge planning process, led by the attending physician.  Recommendations may be updated based on patient status, additional functional criteria and insurance authorization.  Follow Up Recommendations  Skilled nursing-short term rehab (<3 hours/day)     Assistance Recommended at Discharge Frequent or constant Supervision/Assistance  Equipment Recommendations   (TBD at next venue)    Recommendations for Other Services       Precautions / Restrictions Precautions Precautions: Fall Precaution Comments: abdominal pain Restrictions Weight Bearing Restrictions: No     Mobility  Bed Mobility               General bed mobility comments: in recliner on arrival    Transfers Overall transfer level: Needs  assistance Equipment used: Rolling walker (2 wheels) Transfers: Sit to/from Stand;Bed to chair/wheelchair/BSC Sit to Stand: Min assist;Mod assist           General transfer comment: increased time and cues for hand placement.  Difficulty for pt to stand upright even with cues    Ambulation/Gait Ambulation/Gait assistance: Min assist Gait Distance (Feet): 70 Feet Assistive device: Rolling walker (2 wheels);None Gait Pattern/deviations: Step-through pattern;Decreased stride length;Trunk flexed;Wide base of support;Antalgic Gait velocity: decreased Gait velocity interpretation: <1.31 ft/sec, indicative of household ambulator   General Gait Details: Pt needs constant cues to stand tall as she tends to flex and flexion of trunk increases as she fatigues. Needed assist and cues to steer RW as well.  Needed incr cues to square her body up to recliner upon return to chair.   Stairs             Wheelchair Mobility    Modified Rankin (Stroke Patients Only)       Balance Overall balance assessment: Needs assistance Sitting-balance support: Feet supported Sitting balance-Leahy Scale: Fair Sitting balance - Comments: pt leaning to the L due to pain   Standing balance support: Bilateral upper extremity supported Standing balance-Leahy Scale: Poor Standing balance comment: dependent on RW and external support                            Cognition Arousal/Alertness: Awake/alert Behavior During Therapy: WFL for tasks assessed/performed Overall Cognitive Status: History of cognitive impairments - at baseline  Exercises General Exercises - Lower Extremity Ankle Circles/Pumps: AROM;Both;10 reps;Supine Long Arc Quad: AROM;Both;10 reps;Seated    General Comments General comments (skin integrity, edema, etc.): Pt sats on RA 87% therefore replaced O2 at 2L and sats 93%.      Pertinent Vitals/Pain Pain  Assessment: Faces Faces Pain Scale: Hurts little more Pain Location: abdomen Pain Descriptors / Indicators: Aching;Constant;Discomfort;Cramping;Grimacing Pain Intervention(s): Limited activity within patient's tolerance;Monitored during session;Repositioned    Home Living                          Prior Function            PT Goals (current goals can now be found in the care plan section) Progress towards PT goals: Progressing toward goals    Frequency    Min 2X/week      PT Plan Frequency needs to be updated    Co-evaluation              AM-PAC PT "6 Clicks" Mobility   Outcome Measure  Help needed turning from your back to your side while in a flat bed without using bedrails?: A Little Help needed moving from lying on your back to sitting on the side of a flat bed without using bedrails?: A Little Help needed moving to and from a bed to a chair (including a wheelchair)?: A Little Help needed standing up from a chair using your arms (e.g., wheelchair or bedside chair)?: A Lot Help needed to walk in hospital room?: A Lot Help needed climbing 3-5 steps with a railing? : Total 6 Click Score: 14    End of Session Equipment Utilized During Treatment: Gait belt;Oxygen Activity Tolerance: Patient limited by pain;Patient limited by fatigue Patient left: in chair;with call bell/phone within reach;with chair alarm set Nurse Communication: Mobility status PT Visit Diagnosis: Unsteadiness on feet (R26.81)     Time: 1211-1225 PT Time Calculation (min) (ACUTE ONLY): 14 min  Charges:  $Gait Training: 8-22 mins                     Josha Weekley M,PT Acute Rehab Services [phone] (pager)    Bevelyn Buckles 03/20/2021, 12:54 PM

## 2021-03-20 NOTE — Progress Notes (Signed)
Mobility Specialist Progress Note:   03/20/21 1145  Mobility  Activity Ambulated in hall  Level of Assistance Minimal assist, patient does 75% or more  Assistive Device Front wheel walker  Distance Ambulated (ft) 120 ft  Mobility Ambulated with assistance in hallway;Sit up in bed/chair position for meals  Mobility Response Tolerated well  Mobility performed by Mobility specialist  Bed Position Chair  $Mobility charge 1 Mobility    Pt with mod LUQ pain throughout session. Required HHA to get EOB, minA to stand, minG for ambulation. Pt also required ques to stand up straight throughout ambulation. Up in chair with chair alarm on.  Addison Lank Mobility Specialist  Phone [phone]

## 2021-03-21 LAB — COMPREHENSIVE METABOLIC PANEL
ALT: 32 U/L (ref 0–44)
AST: 34 U/L (ref 15–41)
Albumin: 2.4 g/dL — ABNORMAL LOW (ref 3.5–5.0)
Alkaline Phosphatase: 129 U/L — ABNORMAL HIGH (ref 38–126)
Anion gap: 3 — ABNORMAL LOW (ref 5–15)
BUN: 12 mg/dL (ref 8–23)
CO2: 30 mmol/L (ref 22–32)
Calcium: 8.5 mg/dL — ABNORMAL LOW (ref 8.9–10.3)
Chloride: 103 mmol/L (ref 98–111)
Creatinine, Ser: 0.62 mg/dL (ref 0.44–1.00)
GFR, Estimated: >60 mL/min (ref >60)
Glucose, Bld: 101 mg/dL — ABNORMAL HIGH (ref 70–99)
Potassium: 4 mmol/L (ref 3.5–5.1)
Sodium: 136 mmol/L (ref 135–145)
Total Bilirubin: 0.8 mg/dL (ref 0.3–1.2)
Total Protein: 5.3 g/dL — ABNORMAL LOW (ref 6.5–8.1)

## 2021-03-21 LAB — CBC
HCT: 27.4 % — ABNORMAL LOW (ref 36.0–46.0)
Hemoglobin: 9 g/dL — ABNORMAL LOW (ref 12.0–15.0)
MCH: 31.8 pg (ref 26.0–34.0)
MCHC: 32.8 g/dL (ref 30.0–36.0)
MCV: 96.8 fL (ref 80.0–100.0)
Platelets: 171 10*3/uL (ref 150–400)
RBC: 2.83 MIL/uL — ABNORMAL LOW (ref 3.87–5.11)
RDW: 16.2 % — ABNORMAL HIGH (ref 11.5–15.5)
WBC: 8 10*3/uL (ref 4.0–10.5)
nRBC: 0.3 % — ABNORMAL HIGH (ref 0.0–0.2)

## 2021-03-21 MED ORDER — PANTOPRAZOLE SODIUM 40 MG PO TBEC: 40.0000 mg | DELAYED_RELEASE_TABLET | Freq: Every day | ORAL | 0 refills | Status: DC

## 2021-03-21 MED ORDER — TRAMADOL HCL 50 MG PO TABS
50.0000 mg | ORAL_TABLET | Freq: Four times a day (QID) | ORAL | 0 refills | Status: AC | PRN
Start: 2021-03-21 — End: 2021-03-26

## 2021-03-21 MED ORDER — LACTULOSE 10 GM/15ML PO SOLN
20.0000 g | Freq: Every day | ORAL | Status: DC
  Administered 2021-03-21: 09:00:00 20 g via ORAL
  Filled 2021-03-21: qty 30

## 2021-03-21 MED ORDER — BISACODYL 10 MG RE SUPP: 10.0000 mg | Freq: Every day | RECTAL | 0 refills | Status: DC | PRN

## 2021-03-21 MED ORDER — LIDOCAINE 5 % EX PTCH: 1.0000 | MEDICATED_PATCH | CUTANEOUS | 0 refills | Status: DC

## 2021-03-21 MED ORDER — ENSURE ENLIVE PO LIQD
237.0000 mL | Freq: Four times a day (QID) | ORAL | Status: DC
  Administered 2021-03-21: 09:00:00 237 mL via ORAL

## 2021-03-21 MED ORDER — POLYETHYLENE GLYCOL 3350 17 G PO PACK: 17.0000 g | PACK | Freq: Every day | ORAL | 0 refills | Status: DC

## 2021-03-21 MED ORDER — DOCUSATE SODIUM 100 MG PO CAPS: 100.0000 mg | ORAL_CAPSULE | Freq: Two times a day (BID) | ORAL | 0 refills | Status: AC

## 2021-03-21 MED ORDER — LACTULOSE 10 GM/15ML PO SOLN: 20.0000 g | Freq: Every day | ORAL | 0 refills | Status: DC

## 2021-03-21 MED ORDER — FREE WATER: 30.0000 mL | Freq: Every day | 0 refills | Status: DC

## 2021-03-21 MED ORDER — ENSURE ENLIVE PO LIQD: 237.0000 mL | Freq: Four times a day (QID) | ORAL | 12 refills | Status: DC

## 2021-03-21 MED ORDER — SENNA 8.6 MG PO TABS: 1.0000 | ORAL_TABLET | Freq: Every day | ORAL | 0 refills | Status: DC

## 2021-03-21 NOTE — Progress Notes (Signed)
Mobility Specialist Progress Note:   03/21/21 1100  Mobility  Activity Ambulated in hall  Level of Assistance Minimal assist, patient does 75% or more  Assistive Device Front wheel walker  Distance Ambulated (ft) 170 ft  Mobility Ambulated with assistance in hallway  Mobility Response Tolerated well  Mobility performed by Mobility specialist  $Mobility charge 1 Mobility   Pt required HHA to get EOB, minA to stand, and min guard for ambulation. Distance limited secondary to fatigue. Pt back in bed with bed alarm on.   Addison Lank Mobility Specialist  Phone [phone]

## 2021-03-21 NOTE — Discharge Summary (Addendum)
Physician Discharge Summary  Danielle Blanchard [id number] DOB: Feb 18, 1941 DOA: 03/09/2021  [facility] [place] date: 03/09/2021 Discharge date: 03/21/2021  Admitted From: Home  Disposition:  SNF.   Recommendations for Outpatient Follow-up:  Follow up with PCP in 1-2 weeks Please obtain BMP/CBC in one week Follow up with Sx on 04/12/2021 Encourage oral intake.     Discharge Condition: Stable.  CODE STATUS: DNR Diet recommendation: Heart Healthy   Brief/Interim Summary: 80 year old with past medical history significant for sick sinus syndrome post PPM, paroxysmal A. 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  She presented with left side chest pain 12/09, CT chest abdomen and pelvis showed large hiatal hernia with gastric body and antrum intrathoracic in location and air-fluid levels in the abdomen, unchanged from previous exam.    Patient was evaluated by GI who recommended surgical consultation no need for repeat endoscopy.  She was evaluated by surgery, he was treated conservatively with bowel rest and IV fluids.  Surgery recommended robotic XI taken down of the stomach and sac from the mediastinum with endoscopic PEG placed by Dr. Bobbye Morton on 03/15/2021.   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 : -Patient recently discharged from the same month. -She was treated conservatively with bowel rest and IV fluids her symptoms improve. -Surgery evaluated patient and proceeded with robotic Xi taken down of the stomach and sac from the mediastinum with endoscopic PEG placed by Dr. Bobbye Morton on 03/15/2021. -Diet advanced to Soft.  -Further management per surgery -Abdominal binder in place.  Continue with  ensure.  Not eating, does not have appetite. No BM. KUB negative for obstruction. Had small BM. Encourage oral intake.  Plan to transfer to SNF Discussed with Sx, recommend oral nutrition, if patient doesn't progress could use peg tube for nutrition as last resource.   2-Paroxysmal A. fib: Coreg and amiodarone Transition to eliquis 12/17   3-Leukocytosis: PNA Chest x ray ; Moderate Right basilar opacity.  Started  IV antibiotics 12/16: vancomycin discontinue,. Plan for 5 days antibiotics. Completed antibiotics.  Incentive spirometry.  Normalized.    -HTN: Continue with Coreg.   -Sinus syndrome status post pacemaker   -Chronic Diastolic heart failure Monitor volume status.    -Hypokalemia: Replaced.    Mild transaminases; monitor. Trending down.    Nutrition Problem: Inadequate oral intake Etiology: inability to eat      Discharge Diagnoses:  Principal Problem:   Gastric volvulus Active Problems:   Large hiatal hernia   A-fib (HCC)   Sick sinus syndrome (HCC)   CHF (congestive heart failure) (HCC)   Cardiomyopathy, idiopathic (HCC)   Essential hypertension   Memory changes    Discharge Instructions  Discharge Instructions     Diet - low sodium heart healthy   Complete by: As directed    Discharge wound care:   Complete by: As directed    See sx recommendation   Increase activity slowly   Complete by: As directed       Allergies as of 03/21/2021   No Known Allergies      Medication List     STOP taking these medications    hydrochlorothiazide 12.5 MG capsule Commonly known as: MICROZIDE   losartan 50 MG tablet Commonly known as: COZAAR   omeprazole 40 MG  capsule Commonly known as: PRILOSEC       TAKE these medications    acetaminophen 500 MG tablet Commonly known as: TYLENOL Take 1,000 mg by mouth every 6 (six) hours as needed for moderate pain or headache.   amiodarone 200 MG tablet Commonly known as: PACERONE Take 200 mg by  mouth daily.   apixaban 5 MG Tabs tablet Commonly known as: ELIQUIS Take 5 mg by mouth 2 (two) times daily.   bisacodyl 10 MG suppository Commonly known as: DULCOLAX Place 1 suppository (10 mg total) rectally daily as needed for moderate constipation.   carvedilol 3.125 MG tablet Commonly known as: COREG Take 3.125 mg by mouth 2 (two) times daily with a meal.   docusate sodium 100 MG capsule Commonly known as: COLACE Take 1 capsule (100 mg total) by mouth 2 (two) times daily.   feeding supplement Liqd Take 237 mLs by mouth 4 (four) times daily.   free water Soln Place 30 mLs into feeding tube daily. Start taking on: March 22, 2021   lactulose 10 GM/15ML solution Commonly known as: CHRONULAC Take 30 mLs (20 g total) by mouth daily. Start taking on: March 22, 2021   levothyroxine 75 MCG tablet Commonly known as: SYNTHROID Take 75 mcg by mouth daily before breakfast.   lidocaine 5 % Commonly known as: LIDODERM Place 1 patch onto the skin daily. Remove & Discard patch within 12 hours or as directed by MD Start taking on: March 22, 2021   Melatonin 10 MG Tabs Take 10 mg by mouth at bedtime as needed (sleep).   pantoprazole 40 MG tablet Commonly known as: PROTONIX Take 1 tablet (40 mg total) by mouth daily. Start taking on: March 22, 2021   polyethylene glycol 17 g packet Commonly known as: MIRALAX / GLYCOLAX Take 17 g by mouth daily. Start taking on: March 22, 2021   rosuvastatin 20 MG tablet Commonly known as: CRESTOR Take 20 mg by mouth daily.   senna 8.6 MG Tabs tablet Commonly known as: SENOKOT Take 1 tablet (8.6 mg total) by mouth daily. Start taking on: March 22, 2021   Systane 0.4-0.3 % Gel ophthalmic gel Generic drug: Polyethyl Glycol-Propyl Glycol Place 1 application into both eyes 3 (three) times daily as needed. What changed: reasons to take this   traMADol 50 MG tablet Commonly known as: ULTRAM Take 1 tablet (50 mg total) by  mouth every 6 (six) hours as needed for up to 5 days for moderate pain or severe pain.   traZODone 50 MG tablet Commonly known as: DESYREL Take 25-50 mg by mouth at bedtime as needed for sleep.               Discharge Care Instructions  (From admission, onward)           Start     Ordered   03/21/21 0000  Discharge wound care:       Comments: See sx recommendation   03/21/21 1033            Follow-up Information     [facility]. Go on 04/12/2021.   Specialty: General Surgery Why: 1:45 PM with Dr. Hassell Done. Please arrive 30 min prior to appointment time. Bring photo ID and insurance information with you. Contact information: [place] [address] [phone]                No Known Allergies  Consultations: Surgery    Procedures/Studies: DG Chest 2 View  Result Date: 03/16/2021 CLINICAL DATA:  Leukocytosis. EXAM: CHEST - 2 VIEW COMPARISON:  March 09, 2021. FINDINGS: Stable cardiomegaly. Left-sided pacemaker is unchanged in position. No pneumothorax is noted. Large hiatal hernia is again noted. Moderate right basilar opacity is noted concerning for atelectasis with associated pleural effusion. Mild left basilar subsegmental atelectasis is noted. Bony thorax is unremarkable. IMPRESSION: Large hiatal hernia is noted. Moderate right basilar opacity is now noted concerning for atelectasis with associated pleural effusion. Electronically Signed   By: Marijo Conception M.D.   On: 03/16/2021 10:22   DG Abd 1 View  Result Date: 03/20/2021 CLINICAL DATA:  Persistent abdominal pain and swelling for several weeks EXAM: ABDOMEN - 1 VIEW COMPARISON:  02/14/2021 FINDINGS: Gastrostomy tube projects over stomach. Mild gas within stomach. Nonobstructive bowel gas pattern with gas and stool throughout colon to rectum. Nondistended small bowel. No bowel dilatation or bowel wall thickening. Bones demineralized with degenerative disc and  facet disease changes of thoracolumbar spine. No urine tract calcification. Surgical clips RIGHT upper quadrant question cholecystectomy. IMPRESSION: No acute abnormalities. Electronically Signed   By: Lavonia Dana M.D.   On: 03/20/2021 12:22   CT CHEST ABDOMEN PELVIS W CONTRAST  Result Date: 03/10/2021 CLINICAL DATA:  Chest pain.  History of hernia. EXAM: CT CHEST, ABDOMEN, AND PELVIS WITH CONTRAST TECHNIQUE: Multidetector CT imaging of the chest, abdomen and pelvis was performed following the standard protocol during bolus administration of intravenous contrast. CONTRAST:  140mL OMNIPAQUE IOHEXOL 300 MG/ML  SOLN COMPARISON:  02/11/2021. FINDINGS: CT CHEST FINDINGS Cardiovascular: The heart is normal in size and there is no pericardial effusion. Scattered coronary artery calcifications are noted. There is a left-sided pacemaker with leads terminating in the heart. There is mild atherosclerotic calcification of the aorta without evidence of aneurysm. Pulmonary trunk is normal in caliber. Mediastinum/Nodes: No enlarged mediastinal, hilar, or axillary lymph nodes. Thyroid gland, trachea, and esophagus demonstrate no significant findings. There is a large hiatal hernia, unchanged from the prior exam. Lungs/Pleura: Apical pleural scarring and mild subpleural fibrosis is noted bilaterally. Mild atelectasis is present in the lower lobes bilaterally adjacent to the hernia pouch. No effusion or pneumothorax. Musculoskeletal: An old rib fracture is noted at T10 on the right. Degenerative changes are present in the thoracic spine. A compression deformity is present in the inferior endplate of 624THL which is unchanged from the prior exam. CT ABDOMEN PELVIS FINDINGS Hepatobiliary: No focal liver abnormality is seen. There is mild biliary ductal dilatation which is likely related to patient's post cholecystectomy status. Pancreas: Unremarkable. No pancreatic ductal dilatation or surrounding inflammatory changes. Spleen: Normal  in size without focal abnormality. Adrenals/Urinary Tract: Adrenal glands are unremarkable. Kidneys are normal, without renal calculi, focal lesion, or hydronephrosis. Bladder is unremarkable. Stomach/Bowel: There is a large hiatal hernia with the gastric body and antrum intrathoracic in location. Air-fluid levels are noted in the stomach. The gastric fundus and proximal body are intra-abdominal in location. Scattered diverticula are present along the colon without evidence of diverticulitis. The appendix is not visualized on exam. No free air or pneumatosis is seen. Vascular/Lymphatic: Aortic atherosclerosis. No enlarged abdominal or pelvic lymph nodes. Reproductive: Uterus and bilateral adnexa are unremarkable. Other: No free fluid. A small fat containing inguinal hernia is noted on the left. Musculoskeletal: Degenerative changes in the lumbar spine. No acute osseous abnormality is seen. IMPRESSION: 1. Large hiatal hernia with the gastric body and antrum intrathoracic in location and air-fluid levels in the stomach, unchanged from the previous exam.  Findings are concerning for gastric volvulus or early obstruction. Surgical consultation is recommended. 2. Coronary artery calcifications. 3. Stable compression deformity in the inferior endplate of 624THL. Electronically Signed   By: Brett Fairy M.D.   On: 03/10/2021 01:02   DG Chest Port 1 View  Result Date: 03/09/2021 CLINICAL DATA:  Chest pain EXAM: PORTABLE CHEST 1 VIEW COMPARISON:  02/16/2021 FINDINGS: Cardiac shadow is stable. Pacing device is again noted. Large hiatal hernia is again seen. Lungs are clear bilaterally. No bony abnormality is noted. IMPRESSION: Stable large hiatal hernia.  No new focal abnormality is noted. Electronically Signed   By: Inez Catalina M.D.   On: 03/09/2021 23:17   ECHOCARDIOGRAM COMPLETE  Result Date: 03/10/2021    ECHOCARDIOGRAM REPORT   Patient Name:   BROOKIE STANFILL Date of Exam: 03/10/2021 Medical Rec #:  [id number]   Height:        62.0 in Accession #:    [id number]  Weight:       158.3 lb Date of Birth:  [birth year]   BSA:          1.731 m Patient Age:    71 years    BP:           118/58 mmHg Patient Gender: F           HR:           60 bpm. Exam Location:  Inpatient Procedure: Cardiac Doppler, Color Doppler, 3D Echo and 2D Echo STAT ECHO Indications:    CHF  History:        Patient has no prior history of Echocardiogram examinations.                 CHF; Arrythmias:Atrial Fibrillation.  Sonographer:    Glo Herring Referring Phys: [place]  1. Left ventricular ejection fraction, by estimation, is 60 to 65%. The left ventricle has normal function. The left ventricle has no regional wall motion abnormalities. Left ventricular diastolic parameters are consistent with Grade II diastolic dysfunction (pseudonormalization).  2. Right ventricular systolic function is normal. The right ventricular size is normal.  3. Left atrial size was moderate to severe.  4. Right atrial size was moderately dilated.  5. The mitral valve is grossly normal. Mild mitral valve regurgitation.  6. Focal calcification. P1/2 T 501 ms. The aortic valve is calcified. Aortic valve regurgitation is mild to moderate.  7. The inferior vena cava is normal in size with greater than 50% respiratory variability, suggesting right atrial pressure of 3 mmHg. Comparison(s): No prior Echocardiogram. FINDINGS  Left Ventricle: Left ventricular ejection fraction, by estimation, is 60 to 65%. The left ventricle has normal function. The left ventricle has no regional wall motion abnormalities. The left ventricular internal cavity size was normal in size. There is  no left ventricular hypertrophy. Left ventricular diastolic parameters are consistent with Grade II diastolic dysfunction (pseudonormalization). Right Ventricle: The right ventricular size is normal. No increase in right ventricular wall thickness. Right ventricular systolic function is normal. Left  Atrium: Left atrial size was moderate to severe. Right Atrium: Right atrial size was moderately dilated. Pericardium: There is no evidence of pericardial effusion. Mitral Valve: The mitral valve is grossly normal. Mild mitral annular calcification. Mild mitral valve regurgitation. MV peak gradient, 7.2 mmHg. The mean mitral valve gradient is 2.5 mmHg. Tricuspid Valve: The tricuspid valve is grossly normal. Tricuspid valve regurgitation is mild. Aortic Valve: Focal calcification. P1/2 T 501 ms. The aortic valve  is calcified. Aortic valve regurgitation is mild to moderate. Aortic regurgitation PHT measures 501 msec. Aortic valve mean gradient measures 4.0 mmHg. Aortic valve peak gradient measures  6.7 mmHg. Aortic valve area, by VTI measures 1.89 cm. Pulmonic Valve: The pulmonic valve was grossly normal. Pulmonic valve regurgitation is mild. Aorta: The aortic root and ascending aorta are structurally normal, with no evidence of dilitation. Venous: The inferior vena cava is normal in size with greater than 50% respiratory variability, suggesting right atrial pressure of 3 mmHg. IAS/Shunts: No atrial level shunt detected by color flow Doppler. Additional Comments: A device lead is visualized.  LEFT VENTRICLE PLAX 2D LVIDd:         5.00 cm     Diastology LVIDs:         3.20 cm     LV e' medial:    6.62 cm/s LV PW:         0.80 cm     LV E/e' medial:  16.5 LV IVS:        0.90 cm     LV e' lateral:   7.37 cm/s LVOT diam:     2.00 cm     LV E/e' lateral: 14.8 LV SV:         63 LV SV Index:   36 LVOT Area:     3.14 cm  LV Volumes (MOD) LV vol d, MOD A2C: 77.8 ml LV vol d, MOD A4C: 95.6 ml LV vol s, MOD A2C: 32.5 ml LV vol s, MOD A4C: 35.4 ml LV SV MOD A2C:     45.3 ml LV SV MOD A4C:     95.6 ml LV SV MOD BP:      52.9 ml RIGHT VENTRICLE             IVC RV Basal diam:  3.70 cm     IVC diam: 1.70 cm RV S prime:     12.80 cm/s LEFT ATRIUM             Index        RIGHT ATRIUM           Index LA diam:        3.90 cm 2.25 cm/m    RA Area:     18.40 cm LA Vol (A2C):   89.2 ml 51.54 ml/m  RA Volume:   45.40 ml  26.23 ml/m LA Vol (A4C):   67.8 ml 39.17 ml/m LA Biplane Vol: 80.4 ml 46.45 ml/m  AORTIC VALVE                    PULMONIC VALVE AV Area (Vmax):    1.88 cm     PV Vmax:       0.74 m/s AV Area (Vmean):   1.74 cm     PV Peak grad:  2.2 mmHg AV Area (VTI):     1.89 cm AV Vmax:           129.00 cm/s AV Vmean:          90.600 cm/s AV VTI:            0.331 m AV Peak Grad:      6.7 mmHg AV Mean Grad:      4.0 mmHg LVOT Vmax:         77.00 cm/s LVOT Vmean:        50.200 cm/s LVOT VTI:          0.199 m LVOT/AV VTI  ratio: 0.60 AI PHT:            501 msec  AORTA Ao Root diam: 3.20 cm Ao Asc diam:  3.60 cm MITRAL VALVE                TRICUSPID VALVE MV Area (PHT): 3.36 cm     TR Peak grad:   26.2 mmHg MV Area VTI:   1.52 cm     TR Vmax:        256.00 cm/s MV Peak grad:  7.2 mmHg MV Mean grad:  2.5 mmHg     SHUNTS MV Vmax:       1.34 m/s     Systemic VTI:  0.20 m MV Vmean:      73.4 cm/s    Systemic Diam: 2.00 cm MV Decel Time: 226 msec MR PISA:        2.26 cm MR PISA Radius: 0.60 cm MV E velocity: 109.00 cm/s MV A velocity: 60.40 cm/s MV E/A ratio:  1.80 Landscape architect signed by Phineas Inches Signature Date/Time: 03/10/2021/12:53:06 PM    Final    [place] EKG SITE RITE  Result Date: 03/13/2021 If Site Rite image not attached, placement could not be confirmed due to current cardiac rhythm.    Subjective: She is alert, does not have appetite. Had BM   Discharge Exam: Vitals:   03/20/21 2157 03/21/21 0348  BP: (!) 133/106 (!) 114/43  Pulse: (!) 58 (!) 59  Resp: 17 17  Temp: 98.7 F (37.1 C) 97.8 F (36.6 C)  SpO2: 99% 98%     General: Pt is alert, awake, not in acute distress Cardiovascular: RRR, S1/S2 +, no rubs, no gallops Respiratory: CTA bilaterally, no wheezing, no rhonchi Abdominal: Soft, NT, ND, bowel sounds + G tube in place Extremities: no edema, no cyanosis    The results of significant  diagnostics from this hospitalization (including imaging, microbiology, ancillary and laboratory) are listed below for reference.     Microbiology: Recent Results (from the past 240 hour(s))  MRSA Next Gen by PCR, Nasal     Status: None   Collection Time: 03/16/21 10:52 AM   Specimen: Nasal Mucosa; Nasal Swab  Result Value Ref Range Status   MRSA by PCR Next Gen NOT DETECTED NOT DETECTED Final    Comment: (NOTE) The GeneXpert MRSA Assay (FDA approved for NASAL specimens only), is one component of a comprehensive MRSA colonization surveillance program. It is not intended to diagnose MRSA infection nor to guide or monitor treatment for MRSA infections. Test performance is not FDA approved in patients less than 44 years old. Performed at [facility], [place] [address] [place], [place] [address]   Urine Culture     Status: None   Collection Time: 03/19/21  2:00 AM   Specimen: Urine, Clean Catch  Result Value Ref Range Status   Specimen Description URINE, CLEAN CATCH  Final   Special Requests NONE  Final   Culture   Final    NO GROWTH Performed at [facility], [place] [address], [place],  [address]    Report Status 03/20/2021 FINAL  Final  SARS CORONAVIRUS 2 (TAT 6-24 HRS) Nasopharyngeal Nasopharyngeal Swab     Status: None   Collection Time: 03/19/21  5:32 PM   Specimen: Nasopharyngeal Swab  Result Value Ref Range Status   SARS Coronavirus 2 NEGATIVE NEGATIVE Final    Comment: (NOTE) SARS-CoV-2 target nucleic acids are NOT DETECTED.  The SARS-CoV-2  RNA is generally detectable in upper and lower respiratory specimens during the acute phase of infection. Negative results do not preclude SARS-CoV-2 infection, do not rule out co-infections with other pathogens, and should not be used as the sole basis for treatment or other patient management decisions. Negative results must be combined with clinical observations, patient history, and epidemiological information.  The expected result is Negative.  Fact Sheet for Patients: SugarRoll.be  Fact Sheet for Healthcare Providers: https://www.woods-mathews.com/  This test is not yet approved or cleared by the [place] FDA and  has been authorized for detection and/or diagnosis of SARS-CoV-2 by FDA under an Emergency Use Authorization (EUA). This EUA will remain  in effect (meaning this test can be used) for the duration of the COVID-19 declaration under Se ction 564(b)(1) of the Act, 21 U.S.C. section 360bbb-3(b)(1), unless the authorization is terminated or revoked sooner.  Performed at [facility], [place] [address] [place], [place] [address]      Labs: BNP (last 3 results) Recent Labs    03/12/21 0053 03/13/21 0053 03/14/21 0128  BNP 383.8* 503.4* [id number]*   Basic Metabolic Panel: Recent Labs  Lab 03/15/21 0422 03/16/21 0630 03/17/21 0202 03/18/21 0144 03/19/21 0106 03/20/21 0046 03/21/21 0259  NA 135   < > 134* 132* 134* 135 136  K 4.1   < > 3.7 3.4* 3.8 4.3 4.0  CL 104   < > 105 104 106 104 103  CO2 22   < > 24 22 24 26 30   GLUCOSE 161*   < > 105* 141* 116* 103* 101*  BUN 10   < > 16 18 12 15 12   CREATININE 0.85   < > 0.77 0.66 0.57 0.61 0.62  CALCIUM 8.6*   < > 8.2* 7.9* 8.2* 8.2* 8.5*  MG 1.9  --   --   --   --   --   --   PHOS 4.5  --   --   --   --   --   --    < > = values in this interval not displayed.   Liver Function Tests: Recent Labs  Lab 03/17/21 0202 03/18/21 0144 03/19/21 0106 03/20/21 0046 03/21/21 0259  AST 66* 38 30 41 34  ALT 58* 45* 38 36 32  ALKPHOS 105 117 131* 138* 129*  BILITOT 1.1 1.0 0.9 0.9 0.8  PROT 5.0* 5.0* 5.2* 5.3* 5.3*  ALBUMIN 2.5* 2.3* 2.4* 2.3* 2.4*   No results for input(s): LIPASE, AMYLASE in the last 168 hours. No results for input(s): AMMONIA in the last 168 hours. CBC: Recent Labs  Lab 03/15/21 0422 03/16/21 0630 03/17/21 0202 03/18/21 0144 03/19/21 0106  03/20/21 0046 03/21/21 0259  WBC 10.9*   < > 9.4 7.5 7.7 7.6 8.0  NEUTROABS 10.0*  --   --   --   --   --   --   HGB 11.1*   < > 8.8* 8.6* 8.8* 9.1* 9.0*  HCT 33.9*   < > 27.9* 27.3* 27.6* 28.6* 27.4*  MCV 95.8   < > 97.6 97.5 96.5 97.3 96.8  PLT 168   < > 134* 130* 147* 166 171   < > = values in this interval not displayed.   Cardiac Enzymes: No results for input(s): CKTOTAL, CKMB, CKMBINDEX, TROPONINI in the last 168 hours. BNP: Invalid input(s): POCBNP CBG: No results for input(s): GLUCAP in the last 168 hours. D-Dimer No results for input(s): DDIMER in the last  72 hours. Hgb A1c No results for input(s): HGBA1C in the last 72 hours. Lipid Profile No results for input(s): CHOL, HDL, LDLCALC, TRIG, CHOLHDL, LDLDIRECT in the last 72 hours. Thyroid function studies No results for input(s): TSH, T4TOTAL, T3FREE, THYROIDAB in the last 72 hours.  Invalid input(s): FREET3 Anemia work up No results for input(s): VITAMINB12, FOLATE, FERRITIN, TIBC, IRON, RETICCTPCT in the last 72 hours. Urinalysis    Component Value Date/Time   COLORURINE YELLOW 03/16/2021 0812   APPEARANCEUR CLEAR 03/16/2021 0812   LABSPEC >1.030 (H) 03/16/2021 0812   PHURINE 5.5 03/16/2021 0812   GLUCOSEU NEGATIVE 03/16/2021 0812   HGBUR TRACE (A) 03/16/2021 0812   BILIRUBINUR SMALL (A) 03/16/2021 0812   KETONESUR NEGATIVE 03/16/2021 0812   PROTEINUR 30 (A) 03/16/2021 0812   NITRITE NEGATIVE 03/16/2021 0812   LEUKOCYTESUR NEGATIVE 03/16/2021 0812   Sepsis Labs Invalid input(s): PROCALCITONIN,  WBC,  LACTICIDVEN Microbiology Recent Results (from the past 240 hour(s))  MRSA Next Gen by PCR, Nasal     Status: None   Collection Time: 03/16/21 10:52 AM   Specimen: Nasal Mucosa; Nasal Swab  Result Value Ref Range Status   MRSA by PCR Next Gen NOT DETECTED NOT DETECTED Final    Comment: (NOTE) The GeneXpert MRSA Assay (FDA approved for NASAL specimens only), is one component of a comprehensive MRSA  colonization surveillance program. It is not intended to diagnose MRSA infection nor to guide or monitor treatment for MRSA infections. Test performance is not FDA approved in patients less than 56 years old. Performed at [facility], [place] [address] [place], [place] [address]   Urine Culture     Status: None   Collection Time: 03/19/21  2:00 AM   Specimen: Urine, Clean Catch  Result Value Ref Range Status   Specimen Description URINE, CLEAN CATCH  Final   Special Requests NONE  Final   Culture   Final    NO GROWTH Performed at [facility], [place] [address], [place], [place] [address]    Report Status 03/20/2021 FINAL  Final  SARS CORONAVIRUS 2 (TAT 6-24 HRS) Nasopharyngeal Nasopharyngeal Swab     Status: None   Collection Time: 03/19/21  5:32 PM   Specimen: Nasopharyngeal Swab  Result Value Ref Range Status   SARS Coronavirus 2 NEGATIVE NEGATIVE Final    Comment: (NOTE) SARS-CoV-2 target nucleic acids are NOT DETECTED.  The SARS-CoV-2 RNA is generally detectable in upper and lower respiratory specimens during the acute phase of infection. Negative results do not preclude SARS-CoV-2 infection, do not rule out co-infections with other pathogens, and should not be used as the sole basis for treatment or other patient management decisions. Negative results must be combined with clinical observations, patient history, and epidemiological information. The expected result is Negative.  Fact Sheet for Patients: SugarRoll.be  Fact Sheet for Healthcare Providers: https://www.woods-mathews.com/  This test is not yet approved or cleared by the [place] FDA and  has been authorized for detection and/or diagnosis of SARS-CoV-2 by FDA under an Emergency Use Authorization (EUA). This EUA will remain  in effect (meaning this test can be used) for the duration of the COVID-19 declaration under Se ction 564(b)(1) of the Act, 21  U.S.C. section 360bbb-3(b)(1), unless the authorization is terminated or revoked sooner.  Performed at [facility], [place] [address], [place], [place] [address]      Time coordinating discharge: 40 minutes  SIGNED:   Elmarie Shiley, MD  [facility]

## 2021-03-21 NOTE — Progress Notes (Signed)
First attempt to call report unanswered

## 2021-03-21 NOTE — Progress Notes (Signed)
Progress Note  7 Days Post-Op  Subjective: No specific complaints, not eating much, feels full  Objective: Vital signs in last 24 hours: Temp:  [97.8 F (36.6 C)-98.7 F (37.1 C)] 97.8 F (36.6 C) (12/20 0348) Pulse Rate:  [58-59] 59 (12/20 0348) Resp:  [16-17] 17 (12/20 0348) BP: (114-133)/(43-106) 114/43 (12/20 0348) SpO2:  [98 %-100 %] 98 % (12/20 0348) Last BM Date: 03/14/21  Intake/Output from previous day: 12/19 0701 - 12/20 0700 In: 240 [P.O.:240] Out: 750 [Urine:750] Intake/Output this shift: No intake/output data recorded.  PE: General: pleasant, WD, elderly female who is laying in bed in NAD Heart: regular, rate, and rhythm.   Lungs:  Respiratory effort nonlabored, lungs CTAB Abd: soft, appropriately ttp, ND, +BS, incisions c/d/I, PEG in place stable position at the skin, and clamped, binder present MS: all 4 extremities are symmetrical with no cyanosis, clubbing, or edema. Skin: warm and dry with no masses, lesions, or rashes Psych: A&Ox4 with some mild confusion regarding date of surgery    Lab Results:  Recent Labs    03/20/21 0046 03/21/21 0259  WBC 7.6 8.0  HGB 9.1* 9.0*  HCT 28.6* 27.4*  PLT 166 171    BMET Recent Labs    03/20/21 0046 03/21/21 0259  NA 135 136  K 4.3 4.0  CL 104 103  CO2 26 30  GLUCOSE 103* 101*  BUN 15 12  CREATININE 0.61 0.62  CALCIUM 8.2* 8.5*    PT/INR No results for input(s): LABPROT, INR in the last 72 hours. CMP     Component Value Date/Time   NA 136 03/21/2021 0259   K 4.0 03/21/2021 0259   CL 103 03/21/2021 0259   CO2 30 03/21/2021 0259   GLUCOSE 101 (H) 03/21/2021 0259   BUN 12 03/21/2021 0259   CREATININE 0.62 03/21/2021 0259   CALCIUM 8.5 (L) 03/21/2021 0259   PROT 5.3 (L) 03/21/2021 0259   ALBUMIN 2.4 (L) 03/21/2021 0259   AST 34 03/21/2021 0259   ALT 32 03/21/2021 0259   ALKPHOS 129 (H) 03/21/2021 0259   BILITOT 0.8 03/21/2021 0259   GFRNONAA >60 03/21/2021 0259   GFRAA >60 12/05/2019  2254   Lipase     Component Value Date/Time   LIPASE 42 02/11/2021 2017       Studies/Results: DG Abd 1 View  Result Date: 03/20/2021 CLINICAL DATA:  Persistent abdominal pain and swelling for several weeks EXAM: ABDOMEN - 1 VIEW COMPARISON:  02/14/2021 FINDINGS: Gastrostomy tube projects over stomach. Mild gas within stomach. Nonobstructive bowel gas pattern with gas and stool throughout colon to rectum. Nondistended small bowel. No bowel dilatation or bowel wall thickening. Bones demineralized with degenerative disc and facet disease changes of thoracolumbar spine. No urine tract calcification. Surgical clips RIGHT upper quadrant question cholecystectomy. IMPRESSION: No acute abnormalities. Electronically Signed   By: Ulyses Southward M.D.   On: 03/20/2021 12:22    Anti-infectives: Anti-infectives (From admission, onward)    Start     Dose/Rate Route Frequency Ordered Stop   03/17/21 1930  vancomycin (VANCOCIN) IVPB 1000 mg/200 mL premix  Status:  Discontinued        1,000 mg 200 mL/hr over 60 Minutes Intravenous Every 24 hours 03/16/21 1054 03/18/21 1007   03/16/21 1200  vancomycin (VANCOREADY) IVPB 1500 mg/300 mL        1,500 mg 150 mL/hr over 120 Minutes Intravenous  Once 03/16/21 1054 03/16/21 2132   03/16/21 1100  ceFEPIme (MAXIPIME) 2 g in sodium  chloride 0.9 % 100 mL IVPB        2 g 200 mL/hr over 30 Minutes Intravenous Every 12 hours 03/16/21 1054 03/21/21 1959   03/14/21 0600  ceFAZolin (ANCEF) IVPB 2g/100 mL premix        2 g 200 mL/hr over 30 Minutes Intravenous On call to O.R. 03/13/21 1210 03/14/21 1023        Assessment/Plan Hiatal hernia  POD7 S/P robotic hiatal hernia repair with PEG placement 12/13 Dr. Daphine Deutscher - continue G-tube clamped, flush daily for tube maintenance. OK to vent if needed for nausea - abdominal binder loosely to protect PEG - tolerating soft diet, will add protein shakes as she seems to be doing better with liquid than solids. Can try adding  reglan; not unexpected to have an element of poor gastric emptying in this setting. - continue scheduled tylenol and lidocaine patch for pain control, tramadol q6h prn  - add suppository today for bowel regimen  - continue to mobilize, PT/OT recommending SNF  - stable for discharge from a surgical standpoint when medically cleared. Follow up in [facility] already     FEN: soft diet VTE: eliquis ID: ancef pre-op; cefepime 12/15>>, vanc 12/15>12/17 Foley: removed POD2   Sick sinus syndrome s/p pacemaker Paroxysmal A. Fib on eliquis - eliquis on hold Chronic diastolic CHF - EF is 60-65% on echo 12/9 HTN HLD GERD  LOS: 11 days    Berna Bue, MD [facility] 03/21/2021, 9:21 AM Please see Amion for pager number during day hours 7:00am-4:30pm

## 2021-03-21 NOTE — Progress Notes (Signed)
Pt discharged to [facility]. All attempts to call report unanswered

## 2021-03-21 NOTE — Progress Notes (Signed)
[facility] nurse [phone] finally called back and was given pt report, all questions addressed

## 2021-03-21 NOTE — TOC Transition Note (Addendum)
Transition of Care [facility]) - CM/SW Discharge Note   Patient Details  Name: Danielle Blanchard MRN: [id number] Date of Birth: 01-16-1941  Transition of Care [facility]) CM/SW Contact:  Jimmy Picket, LCSW Phone Number: 03/21/2021, 11:19 AM   Clinical Narrative:     Per MD patient ready for DC to [facility]. RN, patient, patient's family, and facility notified of DC. Discharge Summary and FL2 sent to facility. DC packet on chart. Insurance Berkley Harvey has been received and pt is covid negative. Ambulance transport requested for patient.    RN to call report to [phone]. Room 702.  CSW will sign off for now as social work intervention is no longer needed. Please consult [place] again if new needs arise.   Final next level of care: Skilled Nursing Facility Barriers to Discharge: Barriers Resolved   Patient Goals and CMS Choice Patient states their goals for this hospitalization and ongoing recovery are:: to get better CMS Medicare.gov Compare Post Acute Care list provided to:: Patient Choice offered to / list presented to : Patient  Discharge Placement              Patient chooses bed at: [facility] Patient to be transferred to facility by: [facility] Name of family member notified: pt O&A x4 Patient and family notified of of transfer: 03/21/21  Discharge Plan and Services                                     Social Determinants of Health (SDOH) Interventions     Readmission Risk Interventions No flowsheet data found.  Jimmy Picket, LCSW Clinical Social Worker

## 2021-05-20 ENCOUNTER — Encounter (HOSPITAL_COMMUNITY): Payer: Self-pay

## 2021-05-20 ENCOUNTER — Emergency Department (HOSPITAL_COMMUNITY)
Admission: EM | Admit: 2021-05-20 | Discharge: 2021-05-20 | Disposition: A | Discharge status: Home / Self Care | Payer: Medicare Other | Attending: Emergency Medicine | Admitting: Emergency Medicine

## 2021-05-20 ENCOUNTER — Emergency Department (HOSPITAL_COMMUNITY): Payer: Medicare Other

## 2021-05-20 DIAGNOSIS — Z20822 Contact with and (suspected) exposure to covid-19: Secondary | ICD-10-CM | POA: Insufficient documentation

## 2021-05-20 DIAGNOSIS — I700 Atherosclerosis of aorta: Secondary | ICD-10-CM | POA: Insufficient documentation

## 2021-05-20 DIAGNOSIS — K440 Diaphragmatic hernia with obstruction, without gangrene: Secondary | ICD-10-CM | POA: Insufficient documentation

## 2021-05-20 DIAGNOSIS — R531 Weakness: Secondary | ICD-10-CM | POA: Diagnosis not present

## 2021-05-20 DIAGNOSIS — R7989 Other specified abnormal findings of blood chemistry: Secondary | ICD-10-CM | POA: Insufficient documentation

## 2021-05-20 DIAGNOSIS — R627 Adult failure to thrive: Secondary | ICD-10-CM | POA: Diagnosis present

## 2021-05-20 DIAGNOSIS — M6281 Muscle weakness (generalized): Secondary | ICD-10-CM | POA: Diagnosis present

## 2021-05-20 DIAGNOSIS — Z7901 Long term (current) use of anticoagulants: Secondary | ICD-10-CM | POA: Diagnosis not present

## 2021-05-20 LAB — CBC
HCT: 34.6 % — ABNORMAL LOW (ref 36.0–46.0)
Hemoglobin: 11.1 g/dL — ABNORMAL LOW (ref 12.0–15.0)
MCH: 29.8 pg (ref 26.0–34.0)
MCHC: 32.1 g/dL (ref 30.0–36.0)
MCV: 92.8 fL (ref 80.0–100.0)
Platelets: 194 10*3/uL (ref 150–400)
RBC: 3.73 MIL/uL — ABNORMAL LOW (ref 3.87–5.11)
RDW: 13.7 % (ref 11.5–15.5)
WBC: 7.3 10*3/uL (ref 4.0–10.5)
nRBC: 0 % (ref 0.0–0.2)

## 2021-05-20 LAB — BASIC METABOLIC PANEL
Anion gap: 13 (ref 5–15)
BUN: 34 mg/dL — ABNORMAL HIGH (ref 8–23)
CO2: 24 mmol/L (ref 22–32)
Calcium: 9.2 mg/dL (ref 8.9–10.3)
Chloride: 99 mmol/L (ref 98–111)
Creatinine, Ser: 1.11 mg/dL — ABNORMAL HIGH (ref 0.44–1.00)
GFR, Estimated: 50 mL/min — ABNORMAL LOW (ref >60)
Glucose, Bld: 121 mg/dL — ABNORMAL HIGH (ref 70–99)
Potassium: 3.4 mmol/L — ABNORMAL LOW (ref 3.5–5.1)
Sodium: 136 mmol/L (ref 135–145)

## 2021-05-20 LAB — DIFFERENTIAL
Abs Immature Granulocytes: 0.02 10*3/uL (ref 0.00–0.07)
Basophils Absolute: 0.1 10*3/uL (ref 0.0–0.1)
Basophils Relative: 1 %
Eosinophils Absolute: 0 10*3/uL (ref 0.0–0.5)
Eosinophils Relative: 1 %
Immature Granulocytes: 0 %
Lymphocytes Relative: 16 %
Lymphs Abs: 1.1 10*3/uL (ref 0.7–4.0)
Monocytes Absolute: 0.6 10*3/uL (ref 0.1–1.0)
Monocytes Relative: 8 %
Neutro Abs: 5.4 10*3/uL (ref 1.7–7.7)
Neutrophils Relative %: 74 %

## 2021-05-20 LAB — URINALYSIS, ROUTINE W REFLEX MICROSCOPIC
Bacteria, UA: NONE SEEN
Bilirubin Urine: NEGATIVE
Glucose, UA: NEGATIVE mg/dL
Hgb urine dipstick: NEGATIVE
Ketones, ur: NEGATIVE mg/dL
Nitrite: NEGATIVE
Protein, ur: NEGATIVE mg/dL
Specific Gravity, Urine: 1.013 (ref 1.005–1.030)
pH: 5 (ref 5.0–8.0)

## 2021-05-20 LAB — HEPATIC FUNCTION PANEL
ALT: 32 U/L (ref 0–44)
AST: 65 U/L — ABNORMAL HIGH (ref 15–41)
Albumin: 3.9 g/dL (ref 3.5–5.0)
Alkaline Phosphatase: 83 U/L (ref 38–126)
Bilirubin, Direct: 0.6 mg/dL — ABNORMAL HIGH (ref 0.0–0.2)
Indirect Bilirubin: 1.3 mg/dL — ABNORMAL HIGH (ref 0.3–0.9)
Total Bilirubin: 1.9 mg/dL — ABNORMAL HIGH (ref 0.3–1.2)
Total Protein: 7 g/dL (ref 6.5–8.1)

## 2021-05-20 LAB — TROPONIN I (HIGH SENSITIVITY): Troponin I (High Sensitivity): 10 ng/L (ref <18)

## 2021-05-20 LAB — CBG MONITORING, ED: Glucose-Capillary: 118 mg/dL — ABNORMAL HIGH (ref 70–99)

## 2021-05-20 LAB — RESP PANEL BY RT-PCR (FLU A&B, COVID) ARPGX2
Influenza A by PCR: NEGATIVE
Influenza B by PCR: NEGATIVE
SARS Coronavirus 2 by RT PCR: NEGATIVE

## 2021-05-20 MED ORDER — SODIUM CHLORIDE 0.9 % IV BOLUS
1000.0000 mL | Freq: Once | INTRAVENOUS | Status: AC
  Administered 2021-05-20: 1000 mL via INTRAVENOUS

## 2021-05-20 MED ORDER — SODIUM CHLORIDE 0.9 % IV BOLUS: 1000.0000 mL | Freq: Once | INTRAVENOUS | Status: DC

## 2021-05-20 MED ORDER — SODIUM CHLORIDE 0.9% IV SOLUTION: Freq: Once | INTRAVENOUS | Status: DC

## 2021-05-20 MED ORDER — IOHEXOL 300 MG/ML  SOLN
100.0000 mL | Freq: Once | INTRAMUSCULAR | Status: AC | PRN
  Administered 2021-05-20: 100 mL via INTRAVENOUS

## 2021-05-20 NOTE — Discharge Instructions (Signed)
Follow up with primary care doctor.  Encourage drinking fluids and eating small meals.

## 2021-05-20 NOTE — ED Provider Notes (Signed)
[facility] EMERGENCY DEPARTMENT Provider Note   CSN: [id number] Arrival date & time: 05/20/21  1653     History  Chief Complaint  Patient presents with   Weakness    Danielle Blanchard is a 81 y.o. female w/ hx of parox A Fib presenting to ED with general decline in status.  Her son Danielle Blanchard provides supplemental history by phone.Reports since returning home from [facility] she has had poor appetite, not wanting to eat.  He is frustrated because "She blows me off whenever I tell her she needs to eat."  Patient reports she has no appetite in general.  Son reports she needs continued PT, is not eligible for return to SNF until March 4th.  She uses a walker at baseline.  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 Authorizing Provider  acetaminophen (TYLENOL) 500 MG tablet Take 1,000 mg by mouth every 6 (six) hours as needed for moderate pain or headache.    [provider]  amiodarone (PACERONE) 200 MG tablet Take 200 mg by mouth daily.    [provider]  apixaban (ELIQUIS) 5 MG TABS tablet Take 5 mg by mouth 2 (two) times daily.    [provider]  bisacodyl (DULCOLAX) 10 MG suppository Place 1 suppository (10 mg total) rectally daily as needed for moderate constipation. 03/21/21   Regalado, Belkys A, MD  carvedilol (COREG) 3.125 MG tablet Take 3.125 mg by mouth 2 (two) times daily with a meal.    [provider]  docusate sodium (COLACE) 100 MG capsule Take 1 capsule (100 mg total) by mouth 2 (two) times daily. 03/21/21   Regalado, Belkys A, MD  feeding supplement (ENSURE ENLIVE / ENSURE PLUS) LIQD Take 237 mLs by mouth 4 (four) times daily. 03/21/21   Regalado, Belkys A, MD  lactulose (CHRONULAC) 10 GM/15ML solution Take 30 mLs (20 g total) by  mouth daily. 03/22/21   Regalado, Belkys A, MD  levothyroxine (SYNTHROID) 75 MCG tablet Take 75 mcg by mouth daily before breakfast.    [provider]  lidocaine (LIDODERM) 5 % Place 1 patch onto the skin daily. Remove & Discard patch within 12 hours or as directed by MD 03/22/21   Regalado, Jon Billings A, MD  Melatonin 10 MG TABS Take 10 mg by mouth at bedtime as needed (sleep).    [provider]  pantoprazole (PROTONIX) 40 MG tablet Take 1 tablet (40 mg total) by mouth daily. 03/22/21   Regalado, Belkys A, MD  Polyethyl Glycol-Propyl Glycol (SYSTANE) 0.4-0.3 % GEL ophthalmic gel Place 1 application into both eyes 3 (three) times daily as needed. Patient taking differently: Place 1 application into both eyes 3 (three) times daily as needed (for dry eyes). 02/15/20   Cathie Hoops, Amy V, PA-C  polyethylene glycol (MIRALAX / GLYCOLAX) 17 g packet Take 17 g by mouth daily. 03/22/21   Regalado, Belkys A, MD  rosuvastatin (CRESTOR) 20 MG tablet Take 20 mg by mouth daily.    [provider]  senna (SENOKOT) 8.6 MG TABS tablet Take 1 tablet (8.6 mg total) by mouth daily. 03/22/21   Regalado, Belkys A, MD  traZODone (DESYREL) 50 MG tablet Take 25-50 mg by mouth at bedtime as needed for sleep.    [provider]  Water For  Irrigation, Sterile (FREE WATER) SOLN Place 30 mLs into feeding tube daily. 03/22/21   Regalado, Prentiss BellsBelkys A, MD      Allergies    Patient has no known allergies.    Review of Systems   Review of Systems  Physical Exam Updated Vital Signs BP (!) 113/41    Pulse 64    Temp 98.2 F (36.8 C) (Oral)    Resp 13    Ht 5\' 2"  (1.575 m)    Wt 71 kg    SpO2 97%    BMI 28.63 kg/m  Physical Exam Constitutional:      General: She is not in acute distress. HENT:     Head: Normocephalic and atraumatic.  Eyes:     Conjunctiva/sclera: Conjunctivae normal.     Pupils: Pupils are equal, round, and reactive to light.  Cardiovascular:     Rate and Rhythm: Normal rate and  regular rhythm.  Pulmonary:     Effort: Pulmonary effort is normal. No respiratory distress.  Abdominal:     General: There is no distension.     Tenderness: There is no abdominal tenderness.  Skin:    General: Skin is warm and dry.  Neurological:     General: No focal deficit present.     Mental Status: She is alert. Mental status is at baseline.  Psychiatric:        Mood and Affect: Mood normal.        Behavior: Behavior normal.    ED Results / Procedures / Treatments   Labs (all labs ordered are listed, but only abnormal results are displayed) Labs Reviewed  BASIC METABOLIC PANEL - Abnormal; Notable for the following components:      Result Value   Potassium 3.4 (*)    Glucose, Bld 121 (*)    BUN 34 (*)    Creatinine, Ser 1.11 (*)    GFR, Estimated 50 (*)    All other components within normal limits  CBC - Abnormal; Notable for the following components:   RBC 3.73 (*)    Hemoglobin 11.1 (*)    HCT 34.6 (*)    All other components within normal limits  URINALYSIS, ROUTINE W REFLEX MICROSCOPIC - Abnormal; Notable for the following components:   APPearance HAZY (*)    Leukocytes,Ua TRACE (*)    All other components within normal limits  HEPATIC FUNCTION PANEL - Abnormal; Notable for the following components:   AST 65 (*)    Total Bilirubin 1.9 (*)    Bilirubin, Direct 0.6 (*)    Indirect Bilirubin 1.3 (*)    All other components within normal limits  CBG MONITORING, ED - Abnormal; Notable for the following components:   Glucose-Capillary 118 (*)    All other components within normal limits  RESP PANEL BY RT-PCR (FLU A&B, COVID) ARPGX2  DIFFERENTIAL  TROPONIN I (HIGH SENSITIVITY)    EKG EKG Interpretation  Date/Time:  Saturday May 20 2021 17:24:05 EST Ventricular Rate:  60 PR Interval:  192 QRS Duration: 123 QT Interval:  660 QTC Calculation: 660 R Axis:   110 Text Interpretation: Right and left arm electrode reversal, interpretation assumes no  reversal Sinus rhythm Nonspecific intraventricular conduction delay Baseline wander in lead(s) V6 Confirmed by Alvester Chourifan, Arther Heisler [phone]) on 05/20/2021 8:57:49 PM  Radiology CT ABDOMEN PELVIS W CONTRAST  Result Date: 05/20/2021 CLINICAL DATA:  81 year old female with nausea vomiting, weakness, failure to thrive and hiatal hernia. EXAM: CT ABDOMEN AND PELVIS WITH CONTRAST TECHNIQUE: Multidetector CT  imaging of the abdomen and pelvis was performed using the standard protocol following bolus administration of intravenous contrast. RADIATION DOSE REDUCTION: This exam was performed according to the departmental dose-optimization program which includes automated exposure control, adjustment of the mA and/or kV according to patient size and/or use of iterative reconstruction technique. CONTRAST:  OMNIPAQUE IOHEXOL 300 MG/ML  SOLN COMPARISON:  03/10/2021 prior CTs FINDINGS: Lower chest: No acute abnormality noted. A very large hiatal hernia is again identified. Hepatobiliary: No hepatic abnormalities are identified. The patient is status post cholecystectomy. There is no evidence of intrahepatic or extrahepatic biliary dilatation. Pancreas: Unremarkable Spleen: Unremarkable Adrenals/Urinary Tract: The kidneys, adrenal glands and bladder are unremarkable. Stomach/Bowel: A very large hiatal hernia is again noted. There is no evidence of bowel wall thickening, bowel inflammatory changes or bowel obstruction. Vascular/Lymphatic: Aortic atherosclerosis. No enlarged abdominal or pelvic lymph nodes. Reproductive: Uterus and bilateral adnexa are unremarkable. Other: No ascites, focal collection or pneumoperitoneum. Musculoskeletal: No acute or suspicious bony abnormalities are identified. T12 INFERIOR endplate compression fracture is unchanged. IMPRESSION: 1. No evidence of acute abnormality. 2. Very large hiatal hernia. 3. Aortic Atherosclerosis (ICD10-I70.0). Electronically Signed   By: Harmon Pier M.D.   On: 05/20/2021  21:07    Procedures Procedures    Medications Ordered in ED Medications  sodium chloride 0.9 % bolus 1,000 mL (0 mLs Intravenous Stopped 05/20/21 2343)  iohexol (OMNIPAQUE) 300 MG/ML solution 100 mL (100 mLs Intravenous Contrast Given 05/20/21 2046)    ED Course/ Medical Decision Making/ A&P Clinical Course as of 05/21/21 1018  Sat May 20, 2021  1741 Blood ordered in error, cancelled [MT]  2118 IMPRESSION: 1. No evidence of acute abnormality. 2. Very large hiatal hernia. 3. Aortic Atherosclerosis (ICD10-I70.0). [MT]  2237 Son updated - coming to pick up patient now.  I encouraged patient to drink more and eat at home. [MT]    Clinical Course User Index [MT] Elzena Muston, Kermit Balo, MD                           Medical Decision Making Amount and/or Complexity of Data Reviewed Labs: ordered. Radiology: ordered. ECG/medicine tests: ordered.  Risk Prescription drug management.   This patient presents to the ED with concern for weakness, refusing to eat. This involves an extensive number of treatment options, and is a complaint that carries with it a high risk of complications and morbidity.  The differential diagnosis includes dehydration vs anemia vs recurring GI complication/obstruction vs other.  Co-morbidities that complicate the patient evaluation: likely some dementia component  Additional history obtained from son Danielle Blanchard by phone  External records from outside source obtained and reviewed including most recent hospital course and discharge summary as noted above  I ordered and personally interpreted labs.  The pertinent results include:  Alb wnl, mild increase in BUN/Cr; WBC wnl; no acute anemia; trop 10  I ordered imaging studies including ct abdomen pelvis I independently visualized and interpreted imaging which showed large hiatal hernia, no recurring obstruction I agree with the radiologist interpretation  The patient was maintained on a cardiac monitor.  I  personally viewed and interpreted the cardiac monitored which showed an underlying rhythm of: NSR  Per my interpretation the patient's ECG shows no acute ischemic findings.  Overall my suspicion for atypical ACS is quite low - no chest pain or symptoms.  I ordered medication including IV fluids for hydration I have reviewed the patients home medicines  and have made adjustments as needed  Test Considered:  - Doubt acute PE; did not feel CT PE indicated at this time.  Doubt PNA.  After the interventions noted above, I reevaluated the patient and found that they have: improved  - patient felt better after fluids.  I discussed with her the medical importance of staying hydrated and eating small frequent meals throughout the day (her hiatal hernia may be contributing to her feeling of satiety).  Also discussed with son by phone.  I do not believe there is an emergent indication for hospitalization at this time based on her clinical exam and blood tests.  Social Determinants of Health:SW consult to evaluate for possible home health/PT at home  Dispostion:  After consideration of the diagnostic results and the patients response to treatment, I feel that the patent would benefit from PCP follow up.         Final Clinical Impression(s) / ED Diagnoses Final diagnoses:  Weakness    Rx / DC Orders ED Discharge Orders     None         Terald Sleeper, MD 05/21/21 1018

## 2021-05-20 NOTE — ED Triage Notes (Signed)
Pt arrives POV for eval from home. Son reports pt lives with her and she has been generally doing unwell. Was seen a few mos ago for a hiatal hernia, sent to [facility] for rehab. Son reports last few weeks, pt has been "going downhill". States no energy, no appetite, failure to thrive scenario. Pt reports she feels "full all the time". Denies N/V/D.

## 2021-05-21 NOTE — Care Management (Addendum)
Consult received to set up PT at home [facility] called for acceptance. They will review and call back. [facility] Accepted Called and left confidential Vm to return call . Called and left confidential Vm will son Mellody Dance as well [phone] Iantha Fallen accepted for PT

## 2021-06-03 ENCOUNTER — Inpatient Hospital Stay (HOSPITAL_COMMUNITY)
Admission: EM | Admit: 2021-06-03 | Discharge: 2021-06-06 | DRG: 392 | Disposition: A | Discharge status: Home / Self Care | Payer: Medicare Other | Attending: Family Medicine | Admitting: Family Medicine

## 2021-06-03 ENCOUNTER — Encounter (HOSPITAL_COMMUNITY): Payer: Self-pay | Admitting: Emergency Medicine

## 2021-06-03 ENCOUNTER — Emergency Department (HOSPITAL_COMMUNITY): Payer: Medicare Other

## 2021-06-03 ENCOUNTER — Other Ambulatory Visit: Payer: Self-pay

## 2021-06-03 DIAGNOSIS — Z79899 Other long term (current) drug therapy: Secondary | ICD-10-CM

## 2021-06-03 DIAGNOSIS — G4700 Insomnia, unspecified: Secondary | ICD-10-CM | POA: Diagnosis present

## 2021-06-03 DIAGNOSIS — Z950 Presence of cardiac pacemaker: Secondary | ICD-10-CM | POA: Diagnosis present

## 2021-06-03 DIAGNOSIS — Z9889 Other specified postprocedural states: Secondary | ICD-10-CM

## 2021-06-03 DIAGNOSIS — I10 Essential (primary) hypertension: Secondary | ICD-10-CM | POA: Diagnosis present

## 2021-06-03 DIAGNOSIS — K440 Diaphragmatic hernia with obstruction, without gangrene: Principal | ICD-10-CM | POA: Diagnosis present

## 2021-06-03 DIAGNOSIS — R110 Nausea: Secondary | ICD-10-CM

## 2021-06-03 DIAGNOSIS — E039 Hypothyroidism, unspecified: Secondary | ICD-10-CM

## 2021-06-03 DIAGNOSIS — I4891 Unspecified atrial fibrillation: Secondary | ICD-10-CM | POA: Diagnosis present

## 2021-06-03 DIAGNOSIS — N179 Acute kidney failure, unspecified: Secondary | ICD-10-CM | POA: Diagnosis present

## 2021-06-03 DIAGNOSIS — R413 Other amnesia: Secondary | ICD-10-CM | POA: Diagnosis present

## 2021-06-03 DIAGNOSIS — I495 Sick sinus syndrome: Secondary | ICD-10-CM | POA: Diagnosis present

## 2021-06-03 DIAGNOSIS — Z7901 Long term (current) use of anticoagulants: Secondary | ICD-10-CM

## 2021-06-03 DIAGNOSIS — E7800 Pure hypercholesterolemia, unspecified: Secondary | ICD-10-CM | POA: Diagnosis present

## 2021-06-03 DIAGNOSIS — Z931 Gastrostomy status: Secondary | ICD-10-CM

## 2021-06-03 DIAGNOSIS — E878 Other disorders of electrolyte and fluid balance, not elsewhere classified: Secondary | ICD-10-CM | POA: Diagnosis present

## 2021-06-03 DIAGNOSIS — I480 Paroxysmal atrial fibrillation: Secondary | ICD-10-CM | POA: Diagnosis present

## 2021-06-03 DIAGNOSIS — D509 Iron deficiency anemia, unspecified: Secondary | ICD-10-CM

## 2021-06-03 DIAGNOSIS — R748 Abnormal levels of other serum enzymes: Secondary | ICD-10-CM | POA: Diagnosis present

## 2021-06-03 DIAGNOSIS — I5032 Chronic diastolic (congestive) heart failure: Secondary | ICD-10-CM

## 2021-06-03 DIAGNOSIS — R627 Adult failure to thrive: Secondary | ICD-10-CM | POA: Diagnosis present

## 2021-06-03 DIAGNOSIS — E876 Hypokalemia: Secondary | ICD-10-CM | POA: Diagnosis not present

## 2021-06-03 DIAGNOSIS — I110 Hypertensive heart disease with heart failure: Secondary | ICD-10-CM | POA: Diagnosis present

## 2021-06-03 DIAGNOSIS — E785 Hyperlipidemia, unspecified: Secondary | ICD-10-CM | POA: Diagnosis present

## 2021-06-03 DIAGNOSIS — K449 Diaphragmatic hernia without obstruction or gangrene: Secondary | ICD-10-CM

## 2021-06-03 DIAGNOSIS — R638 Other symptoms and signs concerning food and fluid intake: Secondary | ICD-10-CM

## 2021-06-03 DIAGNOSIS — E860 Dehydration: Secondary | ICD-10-CM | POA: Diagnosis present

## 2021-06-03 DIAGNOSIS — R1013 Epigastric pain: Secondary | ICD-10-CM

## 2021-06-03 DIAGNOSIS — Z79890 Hormone replacement therapy (postmenopausal): Secondary | ICD-10-CM

## 2021-06-03 DIAGNOSIS — Z20822 Contact with and (suspected) exposure to covid-19: Secondary | ICD-10-CM | POA: Diagnosis present

## 2021-06-03 LAB — COMPREHENSIVE METABOLIC PANEL
ALT: 46 U/L — ABNORMAL HIGH (ref 0–44)
ALT: 38 U/L (ref 0–44)
AST: 57 U/L — ABNORMAL HIGH (ref 15–41)
AST: 65 U/L — ABNORMAL HIGH (ref 15–41)
Albumin: 3.8 g/dL (ref 3.5–5.0)
Albumin: 3.3 g/dL — ABNORMAL LOW (ref 3.5–5.0)
Alkaline Phosphatase: 96 U/L (ref 38–126)
Alkaline Phosphatase: 85 U/L (ref 38–126)
Anion gap: 10 (ref 5–15)
Anion gap: 8 (ref 5–15)
BUN: 19 mg/dL (ref 8–23)
BUN: 18 mg/dL (ref 8–23)
CO2: 33 mmol/L — ABNORMAL HIGH (ref 22–32)
CO2: 32 mmol/L (ref 22–32)
Calcium: 8.7 mg/dL — ABNORMAL LOW (ref 8.9–10.3)
Calcium: 8 mg/dL — ABNORMAL LOW (ref 8.9–10.3)
Chloride: 91 mmol/L — ABNORMAL LOW (ref 98–111)
Chloride: 92 mmol/L — ABNORMAL LOW (ref 98–111)
Creatinine, Ser: 1.55 mg/dL — ABNORMAL HIGH (ref 0.44–1.00)
Creatinine, Ser: 1.34 mg/dL — ABNORMAL HIGH (ref 0.44–1.00)
GFR, Estimated: 33 mL/min — ABNORMAL LOW (ref >60)
GFR, Estimated: 40 mL/min — ABNORMAL LOW (ref >60)
Glucose, Bld: 101 mg/dL — ABNORMAL HIGH (ref 70–99)
Glucose, Bld: 103 mg/dL — ABNORMAL HIGH (ref 70–99)
Potassium: <2 mmol/L — CL (ref 3.5–5.1)
Potassium: 3.3 mmol/L — ABNORMAL LOW (ref 3.5–5.1)
Sodium: 134 mmol/L — ABNORMAL LOW (ref 135–145)
Sodium: 132 mmol/L — ABNORMAL LOW (ref 135–145)
Total Bilirubin: 0.8 mg/dL (ref 0.3–1.2)
Total Bilirubin: 1.7 mg/dL — ABNORMAL HIGH (ref 0.3–1.2)
Total Protein: 7.1 g/dL (ref 6.5–8.1)
Total Protein: 6.5 g/dL (ref 6.5–8.1)

## 2021-06-03 LAB — CBC WITH DIFFERENTIAL/PLATELET
Abs Immature Granulocytes: 0.02 10*3/uL (ref 0.00–0.07)
Basophils Absolute: 0 10*3/uL (ref 0.0–0.1)
Basophils Relative: 1 %
Eosinophils Absolute: 0.1 10*3/uL (ref 0.0–0.5)
Eosinophils Relative: 1 %
HCT: 33.1 % — ABNORMAL LOW (ref 36.0–46.0)
Hemoglobin: 11 g/dL — ABNORMAL LOW (ref 12.0–15.0)
Immature Granulocytes: 0 %
Lymphocytes Relative: 23 %
Lymphs Abs: 1.4 10*3/uL (ref 0.7–4.0)
MCH: 30.1 pg (ref 26.0–34.0)
MCHC: 33.2 g/dL (ref 30.0–36.0)
MCV: 90.7 fL (ref 80.0–100.0)
Monocytes Absolute: 0.7 10*3/uL (ref 0.1–1.0)
Monocytes Relative: 12 %
Neutro Abs: 3.7 10*3/uL (ref 1.7–7.7)
Neutrophils Relative %: 63 %
Platelets: 187 10*3/uL (ref 150–400)
RBC: 3.65 MIL/uL — ABNORMAL LOW (ref 3.87–5.11)
RDW: 13.9 % (ref 11.5–15.5)
WBC: 5.9 10*3/uL (ref 4.0–10.5)
nRBC: 0 % (ref 0.0–0.2)

## 2021-06-03 LAB — TSH: TSH: 0.136 u[IU]/mL — ABNORMAL LOW (ref 0.350–4.500)

## 2021-06-03 LAB — URINALYSIS, ROUTINE W REFLEX MICROSCOPIC
Bacteria, UA: NONE SEEN
Bilirubin Urine: NEGATIVE
Glucose, UA: NEGATIVE mg/dL
Ketones, ur: NEGATIVE mg/dL
Nitrite: NEGATIVE
Protein, ur: NEGATIVE mg/dL
Specific Gravity, Urine: 1.025 (ref 1.005–1.030)
pH: 5 (ref 5.0–8.0)

## 2021-06-03 LAB — LIPASE, BLOOD: Lipase: 38 U/L (ref 11–51)

## 2021-06-03 LAB — RESP PANEL BY RT-PCR (FLU A&B, COVID) ARPGX2
Influenza A by PCR: NEGATIVE
Influenza B by PCR: NEGATIVE
SARS Coronavirus 2 by RT PCR: NEGATIVE

## 2021-06-03 LAB — BRAIN NATRIURETIC PEPTIDE: B Natriuretic Peptide: 106.3 pg/mL — ABNORMAL HIGH (ref 0.0–100.0)

## 2021-06-03 LAB — MAGNESIUM: Magnesium: 2.5 mg/dL — ABNORMAL HIGH (ref 1.7–2.4)

## 2021-06-03 LAB — LACTIC ACID, PLASMA: Lactic Acid, Venous: 1.9 mmol/L (ref 0.5–1.9)

## 2021-06-03 MED ORDER — ONDANSETRON HCL 4 MG/2ML IJ SOLN
4.0000 mg | Freq: Once | INTRAMUSCULAR | Status: AC
Start: 2021-06-03 — End: 2021-06-03
  Administered 2021-06-03: 4 mg via INTRAVENOUS
  Filled 2021-06-03: qty 2

## 2021-06-03 MED ORDER — POTASSIUM CHLORIDE 10 MEQ/100ML IV SOLN
10.0000 meq | INTRAVENOUS | Status: DC
  Administered 2021-06-03: 10 meq via INTRAVENOUS
  Filled 2021-06-03 (×3): qty 100

## 2021-06-03 MED ORDER — IOHEXOL 300 MG/ML  SOLN
75.0000 mL | Freq: Once | INTRAMUSCULAR | Status: AC | PRN
Start: 2021-06-03 — End: 2021-06-03
  Administered 2021-06-03: 75 mL via INTRAVENOUS

## 2021-06-03 MED ORDER — SODIUM CHLORIDE 0.9 % IV BOLUS
500.0000 mL | Freq: Once | INTRAVENOUS | Status: AC
  Administered 2021-06-03: 500 mL via INTRAVENOUS

## 2021-06-03 NOTE — Consult Note (Signed)
Reason for Consult:hiatal hernia Referring Physician: Dr Sherry Ruffing  Danielle Blanchard is an 81 y.o. female.  HPI: 44 yof who went to OR on 12/13 with Dr Hassell Done. It is listed as a robotic HH repair but this appears incorrect.  She had stomach takedown with type III PEH and then tacked down with a PEG tube.  This has since been removed.  She has a scan 2/18 and then one now that shows [place] still present.  She had her g tube removed in our office several weeks ago.  She was not using g tube to vent as I dont think she knew it was option.  She has had poor appetite, nausea, feels full early. No vomiting, has bowel function Brought to ER. She has K <2/0, Cr 1.55, wbc and lactate normal.   Past Medical History:  Diagnosis Date   High cholesterol    Hypertension     Past Surgical History:  Procedure Laterality Date   ESOPHAGOGASTRODUODENOSCOPY (EGD) WITH PROPOFOL N/A 02/13/2021   Procedure: ESOPHAGOGASTRODUODENOSCOPY (EGD) WITH PROPOFOL;  Surgeon: Arta Silence, MD;  Location: [facility];  Service: Gastroenterology;  Laterality: N/A;   PACEMAKER PLACEMENT     PEG PLACEMENT N/A 03/14/2021   Procedure: PERCUTANEOUS ENDOSCOPIC GASTROSTOMY (PEG) PLACEMENT;  Surgeon: Johnathan Hausen, MD;  Location: [facility];  Service: General;  Laterality: N/A;   XI ROBOTIC ASSISTED HIATAL HERNIA REPAIR N/A 03/14/2021   Procedure: XI ROBOTIC ASSISTED TAKEDOWN OF INCARCERATED HIATAL HERNIA;  Surgeon: Johnathan Hausen, MD;  Location: [facility];  Service: General;  Laterality: N/A;    No family history on file.  Social History:  reports that she has never smoked. She has never used smokeless tobacco. She reports that she does not drink alcohol and does not use drugs.  Allergies: No Known Allergies  Medications: I have reviewed the patient's current medications. No current facility-administered medications on file prior to encounter.   Current Outpatient Medications on File Prior to Encounter  Medication Sig Dispense Refill    acetaminophen (TYLENOL) 500 MG tablet Take 500 mg by mouth every 6 (six) hours as needed for moderate pain or headache.     amiodarone (PACERONE) 200 MG tablet Take 200 mg by mouth daily.     apixaban (ELIQUIS) 5 MG TABS tablet Take 5 mg by mouth 2 (two) times daily.     carvedilol (COREG) 3.125 MG tablet Take 3.125 mg by mouth 2 (two) times daily with a meal.     docusate sodium (COLACE) 100 MG capsule Take 1 capsule (100 mg total) by mouth 2 (two) times daily. (Patient taking differently: Take 100 mg by mouth daily as needed for mild constipation.) 10 capsule 0   feeding supplement (ENSURE ENLIVE / ENSURE PLUS) LIQD Take 237 mLs by mouth 4 (four) times daily. (Patient taking differently: Take 237 mLs by mouth daily.) 237 mL 12   lactulose (CHRONULAC) 10 GM/15ML solution Take 30 mLs (20 g total) by mouth daily. (Patient taking differently: Take 20 g by mouth daily as needed for moderate constipation.) 236 mL 0   levothyroxine (SYNTHROID) 75 MCG tablet Take 75 mcg by mouth daily before breakfast.     lidocaine (LIDODERM) 5 % Place 1 patch onto the skin daily. Remove & Discard patch within 12 hours or as directed by MD 30 patch 0   Melatonin 10 MG TABS Take 10 mg by mouth at bedtime.     pantoprazole (PROTONIX) 40 MG tablet Take 1 tablet (40 mg total) by mouth daily. (Patient  taking differently: Take 40 mg by mouth daily as needed (heartburn).) 30 tablet 0   Polyethyl Glycol-Propyl Glycol (SYSTANE) 0.4-0.3 % GEL ophthalmic gel Place 1 application into both eyes 3 (three) times daily as needed. (Patient taking differently: Place 1 application into both eyes 3 (three) times daily as needed (for dry eyes).) 8 mL 0   rosuvastatin (CRESTOR) 20 MG tablet Take 20 mg by mouth daily.     senna (SENOKOT) 8.6 MG TABS tablet Take 1 tablet (8.6 mg total) by mouth daily. (Patient taking differently: Take 1 tablet by mouth daily as needed for moderate constipation.) 120 tablet 0   traMADol-acetaminophen (ULTRACET)  37.5-325 MG tablet Take 1 tablet by mouth every 6 (six) hours as needed for severe pain.     bisacodyl (DULCOLAX) 10 MG suppository Place 1 suppository (10 mg total) rectally daily as needed for moderate constipation. (Patient not taking: Reported on 06/03/2021) 12 suppository 0   polyethylene glycol (MIRALAX / GLYCOLAX) 17 g packet Take 17 g by mouth daily. (Patient not taking: Reported on 06/03/2021) 14 each 0   Water For Irrigation, Sterile (FREE WATER) SOLN Place 30 mLs into feeding tube daily. (Patient not taking: Reported on 06/03/2021) 100 mL 0    Results for orders placed or performed during the hospital encounter of 06/03/21 (from the past 48 hour(s))  CBC with Differential     Status: Abnormal   Collection Time: 06/03/21  6:16 PM  Result Value Ref Range   WBC 5.9 4.0 - 10.5 K/uL   RBC 3.65 (L) 3.87 - 5.11 MIL/uL   Hemoglobin 11.0 (L) 12.0 - 15.0 g/dL   HCT 33.1 (L) 36.0 - 46.0 %   MCV 90.7 80.0 - 100.0 fL   MCH 30.1 26.0 - 34.0 pg   MCHC 33.2 30.0 - 36.0 g/dL   RDW 13.9 11.5 - 15.5 %   Platelets 187 150 - 400 K/uL   nRBC 0.0 0.0 - 0.2 %   Neutrophils Relative % 63 %   Neutro Abs 3.7 1.7 - 7.7 K/uL   Lymphocytes Relative 23 %   Lymphs Abs 1.4 0.7 - 4.0 K/uL   Monocytes Relative 12 %   Monocytes Absolute 0.7 0.1 - 1.0 K/uL   Eosinophils Relative 1 %   Eosinophils Absolute 0.1 0.0 - 0.5 K/uL   Basophils Relative 1 %   Basophils Absolute 0.0 0.0 - 0.1 K/uL   Immature Granulocytes 0 %   Abs Immature Granulocytes 0.02 0.00 - 0.07 K/uL    Comment: Performed at [facility], [place] [address], [place], [place] [address]  Comprehensive metabolic panel     Status: Abnormal   Collection Time: 06/03/21  6:16 PM  Result Value Ref Range   Sodium 134 (L) 135 - 145 mmol/L   Potassium <2.0 (LL) 3.5 - 5.1 mmol/L    Comment: CRITICAL RESULT CALLED TO, READ BACK BY AND VERIFIED WITH: JAKE, RN @ 1933 ON 06/03/2021 BY LBROOKS, MLT    Chloride 91 (L) 98 - 111 mmol/L   CO2 33  (H) 22 - 32 mmol/L   Glucose, Bld 101 (H) 70 - 99 mg/dL    Comment: Glucose reference range applies only to samples taken after fasting for at least 8 hours.   BUN 19 8 - 23 mg/dL   Creatinine, Ser 1.55 (H) 0.44 - 1.00 mg/dL   Calcium 8.7 (L) 8.9 - 10.3 mg/dL   Total Protein 7.1 6.5 - 8.1 g/dL   Albumin 3.8 3.5 -  5.0 g/dL   AST 57 (H) 15 - 41 U/L   ALT 46 (H) 0 - 44 U/L   Alkaline Phosphatase 96 38 - 126 U/L   Total Bilirubin 0.8 0.3 - 1.2 mg/dL   GFR, Estimated 33 (L) >60 mL/min    Comment: (NOTE) Calculated using the CKD-EPI Creatinine Equation (2021)    Anion gap 10 5 - 15    Comment: Performed at [facility], [place] [address], [place], [place] [address]  Lipase, blood     Status: None   Collection Time: 06/03/21  6:16 PM  Result Value Ref Range   Lipase 38 11 - 51 U/L    Comment: Performed at [facility] [place], [place] [address] [place], [place] [address]  Magnesium     Status: Abnormal   Collection Time: 06/03/21  6:16 PM  Result Value Ref Range   Magnesium 2.5 (H) 1.7 - 2.4 mg/dL    Comment: Performed at [facility], [place] [address], [place], [place] [address]  Brain natriuretic peptide     Status: Abnormal   Collection Time: 06/03/21  6:16 PM  Result Value Ref Range   B Natriuretic Peptide 106.3 (H) 0.0 - 100.0 pg/mL    Comment: Performed at [facility], [place] [address] [place], [place] [address]  Lactic acid, plasma     Status: None   Collection Time: 06/03/21  6:27 PM  Result Value Ref Range   Lactic Acid, Venous 1.9 0.5 - 1.9 mmol/L    Comment: Performed at [facility], [place] [address] [place], [place] [address]  TSH     Status: Abnormal   Collection Time: 06/03/21  6:27 PM  Result Value Ref Range   TSH 0.136 (L) 0.350 - 4.500 uIU/mL    Comment: Performed by a 3rd Generation assay with a functional sensitivity of <=0.01 uIU/mL. Performed at [facility], [place] [address], [place], [place] [address]   Resp Panel by RT-PCR (Flu A&B, Covid) Nasopharyngeal Swab     Status: None   Collection Time: 06/03/21  6:27 PM   Specimen: Nasopharyngeal Swab; Nasopharyngeal(NP) swabs in vial transport medium  Result Value Ref Range   SARS Coronavirus 2 by RT PCR NEGATIVE NEGATIVE    Comment: (NOTE) SARS-CoV-2 target nucleic acids are NOT DETECTED.  The SARS-CoV-2 RNA is generally detectable in upper respiratory specimens during the acute phase of infection. The lowest concentration of SARS-CoV-2 viral copies this assay can detect is 138 copies/mL. A negative result does not preclude SARS-Cov-2 infection and should not be used as the sole basis for treatment or other patient management decisions. A negative result may occur with  improper specimen collection/handling, submission of specimen other than nasopharyngeal swab, presence of viral mutation(s) within the areas targeted by this assay, and inadequate number of viral copies(<138 copies/mL). A negative result must be combined with clinical observations, patient history, and epidemiological information. The expected result is Negative.  Fact Sheet for Patients:  EntrepreneurPulse.com.au  Fact Sheet for Healthcare Providers:  IncredibleEmployment.be  This test is no t yet approved or cleared by the [place] FDA and  has been authorized for detection and/or diagnosis of SARS-CoV-2 by FDA under an Emergency Use Authorization (EUA). This EUA will remain  in effect (meaning this test can be used) for the duration of the COVID-19 declaration under Section 564(b)(1) of the Act, 21 U.S.C.section 360bbb-3(b)(1), unless the authorization is terminated  or revoked sooner.  Influenza A by PCR NEGATIVE NEGATIVE   Influenza B by PCR NEGATIVE NEGATIVE    Comment: (NOTE) The Xpert Xpress SARS-CoV-2/FLU/RSV plus assay is intended as an aid in the diagnosis of  influenza from Nasopharyngeal swab specimens and should not be used as a sole basis for treatment. Nasal washings and aspirates are unacceptable for Xpert Xpress SARS-CoV-2/FLU/RSV testing.  Fact Sheet for Patients: BloggerCourse.com  Fact Sheet for Healthcare Providers: SeriousBroker.it  This test is not yet approved or cleared by the [place] FDA and has been authorized for detection and/or diagnosis of SARS-CoV-2 by FDA under an Emergency Use Authorization (EUA). This EUA will remain in effect (meaning this test can be used) for the duration of the COVID-19 declaration under Section 564(b)(1) of the Act, 21 U.S.C. section 360bbb-3(b)(1), unless the authorization is terminated or revoked.  Performed at [facility], [address], [place], [place] [address]     CT ABDOMEN PELVIS W CONTRAST  Result Date: 06/03/2021 CLINICAL DATA:  Evaluate for bowel obstruction. Previous hiatal hernia volvulus status post surgery. EXAM: CT ABDOMEN AND PELVIS WITH CONTRAST TECHNIQUE: Multidetector CT imaging of the abdomen and pelvis was performed using the standard protocol following bolus administration of intravenous contrast. RADIATION DOSE REDUCTION: This exam was performed according to the departmental dose-optimization program which includes automated exposure control, adjustment of the mA and/or kV according to patient size and/or use of iterative reconstruction technique. CONTRAST:  55mL OMNIPAQUE IOHEXOL 300 MG/ML  SOLN COMPARISON:  CT abdomen and pelvis 05/20/2021. FINDINGS: Lower chest: Chronic peripheral reticular opacities are unchanged in both lung bases. Hepatobiliary: No focal liver abnormality is seen. Status post cholecystectomy. No biliary dilatation. Pancreas: Unremarkable. No pancreatic ductal dilatation or surrounding inflammatory changes. Spleen: Normal in size without focal abnormality. Adrenals/Urinary Tract:  Adrenal glands are unremarkable. Kidneys are normal, without renal calculi, focal lesion, or hydronephrosis. Bladder is unremarkable. Stomach/Bowel: Stomach and small bowel are nondilated. There is sigmoid colon diverticulosis without evidence for acute diverticulitis. The appendix is within normal limits. Again seen is a large hiatal hernia containing mid and proximal stomach. The intrathoracic portion of the stomach is mildly distended with air. There is some wall thickening and surrounding fluid at the level of the proximal stomach incompletely imaged. Vascular/Lymphatic: Aortic atherosclerosis. No enlarged abdominal or pelvic lymph nodes. Reproductive: Uterus and bilateral adnexa are unremarkable. Other: No ascites or free air. Small fat containing inguinal hernias bilaterally. Musculoskeletal: Stable compression deformity of T12. The bones are diffusely osteopenic. IMPRESSION: 1. Again seen is a large hiatal hernia containing the proximal stomach. The intrathoracic portion of the stomach is mildly distended with air. There is some wall thickening and surrounding fluid involving the proximal stomach (incompletely imaged). Correlate clinically for gastritis. Surrounding free fluid may be related to recent surgery. Differential diagnosis would include perforation in the appropriate clinical setting. Electronically Signed   By: Darliss Cheney M.D.   On: 06/03/2021 20:12    Review of Systems  Constitutional:  Positive for appetite change and fatigue.  Gastrointestinal:  Positive for abdominal pain.  All other systems reviewed and are negative. Blood pressure (!) 109/58, pulse (!) 59, temperature 97.6 F (36.4 C), temperature source Oral, resp. rate 19, SpO2 97 %. Physical Exam Eyes:     General: No scleral icterus. Cardiovascular:     Rate and Rhythm: Normal rate.  Pulmonary:     Effort: Pulmonary effort is normal.  Abdominal:     Tenderness: There is no abdominal tenderness.  Hernia: No hernia is  present.     Comments: Incisions healed  Neurological:     General: No focal deficit present.     Mental Status: She is alert.  Psychiatric:        Mood and Affect: Mood normal.        Behavior: Behavior normal.    Assessment/Plan: Failure to thrive -multiple reasons, I think she still has symptoms related to hiatal hernia but does not have volvulus -consider ugi while here and will discuss with her operating surgeon I reviewed ED provider notes, last 24 h vitals and pain scores, last 24 h labs and trends, and last 24 h imaging results.  This care required moderate level of medical decision making.   Rolm Bookbinder 06/03/2021, 9:46 PM

## 2021-06-03 NOTE — ED Provider Notes (Signed)
[facility]-EMERGENCY DEPT Provider Note   CSN: [id number] Arrival date & time: 06/03/21  1747     History  Chief Complaint  Patient presents with   Nausea    Danielle Blanchard is a 81 y.o. female.  The history is provided by the patient, a relative and medical records. No language interpreter was used.  Abdominal Pain Pain location:  Epigastric Pain quality: aching and cramping   Pain quality: not burning   Pain radiates to:  Does not radiate Pain severity:  Moderate Onset quality:  Gradual Timing:  Constant Progression:  Unchanged Chronicity:  Recurrent Context: previous surgery   Context: not eating, not recent illness, not recent travel and not trauma   Relieved by:  Nothing Worsened by:  Palpation Ineffective treatments:  None tried Associated symptoms: anorexia, constipation, fatigue and nausea   Associated symptoms: no chest pain, no chills, no cough, no diarrhea, no dysuria, no fever, no flatus, no hematemesis, no hematochezia, no hematuria, no shortness of breath and no vomiting       Home Medications Prior to Admission medications   Medication Sig Start Date End Date Taking? Authorizing Provider  acetaminophen (TYLENOL) 500 MG tablet Take 1,000 mg by mouth every 6 (six) hours as needed for moderate pain or headache.    [provider]  amiodarone (PACERONE) 200 MG tablet Take 200 mg by mouth daily.    [provider]  apixaban (ELIQUIS) 5 MG TABS tablet Take 5 mg by mouth 2 (two) times daily.    [provider]  bisacodyl (DULCOLAX) 10 MG suppository Place 1 suppository (10 mg total) rectally daily as needed for moderate constipation. 03/21/21   Regalado, Belkys A, MD  carvedilol (COREG) 3.125 MG tablet Take 3.125 mg by mouth 2 (two) times daily with a meal.    [provider]  docusate sodium (COLACE) 100 MG capsule Take 1 capsule (100 mg total) by mouth 2 (two) times daily. 03/21/21   Regalado, Belkys A, MD   feeding supplement (ENSURE ENLIVE / ENSURE PLUS) LIQD Take 237 mLs by mouth 4 (four) times daily. 03/21/21   Regalado, Belkys A, MD  lactulose (CHRONULAC) 10 GM/15ML solution Take 30 mLs (20 g total) by mouth daily. 03/22/21   Regalado, Belkys A, MD  levothyroxine (SYNTHROID) 75 MCG tablet Take 75 mcg by mouth daily before breakfast.    [provider]  lidocaine (LIDODERM) 5 % Place 1 patch onto the skin daily. Remove & Discard patch within 12 hours or as directed by MD 03/22/21   Regalado, Jon Billings A, MD  Melatonin 10 MG TABS Take 10 mg by mouth at bedtime as needed (sleep).    [provider]  pantoprazole (PROTONIX) 40 MG tablet Take 1 tablet (40 mg total) by mouth daily. 03/22/21   Regalado, Belkys A, MD  Polyethyl Glycol-Propyl Glycol (SYSTANE) 0.4-0.3 % GEL ophthalmic gel Place 1 application into both eyes 3 (three) times daily as needed. Patient taking differently: Place 1 application into both eyes 3 (three) times daily as needed (for dry eyes). 02/15/20   Cathie Hoops, Amy V, PA-C  polyethylene glycol (MIRALAX / GLYCOLAX) 17 g packet Take 17 g by mouth daily. 03/22/21   Regalado, Belkys A, MD  rosuvastatin (CRESTOR) 20 MG tablet Take 20 mg by mouth daily.    [provider]  senna (SENOKOT) 8.6 MG TABS tablet Take 1 tablet (8.6 mg total) by mouth daily. 03/22/21   Regalado, Jon Billings A, MD  traZODone (DESYREL) 50 MG  tablet Take 25-50 mg by mouth at bedtime as needed for sleep.    [provider]  Water For Irrigation, Sterile (FREE WATER) SOLN Place 30 mLs into feeding tube daily. 03/22/21   Regalado, Prentiss Bells, MD      Allergies    Patient has no known allergies.    Review of Systems   Review of Systems  Constitutional:  Positive for fatigue. Negative for chills and fever.  HENT:  Negative for congestion.   Respiratory:  Negative for cough, chest tightness, shortness of breath, wheezing and stridor.   Cardiovascular:  Negative for chest pain, palpitations and  leg swelling.  Gastrointestinal:  Positive for abdominal pain, anorexia, constipation and nausea. Negative for abdominal distention, diarrhea, flatus, hematemesis, hematochezia and vomiting.  Genitourinary:  Negative for dysuria, flank pain, frequency and hematuria.  Musculoskeletal:  Negative for back pain, neck pain and neck stiffness.  Skin:  Negative for rash and wound.  Neurological:  Negative for weakness, light-headedness, numbness and headaches.  Psychiatric/Behavioral:  Negative for agitation and confusion.   All other systems reviewed and are negative.  Physical Exam Updated Vital Signs BP 133/76 (BP Location: Left Arm)   Pulse 67   Temp 97.6 F (36.4 C) (Oral)   Resp 16   SpO2 99%  Physical Exam Vitals and nursing note reviewed.  Constitutional:      General: She is not in acute distress.    Appearance: She is well-developed. She is not ill-appearing, toxic-appearing or diaphoretic.  HENT:     Head: Normocephalic and atraumatic.     Nose: No congestion or rhinorrhea.     Mouth/Throat:     Mouth: Mucous membranes are moist.     Pharynx: No oropharyngeal exudate or posterior oropharyngeal erythema.  Eyes:     Conjunctiva/sclera: Conjunctivae normal.     Pupils: Pupils are equal, round, and reactive to light.  Cardiovascular:     Rate and Rhythm: Normal rate and regular rhythm.     Heart sounds: No murmur heard. Pulmonary:     Effort: Pulmonary effort is normal. No respiratory distress.     Breath sounds: Normal breath sounds. No wheezing, rhonchi or rales.  Chest:     Chest wall: No tenderness.  Abdominal:     General: Abdomen is flat. There is no distension.     Palpations: Abdomen is soft.     Tenderness: There is abdominal tenderness. There is no right CVA tenderness, left CVA tenderness, guarding or rebound.  Musculoskeletal:        General: No swelling or tenderness.     Cervical back: Neck supple. No tenderness.     Right lower leg: No edema.     Left  lower leg: No edema.  Skin:    General: Skin is warm and dry.     Capillary Refill: Capillary refill takes less than 2 seconds.     Findings: No erythema.  Neurological:     General: No focal deficit present.     Mental Status: She is alert.     Sensory: No sensory deficit.     Motor: No weakness.  Psychiatric:        Mood and Affect: Mood normal.    ED Results / Procedures / Treatments   Labs (all labs ordered are listed, but only abnormal results are displayed) Labs Reviewed  CBC WITH DIFFERENTIAL/PLATELET - Abnormal; Notable for the following components:      Result Value   RBC 3.65 (*)  Hemoglobin 11.0 (*)    HCT 33.1 (*)    All other components within normal limits  COMPREHENSIVE METABOLIC PANEL - Abnormal; Notable for the following components:   Sodium 134 (*)    Potassium <2.0 (*)    Chloride 91 (*)    CO2 33 (*)    Glucose, Bld 101 (*)    Creatinine, Ser 1.55 (*)    Calcium 8.7 (*)    AST 57 (*)    ALT 46 (*)    GFR, Estimated 33 (*)    All other components within normal limits  TSH - Abnormal; Notable for the following components:   TSH 0.136 (*)    All other components within normal limits  URINALYSIS, ROUTINE W REFLEX MICROSCOPIC - Abnormal; Notable for the following components:   Color, Urine STRAW (*)    Hgb urine dipstick SMALL (*)    Leukocytes,Ua TRACE (*)    All other components within normal limits  MAGNESIUM - Abnormal; Notable for the following components:   Magnesium 2.5 (*)    All other components within normal limits  BRAIN NATRIURETIC PEPTIDE - Abnormal; Notable for the following components:   B Natriuretic Peptide 106.3 (*)    All other components within normal limits  COMPREHENSIVE METABOLIC PANEL - Abnormal; Notable for the following components:   Sodium 132 (*)    Potassium 3.3 (*)    Chloride 92 (*)    Glucose, Bld 103 (*)    Creatinine, Ser 1.34 (*)    Calcium 8.0 (*)    Albumin 3.3 (*)    AST 65 (*)    Total Bilirubin 1.7 (*)     GFR, Estimated 40 (*)    All other components within normal limits  RESP PANEL BY RT-PCR (FLU A&B, COVID) [id number]  URINE CULTURE  LIPASE, BLOOD  LACTIC ACID, PLASMA  LACTIC ACID, PLASMA    EKG EKG Interpretation  Date/Time:  Saturday June 03 2021 19:40:58 EST Ventricular Rate:  60 PR Interval:  201 QRS Duration: 134 QT Interval:  675 QTC Calculation: 675 R Axis:   -24 Text Interpretation: Atrial-paced rhythm Nonspecific intraventricular conduction delay Nonspecific T abnormalities, anterior leads when compared to prior, improving QTc. No sTEMI Confirmed by Theda Belfast ([id number]) on 06/03/2021 7:42:57 PM  Radiology CT ABDOMEN PELVIS W CONTRAST  Result Date: 06/03/2021 CLINICAL DATA:  Evaluate for bowel obstruction. Previous hiatal hernia volvulus status post surgery. EXAM: CT ABDOMEN AND PELVIS WITH CONTRAST TECHNIQUE: Multidetector CT imaging of the abdomen and pelvis was performed using the standard protocol following bolus administration of intravenous contrast. RADIATION DOSE REDUCTION: This exam was performed according to the departmental dose-optimization program which includes automated exposure control, adjustment of the mA and/or kV according to patient size and/or use of iterative reconstruction technique. CONTRAST:  75mL OMNIPAQUE IOHEXOL 300 MG/ML  SOLN COMPARISON:  CT abdomen and pelvis 05/20/2021. FINDINGS: Lower chest: Chronic peripheral reticular opacities are unchanged in both lung bases. Hepatobiliary: No focal liver abnormality is seen. Status post cholecystectomy. No biliary dilatation. Pancreas: Unremarkable. No pancreatic ductal dilatation or surrounding inflammatory changes. Spleen: Normal in size without focal abnormality. Adrenals/Urinary Tract: Adrenal glands are unremarkable. Kidneys are normal, without renal calculi, focal lesion, or hydronephrosis. Bladder is unremarkable. Stomach/Bowel: Stomach and small bowel are nondilated. There is sigmoid colon diverticulosis  without evidence for acute diverticulitis. The appendix is within normal limits. Again seen is a large hiatal hernia containing mid and proximal stomach. The intrathoracic portion of the stomach is mildly distended  with air. There is some wall thickening and surrounding fluid at the level of the proximal stomach incompletely imaged. Vascular/Lymphatic: Aortic atherosclerosis. No enlarged abdominal or pelvic lymph nodes. Reproductive: Uterus and bilateral adnexa are unremarkable. Other: No ascites or free air. Small fat containing inguinal hernias bilaterally. Musculoskeletal: Stable compression deformity of T12. The bones are diffusely osteopenic. IMPRESSION: 1. Again seen is a large hiatal hernia containing the proximal stomach. The intrathoracic portion of the stomach is mildly distended with air. There is some wall thickening and surrounding fluid involving the proximal stomach (incompletely imaged). Correlate clinically for gastritis. Surrounding free fluid may be related to recent surgery. Differential diagnosis would include perforation in the appropriate clinical setting. Electronically Signed   By: Darliss Cheney M.D.   On: 06/03/2021 20:12    Procedures Procedures    Medications Ordered in ED Medications  sodium chloride 0.9 % bolus 500 mL (0 mLs Intravenous Stopped 06/03/21 2017)  ondansetron (ZOFRAN) injection 4 mg (4 mg Intravenous Given 06/03/21 1825)  iohexol (OMNIPAQUE) 300 MG/ML solution 75 mL (75 mLs Intravenous Contrast Given 06/03/21 1949)    ED Course/ Medical Decision Making/ A&P                           Medical Decision Making Amount and/or Complexity of Data Reviewed Labs: ordered. Radiology: ordered.  Risk Prescription drug management. Decision regarding hospitalization.    Jolani Gindi is a 81 y.o. female with a past medical history significant for atrial fibrillation on Eliquis, CHF with pacemaker, hypertension, dyslipidemia, and previous large hiatal hernia with gastric  volvulus who recently had repair on 03/14/2021 who presents with recurrent nausea, upper abdominal discomfort, worsening fatigue, decreased appetite, and constipation.  According to patient, ever since her surgery she has been having worsening symptoms of feeling like food is getting stuck in her lower chest/upper abdomen.  She reports that it has been hurting worse and is currently a 4 out of 10 in severity.  It is sharp and cramping and she is having nausea.  She reports she is not able to eat much food at all because she is always nauseous but denies vomiting.  She reports has had more constipation but is still passing some gas.  She is concerned there could be a problem with her hernia again.  She reports no fevers, chills, chest pain, or shortness of breath.  Denies any urinary changes.  Denies trauma.  Denies worsened swelling anywhere.  Family is very concerned about her rapidly worsening decline.  On my exam, she does have tenderness in the epigastrium.  Bowel sounds were appreciated.  Chest was nontender.  Mild murmur appreciated.  Back nontender.  Lungs clear.  Patient has dry mucous membranes and did not have significant swelling on exam.  Clinically, given the patient's previous volvulus and repair with her worsened pain and feeling that food is getting stuck in her upper abdomen/lower chest, I do feel she needs repeat CT imaging.  We will get screening labs and give her some fluids as she does not appear critically fluid overloaded currently.  With her worsening fatigue, will get some screening blood work as well.  Anticipate reassessment after work-up to determine disposition.  7:36 PM Nursing just called to report that patient's potassium was found to be less than 2.  To confirm, we will redraw and get a repeat EKG.  Her EKG earlier did not show evidence of U waves but did have prolonged QTc.  We will start replating potassium via IV for the confirmation of the hypokalemia.  She will need  admission after work-up is completed.  Also, now that her QTc is found to be prolonged, will be hesitant to give QT prolonging medications such as the Zofran she arrived on arrival for the nausea.  8:37 PM CT scan returned showing persistent large hiatal hernia but no evidence of acute obstruction or volvulus again.  There was some fluid around it that was felt to be postsurgical.  I spoke to Dr. Dwain Sarna with general surgery who reviewed all the images and op reports and he does not think there is any obstruction now.  He will have his team come see the patient during the admission but we will call for medicine admission for the hypokalemia, fatigue, decreased appetite, and AKI with dehydration.  Repeat potassium is in process and once that has returned, will call for admission.  Patient to be potassium is much improved from the less than 2.0 that was found initially and now says 3.3.  She is already getting some potassium.     General surgery saw the patient and agrees with admission for failure to thrive.  They will discuss with the operating surgeon and discuss upper GI or other procedures may need to be done.  Medicine will be called for the fatigue, hypokalemia that is improving, decreased oral intake, and continued abdominal pain with her known hernia.  Medicine will be called for admission.         Final Clinical Impression(s) / ED Diagnoses Final diagnoses:  Hypokalemia  Nausea  Decreased oral intake  Epigastric pain     Clinical Impression: 1. Hypokalemia   2. Recent major surgery   3. Nausea   4. Decreased oral intake   5. Epigastric pain     Disposition: Admit  This note was prepared with assistance of Dragon voice recognition software. Occasional wrong-word or sound-a-like substitutions may have occurred due to the inherent limitations of voice recognition software.      Welton Bord, Canary Brim, MD 06/03/21 [phone]

## 2021-06-03 NOTE — Assessment & Plan Note (Addendum)
QT prolongation: ?Electrolyte replaced and corrected. ?Avoid QT prolonging medications. ? ?

## 2021-06-03 NOTE — Assessment & Plan Note (Addendum)
Likely prerenal from dehydration.  Baseline serum creatinine 0.5-0.6. ?Presented with creatinine 1.5 which is improved with IV hydration. ?Avoid nephrotoxic medications ? ?

## 2021-06-03 NOTE — Assessment & Plan Note (Addendum)
Improved. Continue to monitor. 

## 2021-06-03 NOTE — ED Notes (Signed)
Patient given water to take home meds with.  OK per MD Tegeler.  ?

## 2021-06-03 NOTE — Assessment & Plan Note (Signed)
Stable.  No signs of volume overload. ?-Monitor volume status closely ?

## 2021-06-03 NOTE — ED Notes (Signed)
Per MD Tegeler, patient only needs one run of potassium.  ?

## 2021-06-03 NOTE — Assessment & Plan Note (Addendum)
Failure to thrive: ?Patient with known large hiatal hernia, presented with nausea, upper abdominal discomfort, early satiety, bloating, weight loss. ?CT A/P : Large hiatal hernia containing proximal stomach.The intrathoracic portion of the stomach is mildly distended with air. There is some wall thickening and surrounding fluid involving the proximal stomach. ?Patient was evaluated by general surgery Dr. Cleophas Dunker.  He reports patient's symptoms are related to hiatal hernia but she does not have volvulus. ?General surgery recommended upper GI series which was unremarkable. ?Continue gentle IV fluid hydration.  Continue home PPI.   ?General surgery recommended to continue on liquid or soft diet, should always eat in upright position.  No further recommendation.  General surgery signed off. ?

## 2021-06-03 NOTE — Assessment & Plan Note (Addendum)
Continue Synthroid °

## 2021-06-03 NOTE — Assessment & Plan Note (Addendum)
Status postcholecystectomy.  No acute hepatobiliary abnormality on CT ?Liver enzymes slightly elevated now normalized. ? ? ?

## 2021-06-03 NOTE — Subjective & Objective (Addendum)
Patient is a 81 year old female with a past medical history of hypertension, hyperlipidemia, sick sinus syndrome status post pacemaker, paroxysmal A-fib, chronic diastolic CHF, hypothyroidism.  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  Patient was seen by GI and underwent endoscopic decompression on 02/13/2021.  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  Evaluated by general surgery and underwent robotic Xi takedown of the stomach and sac from the mediastinum with endoscopic PEG placement on 03/15/2021.  Return to the ED on 05/20/2021 for generalized weakness and refusal to eat.  CT revealed large hiatal hernia without recurring obstruction.  G-tube was removed at general surgery office several weeks ago. ? ?Patient returns to the ED today complaining of nausea, upper abdominal discomfort, poor appetite, and fatigue.  Vital signs stable.  Labs showing no leukocytosis.  Hemoglobin 11.0, stable.  Potassium <2.0.  Chloride 91.  Creatinine 1.5, baseline 0.5-0.6.  AST 57, ALT 46.  Alk phos and T. bili normal.  Lipase normal.  Magnesium 2.5.  BNP 106.  Lactic acid normal.  TSH 0.136.  COVID and influenza PCR negative.  UA with negative nitrite, trace leukocytes, and microscopy showing 6-10 WBCs and no bacteria.  Urine culture pending.  EKG showing QT prolongation.  Patient was given IV potassium 10 mg x 1, Zofran, and 500 cc fluid bolus.  Repeat labs showing potassium 3.3 but slight hemolysis noted.  Creatinine improved to 1.3.  AST 65, ALT 38.  Alk phos normal.  T. bili 1.7. ? ?CT abdomen pelvis showing: ?"IMPRESSION: ?1. Again seen is a large hiatal hernia containing the proximal ?stomach. The intrathoracic portion of the stomach is mildly ?distended with air. There is some wall thickening and  surrounding ?fluid involving the proximal stomach (incompletely imaged). ?Correlate clinically for gastritis. Surrounding free fluid may be ?related to recent surgery. Differential diagnosis would include ?perforation in the appropriate clinical setting." ? ?Dr. Donne Hazel from general surgery evaluated the patient and felt that her symptoms are related to hiatal hernia but she does not have volvulus.  He is considering UGI while here and will discuss the patient's case with her operating surgeon. ? ?Patient is a poor historian.  States she feels sick to her stomach and is not able to eat much.  Also endorsing slight upper abdominal discomfort.  Symptoms have been going on for the past 6 months.  No vomiting or diarrhea.  She is losing weight.  She is not able to quantify the amount of food she can eat at a time.  No other complaints.  Denies fevers, chills, cough, shortness of breath, or chest pain.  Denies dysuria or urinary frequency/urgency. ?

## 2021-06-03 NOTE — ED Triage Notes (Signed)
Patient complains of nausea and decreased appetite since her hernia surgery, states she can only do a spoonful of food at a time. No energy and general fatigue.   ?

## 2021-06-04 ENCOUNTER — Other Ambulatory Visit: Payer: Self-pay

## 2021-06-04 DIAGNOSIS — K449 Diaphragmatic hernia without obstruction or gangrene: Secondary | ICD-10-CM | POA: Diagnosis not present

## 2021-06-04 DIAGNOSIS — Z931 Gastrostomy status: Secondary | ICD-10-CM

## 2021-06-04 LAB — COMPREHENSIVE METABOLIC PANEL
ALT: 42 U/L (ref 0–44)
AST: 49 U/L — ABNORMAL HIGH (ref 15–41)
Albumin: 3.4 g/dL — ABNORMAL LOW (ref 3.5–5.0)
Alkaline Phosphatase: 82 U/L (ref 38–126)
Anion gap: 7 (ref 5–15)
BUN: 13 mg/dL (ref 8–23)
CO2: 32 mmol/L (ref 22–32)
Calcium: 8.4 mg/dL — ABNORMAL LOW (ref 8.9–10.3)
Chloride: 95 mmol/L — ABNORMAL LOW (ref 98–111)
Creatinine, Ser: 1.19 mg/dL — ABNORMAL HIGH (ref 0.44–1.00)
GFR, Estimated: 46 mL/min — ABNORMAL LOW (ref >60)
Glucose, Bld: 94 mg/dL (ref 70–99)
Potassium: 2.6 mmol/L — CL (ref 3.5–5.1)
Sodium: 134 mmol/L — ABNORMAL LOW (ref 135–145)
Total Bilirubin: 0.5 mg/dL (ref 0.3–1.2)
Total Protein: 6.2 g/dL — ABNORMAL LOW (ref 6.5–8.1)

## 2021-06-04 LAB — PHOSPHORUS: Phosphorus: 3.7 mg/dL (ref 2.5–4.6)

## 2021-06-04 LAB — PREALBUMIN: Prealbumin: 17.2 mg/dL — ABNORMAL LOW (ref 18–38)

## 2021-06-04 LAB — MAGNESIUM: Magnesium: 2.6 mg/dL — ABNORMAL HIGH (ref 1.7–2.4)

## 2021-06-04 LAB — POTASSIUM: Potassium: <2 mmol/L — CL (ref 3.5–5.1)

## 2021-06-04 MED ORDER — SIMETHICONE 80 MG PO CHEW
80.0000 mg | CHEWABLE_TABLET | Freq: Four times a day (QID) | ORAL | Status: DC
  Administered 2021-06-04 – 2021-06-06 (×8): 80 mg via ORAL
  Filled 2021-06-04 (×10): qty 1

## 2021-06-04 MED ORDER — PHENOL 1.4 % MT LIQD: 2.0000 | OROMUCOSAL | Status: DC | PRN

## 2021-06-04 MED ORDER — ONDANSETRON HCL 4 MG/2ML IJ SOLN: 4.0000 mg | Freq: Four times a day (QID) | INTRAMUSCULAR | Status: DC | PRN

## 2021-06-04 MED ORDER — LACTATED RINGERS IV BOLUS
1000.0000 mL | Freq: Three times a day (TID) | INTRAVENOUS | Status: AC | PRN
Start: 2021-06-04 — End: 2021-06-06

## 2021-06-04 MED ORDER — POTASSIUM CHLORIDE 20 MEQ PO PACK
40.0000 meq | PACK | ORAL | Status: AC
  Administered 2021-06-04 (×2): 40 meq via ORAL
  Filled 2021-06-04 (×2): qty 2

## 2021-06-04 MED ORDER — POTASSIUM CHLORIDE 10 MEQ/100ML IV SOLN
10.0000 meq | INTRAVENOUS | Status: AC
  Administered 2021-06-04 (×4): 10 meq via INTRAVENOUS
  Filled 2021-06-04 (×4): qty 100

## 2021-06-04 MED ORDER — DIPHENHYDRAMINE HCL 50 MG/ML IJ SOLN: 12.5000 mg | Freq: Four times a day (QID) | INTRAMUSCULAR | Status: DC | PRN

## 2021-06-04 MED ORDER — MAGIC MOUTHWASH
15.0000 mL | Freq: Four times a day (QID) | ORAL | Status: DC | PRN
  Filled 2021-06-04: qty 15

## 2021-06-04 MED ORDER — METOCLOPRAMIDE HCL 5 MG/ML IJ SOLN: 5.0000 mg | Freq: Three times a day (TID) | INTRAMUSCULAR | Status: DC | PRN

## 2021-06-04 MED ORDER — ACETAMINOPHEN 650 MG RE SUPP: 650.0000 mg | Freq: Four times a day (QID) | RECTAL | Status: DC | PRN

## 2021-06-04 MED ORDER — POTASSIUM CHLORIDE 10 MEQ/100ML IV SOLN
10.0000 meq | INTRAVENOUS | Status: AC
  Administered 2021-06-04 (×6): 10 meq via INTRAVENOUS
  Filled 2021-06-04 (×6): qty 100

## 2021-06-04 MED ORDER — PANTOPRAZOLE SODIUM 40 MG PO TBEC
40.0000 mg | DELAYED_RELEASE_TABLET | Freq: Every day | ORAL | Status: DC
  Administered 2021-06-04: 40 mg via ORAL
  Filled 2021-06-04: qty 1

## 2021-06-04 MED ORDER — BISACODYL 10 MG RE SUPP
10.0000 mg | Freq: Every day | RECTAL | Status: DC
Start: 2021-06-04 — End: 2021-06-06
  Filled 2021-06-04 (×2): qty 1

## 2021-06-04 MED ORDER — ALUM & MAG HYDROXIDE-SIMETH 200-200-20 MG/5ML PO SUSP
30.0000 mL | Freq: Four times a day (QID) | ORAL | Status: DC | PRN
Start: 2021-06-04 — End: 2021-06-06

## 2021-06-04 MED ORDER — LEVOTHYROXINE SODIUM 75 MCG PO TABS
75.0000 ug | ORAL_TABLET | Freq: Every day | ORAL | Status: DC
  Administered 2021-06-04 – 2021-06-06 (×3): 75 ug via ORAL
  Filled 2021-06-04 (×3): qty 1

## 2021-06-04 MED ORDER — PROCHLORPERAZINE EDISYLATE 10 MG/2ML IJ SOLN: 5.0000 mg | INTRAMUSCULAR | Status: DC | PRN

## 2021-06-04 MED ORDER — SODIUM CHLORIDE 0.9 % IV SOLN: INTRAVENOUS | Status: AC

## 2021-06-04 MED ORDER — LIP MEDEX EX OINT
1.0000 "application " | TOPICAL_OINTMENT | Freq: Two times a day (BID) | CUTANEOUS | Status: DC
  Administered 2021-06-04 – 2021-06-06 (×5): 1 via TOPICAL
  Filled 2021-06-04: qty 7

## 2021-06-04 MED ORDER — MENTHOL 3 MG MT LOZG
1.0000 | LOZENGE | OROMUCOSAL | Status: DC | PRN
Start: 2021-06-04 — End: 2021-06-06

## 2021-06-04 MED ORDER — SODIUM CHLORIDE 0.9 % IV SOLN
8.0000 mg | Freq: Four times a day (QID) | INTRAVENOUS | Status: DC | PRN
  Filled 2021-06-04: qty 4

## 2021-06-04 MED ORDER — ROSUVASTATIN CALCIUM 10 MG PO TABS
20.0000 mg | ORAL_TABLET | Freq: Every day | ORAL | Status: DC
  Administered 2021-06-04 – 2021-06-06 (×3): 20 mg via ORAL
  Filled 2021-06-04: qty 1
  Filled 2021-06-04 (×2): qty 2

## 2021-06-04 MED ORDER — POTASSIUM CHLORIDE 20 MEQ PO PACK
40.0000 meq | PACK | ORAL | Status: AC
  Administered 2021-06-04: 40 meq via ORAL
  Filled 2021-06-04: qty 2

## 2021-06-04 MED ORDER — AMIODARONE HCL 200 MG PO TABS
200.0000 mg | ORAL_TABLET | Freq: Every day | ORAL | Status: DC
  Administered 2021-06-04 – 2021-06-06 (×3): 200 mg via ORAL
  Filled 2021-06-04 (×4): qty 1

## 2021-06-04 MED ORDER — POTASSIUM CHLORIDE CRYS ER 20 MEQ PO TBCR
40.0000 meq | EXTENDED_RELEASE_TABLET | Freq: Once | ORAL | Status: AC
  Administered 2021-06-04: 40 meq via ORAL
  Filled 2021-06-04: qty 2

## 2021-06-04 MED ORDER — METHOCARBAMOL 1000 MG/10ML IJ SOLN
1000.0000 mg | Freq: Four times a day (QID) | INTRAVENOUS | Status: DC | PRN
  Filled 2021-06-04: qty 10

## 2021-06-04 NOTE — Assessment & Plan Note (Addendum)
Continue Crestor 

## 2021-06-04 NOTE — ED Notes (Signed)
Pt ambulatory to bathroom with stand by assist. Pt states that she does feel a little lightheaded upon getting out of bed.  ? ?Pt denies pain at this time, NAD. Respirations are even and non-labored  ? ?Call light within reach and warm blankets given. Side rails up on bed.  ?

## 2021-06-04 NOTE — ED Notes (Addendum)
Critical Lab Value 2.6 K+ called from lab. Provider notified  ?To order  KCL 72meq x 4 and KCL 12meq PO  ?

## 2021-06-04 NOTE — ED Notes (Signed)
Patient resting, respirations even and unlabored.  Side rails up x2, call bell within reach.  Patient in NAD at this time. ?

## 2021-06-04 NOTE — Assessment & Plan Note (Signed)
Status post PPM 

## 2021-06-04 NOTE — Care Plan (Addendum)
This 81 years old female with PMH significant for HTN, hyperlipidemia, sick sinus syndrome s/p PPM, paroxysmal A-fib, chronic diastolic CHF, hypothyroidism admitted with abdominal pain, decreased appetite, bloating.  CT abdomen which showed intractable hiatal hernia. Dr. Dwain Sarna from general surgery evaluated the patient and felt that her symptoms are related to hiatal hernia but she does not have volvulus.  General surgery recommended UGI while here and will discuss the patient's case with operating surgeon..  Patient was seen and examined at bedside she is lying comfortably.  Her potassium is low  which is being replaced.  Continue to monitor clinical status and follow-up surgical plan. ?

## 2021-06-04 NOTE — Plan of Care (Signed)
Pt pleasant and alert. ?Explained hospital procedures and next steps for care. ?

## 2021-06-04 NOTE — Plan of Care (Signed)

## 2021-06-04 NOTE — H&P (Signed)
History and Physical    Danielle Blanchard GUR:[id number] DOB: 04/23/1940 DOA: 06/03/2021  DOS: the patient was seen and examined on 06/03/2021  PCP: [facility] [place]   Patient coming from: Home  I have personally briefly reviewed patient's old medical records in [place]  Patient is a 81 year old female with a past medical history of hypertension, hyperlipidemia, sick sinus syndrome status post pacemaker, paroxysmal A-fib, chronic diastolic CHF, hypothyroidism.  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  Patient was seen by GI and underwent endoscopic decompression on 02/13/2021.  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  Evaluated by general surgery and underwent robotic Xi takedown of the stomach and sac from the mediastinum with endoscopic PEG placement on 03/15/2021.  Return to the ED on 05/20/2021 for generalized weakness and refusal to eat.  CT revealed large hiatal hernia without recurring obstruction.  G-tube was removed at general surgery office several weeks ago.  Patient returns to the ED today complaining of nausea, upper abdominal discomfort, poor appetite, and fatigue.  Vital signs stable.  Labs showing no leukocytosis.  Hemoglobin 11.0, stable.  Potassium <2.0.  Chloride 91.  Creatinine 1.5, baseline 0.5-0.6.  AST 57, ALT 46.  Alk phos and T. bili normal.  Lipase normal.  Magnesium 2.5.  BNP 106.  Lactic acid normal.  TSH 0.136.  COVID and influenza PCR negative.  UA with negative nitrite, trace leukocytes, and microscopy showing 6-10 WBCs and no bacteria.  Urine culture pending.  EKG showing QT prolongation.  Patient was given IV potassium 10 mg x 1, Zofran, and 500 cc fluid bolus.  Repeat labs showing potassium 3.3 but slight  hemolysis noted.  Creatinine improved to 1.3.  AST 65, ALT 38.  Alk phos normal.  T. bili 1.7.  CT abdomen pelvis showing: "IMPRESSION: 1. Again seen is a large hiatal hernia containing the proximal stomach. The intrathoracic portion of the stomach is mildly distended with air. There is some wall thickening and surrounding fluid involving the proximal stomach (incompletely imaged). Correlate clinically for gastritis. Surrounding free fluid may be related to recent surgery. Differential diagnosis would include perforation in the appropriate clinical setting."  Dr. Donne Hazel from general surgery evaluated the patient and felt that her symptoms are related to hiatal hernia but she does not have volvulus.  He is considering UGI while here and will discuss the patient's case with her operating surgeon.  Patient is a poor historian.  States she feels sick to her stomach and is not able to eat much.  Also endorsing slight upper abdominal discomfort.  Symptoms have been going on for the past 6 months.  No vomiting or diarrhea.  She is losing weight.  She is not able to quantify the amount of food she can eat at a time.  No other complaints.  Denies fevers, chills, cough, shortness of breath, or chest pain.  Denies dysuria or urinary frequency/urgency.   Review of Systems:  Review of Systems  All other systems reviewed and are negative.  Past Medical History:  Diagnosis Date   High cholesterol    Hypertension     Past Surgical History:  Procedure Laterality Date   ESOPHAGOGASTRODUODENOSCOPY (EGD) WITH PROPOFOL N/A 02/13/2021   Procedure: ESOPHAGOGASTRODUODENOSCOPY (EGD) WITH PROPOFOL;  Surgeon: Arta Silence, MD;  Location: [facility];  Service: Gastroenterology;  Laterality: N/A;   PACEMAKER PLACEMENT     PEG PLACEMENT N/A 03/14/2021   Procedure: PERCUTANEOUS ENDOSCOPIC GASTROSTOMY (PEG) PLACEMENT;  Surgeon: Johnathan Hausen, MD;  Location: [facility];  Service: General;  Laterality: N/A;   XI  ROBOTIC ASSISTED HIATAL HERNIA REPAIR N/A 03/14/2021   Procedure: XI ROBOTIC ASSISTED TAKEDOWN OF INCARCERATED HIATAL HERNIA;  Surgeon: Johnathan Hausen, MD;  Location: [facility];  Service: General;  Laterality: N/A;     reports that she has never smoked. She has never used smokeless tobacco. She reports that she does not drink alcohol and does not use drugs.  No Known Allergies  No family history on file.  Prior to Admission medications   Medication Sig Start Date End Date Taking? Authorizing Provider  acetaminophen (TYLENOL) 500 MG tablet Take 500 mg by mouth every 6 (six) hours as needed for moderate pain or headache.   Yes [provider]  amiodarone (PACERONE) 200 MG tablet Take 200 mg by mouth daily.   Yes [provider]  apixaban (ELIQUIS) 5 MG TABS tablet Take 5 mg by mouth 2 (two) times daily.   Yes [provider]  carvedilol (COREG) 3.125 MG tablet Take 3.125 mg by mouth 2 (two) times daily with a meal.   Yes [provider]  docusate sodium (COLACE) 100 MG capsule Take 1 capsule (100 mg total) by mouth 2 (two) times daily. Patient taking differently: Take 100 mg by mouth daily as needed for mild constipation. 03/21/21  Yes Regalado, Belkys A, MD  feeding supplement (ENSURE ENLIVE / ENSURE PLUS) LIQD Take 237 mLs by mouth 4 (four) times daily. Patient taking differently: Take 237 mLs by mouth daily. 03/21/21  Yes Regalado, Belkys A, MD  lactulose (CHRONULAC) 10 GM/15ML solution Take 30 mLs (20 g total) by mouth daily. Patient taking differently: Take 20 g by mouth daily as needed for moderate constipation. 03/22/21  Yes Regalado, Belkys A, MD  levothyroxine (SYNTHROID) 75 MCG tablet Take 75 mcg by mouth daily before breakfast.   Yes [provider]  lidocaine (LIDODERM) 5 % Place 1 patch onto the skin daily. Remove & Discard patch within 12 hours or as directed by MD 03/22/21  Yes Regalado, Belkys A, MD  Melatonin 10 MG TABS Take 10 mg by  mouth at bedtime.   Yes [provider]  pantoprazole (PROTONIX) 40 MG tablet Take 1 tablet (40 mg total) by mouth daily. Patient taking differently: Take 40 mg by mouth daily as needed (heartburn). 03/22/21  Yes Regalado, Belkys A, MD  Polyethyl Glycol-Propyl Glycol (SYSTANE) 0.4-0.3 % GEL ophthalmic gel Place 1 application into both eyes 3 (three) times daily as needed. Patient taking differently: Place 1 application into both eyes 3 (three) times daily as needed (for dry eyes). 02/15/20  Yes Yu, Amy V, PA-C  rosuvastatin (CRESTOR) 20 MG tablet Take 20 mg by mouth daily.   Yes [provider]  senna (SENOKOT) 8.6 MG TABS tablet Take 1 tablet (8.6 mg total) by mouth daily. Patient taking differently: Take 1 tablet by mouth daily as needed for moderate constipation. 03/22/21  Yes Regalado, Belkys A, MD  traMADol-acetaminophen (ULTRACET) 37.5-325 MG tablet Take 1 tablet by mouth every 6 (six) hours as needed for severe pain.   Yes [provider]  bisacodyl (DULCOLAX) 10 MG suppository Place 1 suppository (10 mg total) rectally daily as needed for moderate constipation. Patient not taking: Reported on 06/03/2021 03/21/21   Elmarie Shiley, MD  polyethylene  glycol (MIRALAX / GLYCOLAX) 17 g packet Take 17 g by mouth daily. Patient not taking: Reported on 06/03/2021 03/22/21   Elmarie Shiley, MD  Water For Irrigation, Sterile (FREE WATER) SOLN Place 30 mLs into feeding tube daily. Patient not taking: Reported on 06/03/2021 03/22/21   Elmarie Shiley, MD    Physical Exam: Vitals:   06/03/21 1756 06/03/21 1900 06/03/21 1930 06/03/21 2155  BP: 133/76 (!) 120/55 (!) 109/58 116/81  Pulse: 67 62 (!) 59 62  Resp: 16 (!) 24 19 15   Temp: 97.6 F (36.4 C)     TempSrc: Oral     SpO2: 99% 98% 97% 99%    Physical Exam Vitals and nursing note reviewed.  Constitutional:      General: She is not in acute distress. HENT:     Head: Normocephalic and atraumatic.  Eyes:      Extraocular Movements: Extraocular movements intact.     Conjunctiva/sclera: Conjunctivae normal.  Cardiovascular:     Rate and Rhythm: Normal rate and regular rhythm.     Pulses: Normal pulses.  Pulmonary:     Effort: Pulmonary effort is normal. No respiratory distress.     Breath sounds: Normal breath sounds. No wheezing or rales.  Abdominal:     General: Bowel sounds are normal. There is no distension.     Palpations: Abdomen is soft.     Tenderness: There is no abdominal tenderness. There is no guarding or rebound.  Musculoskeletal:        General: No swelling or tenderness.     Cervical back: Normal range of motion and neck supple.  Skin:    General: Skin is warm and dry.  Neurological:     General: No focal deficit present.     Mental Status: She is alert and oriented to person, place, and time.     Labs on Admission: I have personally reviewed following labs and imaging studies  CBC: Recent Labs  Lab 06/03/21 1816  WBC 5.9  NEUTROABS 3.7  HGB 11.0*  HCT 33.1*  MCV 90.7  PLT 403   Basic Metabolic Panel: Recent Labs  Lab 06/03/21 1816 06/03/21 2020 06/03/21 2335  NA 134* 132*  --   K <2.0* 3.3* <2.0*  CL 91* 92*  --   CO2 33* 32  --   GLUCOSE 101* 103*  --   BUN 19 18  --   CREATININE 1.55* 1.34*  --   CALCIUM 8.7* 8.0*  --   MG 2.5*  --   --    GFR: CrCl cannot be calculated (Unknown ideal weight.). Liver Function Tests: Recent Labs  Lab 06/03/21 1816 06/03/21 2020  AST 57* 65*  ALT 46* 38  ALKPHOS 96 85  BILITOT 0.8 1.7*  PROT 7.1 6.5  ALBUMIN 3.8 3.3*   Recent Labs  Lab 06/03/21 1816  LIPASE 38   No results for input(s): AMMONIA in the last 168 hours. Coagulation Profile: No results for input(s): INR, PROTIME in the last 168 hours. Cardiac Enzymes: No results for input(s): CKTOTAL, CKMB, CKMBINDEX, TROPONINI in the last 168 hours. BNP (last 3 results) No results for input(s): PROBNP in the last 8760 hours. HbA1C: No results for  input(s): HGBA1C in the last 72 hours. CBG: No results for input(s): GLUCAP in the last 168 hours. Lipid Profile: No results for input(s): CHOL, HDL, LDLCALC, TRIG, CHOLHDL, LDLDIRECT in the last 72 hours. Thyroid Function Tests: Recent Labs    06/03/21 1827  TSH 0.136*  Anemia Panel: No results for input(s): VITAMINB12, FOLATE, FERRITIN, TIBC, IRON, RETICCTPCT in the last 72 hours. Urine analysis:    Component Value Date/Time   COLORURINE STRAW (A) 06/03/2021 2212   APPEARANCEUR CLEAR 06/03/2021 2212   LABSPEC 1.025 06/03/2021 2212   PHURINE 5.0 06/03/2021 2212   GLUCOSEU NEGATIVE 06/03/2021 2212   HGBUR SMALL (A) 06/03/2021 2212   BILIRUBINUR NEGATIVE 06/03/2021 2212   KETONESUR NEGATIVE 06/03/2021 2212   PROTEINUR NEGATIVE 06/03/2021 2212   NITRITE NEGATIVE 06/03/2021 2212   LEUKOCYTESUR TRACE (A) 06/03/2021 2212    Radiological Exams on Admission: I have personally reviewed images CT ABDOMEN PELVIS W CONTRAST  Result Date: 06/03/2021 CLINICAL DATA:  Evaluate for bowel obstruction. Previous hiatal hernia volvulus status post surgery. EXAM: CT ABDOMEN AND PELVIS WITH CONTRAST TECHNIQUE: Multidetector CT imaging of the abdomen and pelvis was performed using the standard protocol following bolus administration of intravenous contrast. RADIATION DOSE REDUCTION: This exam was performed according to the departmental dose-optimization program which includes automated exposure control, adjustment of the mA and/or kV according to patient size and/or use of iterative reconstruction technique. CONTRAST:  56m OMNIPAQUE IOHEXOL 300 MG/ML  SOLN COMPARISON:  CT abdomen and pelvis 05/20/2021. FINDINGS: Lower chest: Chronic peripheral reticular opacities are unchanged in both lung bases. Hepatobiliary: No focal liver abnormality is seen. Status post cholecystectomy. No biliary dilatation. Pancreas: Unremarkable. No pancreatic ductal dilatation or surrounding inflammatory changes. Spleen: Normal in  size without focal abnormality. Adrenals/Urinary Tract: Adrenal glands are unremarkable. Kidneys are normal, without renal calculi, focal lesion, or hydronephrosis. Bladder is unremarkable. Stomach/Bowel: Stomach and small bowel are nondilated. There is sigmoid colon diverticulosis without evidence for acute diverticulitis. The appendix is within normal limits. Again seen is a large hiatal hernia containing mid and proximal stomach. The intrathoracic portion of the stomach is mildly distended with air. There is some wall thickening and surrounding fluid at the level of the proximal stomach incompletely imaged. Vascular/Lymphatic: Aortic atherosclerosis. No enlarged abdominal or pelvic lymph nodes. Reproductive: Uterus and bilateral adnexa are unremarkable. Other: No ascites or free air. Small fat containing inguinal hernias bilaterally. Musculoskeletal: Stable compression deformity of T12. The bones are diffusely osteopenic. IMPRESSION: 1. Again seen is a large hiatal hernia containing the proximal stomach. The intrathoracic portion of the stomach is mildly distended with air. There is some wall thickening and surrounding fluid involving the proximal stomach (incompletely imaged). Correlate clinically for gastritis. Surrounding free fluid may be related to recent surgery. Differential diagnosis would include perforation in the appropriate clinical setting. Electronically Signed   By: ARonney AstersM.D.   On: 06/03/2021 20:12    EKG: I have personally reviewed EKG: Paced rhythm.  QTc 682 on initial EKG and 675 on repeat EKG.  Assessment/Plan Principal Problem:   Large hiatal hernia Active Problems:   Hypokalemia   AKI (acute kidney injury) ([place]   Hypochloremia   Elevated liver enzymes   A-fib (HCC)   Sick sinus syndrome (HCC)   Hypothyroidism   Dyslipidemia   Chronic diastolic CHF (congestive heart failure) (HCC)    Assessment and Plan: * Large hiatal hernia Failure to thrive  Patient with  known large hiatal hernia (see HPI) presenting with complaints of nausea, upper abdominal discomfort, poor appetite, and weight loss.  CT abdomen pelvis showing: "IMPRESSION: 1. Again seen is a large hiatal hernia containing the proximal stomach. The intrathoracic portion of the stomach is mildly distended with air. There is some wall thickening and surrounding fluid involving the proximal  stomach (incompletely imaged). Correlate clinically for gastritis. Surrounding free fluid may be related to recent surgery. Differential diagnosis would include perforation in the appropriate clinical setting."   Dr. Donne Hazel from general surgery evaluated the patient and felt that her symptoms are related to hiatal hernia but she does not have volvulus.  He is considering UGI while here and will discuss the patient's case with her operating surgeon.  -Continue gentle IV fluid hydration.  Continue home PPI.  Patient is not vomiting, avoid QT prolonging antiemetics.  AKI (acute kidney injury) ([place]) Likely prerenal from dehydration. Creatinine 1.5, baseline 0.5-0.6.  Creatinine improved to 1.3 with IV fluids. -Continue IV fluid hydration.  Monitor renal function and urine output.  Avoid nephrotoxic agents.  Hypokalemia QT prolongation Potassium <2.0 on initial labs and QTc 682 on initial EKG.  Magnesium level is normal.  Patient was given IV potassium 10 mEq x1 and repeat labs showing potassium 3.3 with slight hemolysis noted.  Suspect lab error.  Potassium level repeated and as expected came back severely low at <2.0. -Continue to replace potassium aggressively.  Monitor labs closely and repeat EKG in a.m.  Elevated liver enzymes Liver enzymes slightly elevated.  Status postcholecystectomy, no acute hepatobiliary abnormality on CT. -Monitor LFTs  Hypochloremia -IV fluid hydration, continue to monitor  Chronic diastolic CHF (congestive heart failure) (HCC) Stable.  No signs of volume  overload. -Monitor volume status closely  Dyslipidemia -Continue Crestor  Hypothyroidism TSH low at 0.136.  Patient is currently taking Synthroid 75 mcg daily. -Continue current dose of home Synthroid.  Patient will need repeat outpatient thyroid function studies outside setting of acute illness so dose of Synthroid can be adjusted.  Sick sinus syndrome (HCC) Status post PPM  A-fib ([place]) Has a pacemaker. -Blood pressure currently soft.  Continue amiodarone and hold Coreg.  Hold Eliquis at this time and resume when okay with general surgery.   DVT prophylaxis:  Hold Eliquis at this time and resume if okay with general surgery. Code Status: Full Code.  Per records from prior hospitalization, patient is listed as DNR.  I discussed CODE STATUS with the patient and she wishes to revoke DNR status.  Patient wishes to be full code. Family Communication: No family available at this time. Admission status: Observation, Telemetry bed   Shela Leff, MD [facility] 06/04/2021, 12:21 AM

## 2021-06-04 NOTE — Assessment & Plan Note (Addendum)
Patient does have a pacemaker. ?Blood pressure is soft.  Continue amiodarone and hold Coreg.   ?Resumed Eliquis. ?

## 2021-06-05 ENCOUNTER — Observation Stay (HOSPITAL_COMMUNITY): Payer: Medicare Other

## 2021-06-05 DIAGNOSIS — K449 Diaphragmatic hernia without obstruction or gangrene: Secondary | ICD-10-CM | POA: Diagnosis not present

## 2021-06-05 LAB — CBC
HCT: 31 % — ABNORMAL LOW (ref 36.0–46.0)
Hemoglobin: 10.1 g/dL — ABNORMAL LOW (ref 12.0–15.0)
MCH: 30.3 pg (ref 26.0–34.0)
MCHC: 32.6 g/dL (ref 30.0–36.0)
MCV: 93.1 fL (ref 80.0–100.0)
Platelets: 160 10*3/uL (ref 150–400)
RBC: 3.33 MIL/uL — ABNORMAL LOW (ref 3.87–5.11)
RDW: 13.8 % (ref 11.5–15.5)
WBC: 4.6 10*3/uL (ref 4.0–10.5)
nRBC: 0 % (ref 0.0–0.2)

## 2021-06-05 LAB — URINE CULTURE

## 2021-06-05 LAB — COMPREHENSIVE METABOLIC PANEL
ALT: 42 U/L (ref 0–44)
AST: 50 U/L — ABNORMAL HIGH (ref 15–41)
Albumin: 3.1 g/dL — ABNORMAL LOW (ref 3.5–5.0)
Alkaline Phosphatase: 80 U/L (ref 38–126)
Anion gap: 4 — ABNORMAL LOW (ref 5–15)
BUN: 8 mg/dL (ref 8–23)
CO2: 31 mmol/L (ref 22–32)
Calcium: 9 mg/dL (ref 8.9–10.3)
Chloride: 102 mmol/L (ref 98–111)
Creatinine, Ser: 1.02 mg/dL — ABNORMAL HIGH (ref 0.44–1.00)
GFR, Estimated: 55 mL/min — ABNORMAL LOW (ref >60)
Glucose, Bld: 82 mg/dL (ref 70–99)
Potassium: 4.8 mmol/L (ref 3.5–5.1)
Sodium: 137 mmol/L (ref 135–145)
Total Bilirubin: 0.7 mg/dL (ref 0.3–1.2)
Total Protein: 5.9 g/dL — ABNORMAL LOW (ref 6.5–8.1)

## 2021-06-05 LAB — MAGNESIUM: Magnesium: 2.3 mg/dL (ref 1.7–2.4)

## 2021-06-05 NOTE — Progress Notes (Signed)
?Progress Note ? ? ?PatientAnahi Blanchard QQV:[id number] DOB: 10/21/1940 DOA: 06/03/2021     0 ?DOS: the patient was seen and examined on 06/05/2021 ?  ?Brief hospital course: ?This 81 years old female with PMH significant for HTN, hyperlipidemia, sick sinus syndrome s/p PPM, paroxysmal A-fib, chronic diastolic CHF, hypothyroidism admitted with abdominal pain, decreased appetite, bloating.  CT abdomen which showed intractable hiatal hernia. Dr. Dwain Sarna from general surgery evaluated the patient and felt that her symptoms are related to hiatal hernia but she does not have volvulus.  General surgery recommended UGI while inpatient.  Patient was also found to have hypokalemia and other electrolyte abnormalities which were replaced and now corrected. ? ? ?Assessment and Plan: ?* Incarcerated hiatal hernia ?Failure to thrive  ?Patient with known large hiatal hernia, presented with nausea, upper abdominal discomfort, early satiety, bloating, weight loss. ?CT A/P : Large hiatal hernia containing proximal stomach.The intrathoracic portion of the stomach is mildly ?distended with air. There is some wall thickening and surrounding fluid involving the proximal stomach. ?Patient was evaluated by general surgery Dr. Cleophas Dunker.  He reports patient's symptoms are related to hiatal hernia but she does not have volvulus. ?General surgery recommended upper GI series. ?Continue gentle IV fluid hydration.  Continue home PPI.   ?Patient denies nausea vomiting.  She feels full immediately  after eating. ? ?AKI (acute kidney injury) (HCC) ?Likely prerenal from dehydration.  Baseline serum creatinine 0.5-0.6. ?Presented with creatinine 1.5 which is improved with IV hydration. ?Avoid nephrotoxic medications ? ? ?Hypokalemia ?QT prolongation: ?Electrolyte replaced and corrected. ?Avoid QT prolonging medications. ?Repeat EKG. ? ?Elevated liver enzymes ?Status postcholecystectomy.  No acute hepatobiliary abnormality on CT ?Liver enzymes slightly  elevated now normalized. ? ? ? ?Hypochloremia ?Improved.  Continue to monitor ? ?Chronic diastolic CHF (congestive heart failure) (HCC) ?Stable.  No signs of volume overload. ?-Monitor volume status closely ? ?Dyslipidemia ?Continue Crestor ? ?Hypothyroidism ?Continue Synthroid. ? ?Sick sinus syndrome (HCC) ?Status post PPM ? ?A-fib (HCC) ?Patient does have a pacemaker. ?Blood pressure is soft.  Continue amiodarone and hold Coreg.   ?Eliquis is at hold in anticipation for surgery if needed. ? ? ?Subjective: Patient was seen and examined at bedside.  Overnight events noted. ?Patient reports feeling better but denies any pain and states she feels full immediately after she eats. ?She is scheduled to have upper GI series today. ? ?Physical Exam: ?Vitals:  ? 06/04/21 1934 06/04/21 2240 06/05/21 0445 06/05/21 1333  ?BP: (!) 117/55 (!) 110/50 (!) 119/56 (!) 123/59  ?Pulse: 67 61 60 63  ?Resp: 16 16 12 16   ?Temp: 98 ?F (36.7 ?C) 98.2 ?F (36.8 ?C) 98 ?F (36.7 ?C) 97.8 ?F (36.6 ?C)  ?TempSrc: Oral Oral Oral   ?SpO2: 94% 96% 98% 100%  ?Weight:      ?Height:      ? ?General exam: Appears comfortable, not in any acute distress. ?Respiratory : Clear to auscultation bilaterally, normal respiratory effort. ?Cardiovascular : S1-S2 heard, regular rate and rhythm, no murmur. ?Gastrointestinal : Abdomen is soft, non tender, non distended, BS+ ?Central nervous system: Alert and oriented x3, no focal neurological deficits. ?Extremities: No edema, no cyanosis, no clubbing. ?Psychiatry: Mood, insight, judgment normal. ? ?Data Reviewed: ?I have Reviewed nursing notes, Vitals, and Lab results since pt's last encounter. Pertinent lab results CBC, BMP ?I have ordered test including CBC, BMP ?I have reviewed the last note from general surgery,  ?I have discussed pt's care plan and test results with patient.  ? ?  Family Communication: No family at bedside ? ?Disposition: ?Status is: Observation ?The patient remains OBS appropriate and will d/c  before 2 midnights. ?  ?Admitted for intractable hiatal hernia abdominal bloating, early satiety awaiting upper GI series. ? ? ?Planned Discharge Destination: Home ? ? ? ?Time spent: 50 minutes ? ?Author: ?Cipriano Bunker, MD ?06/05/2021 1:40 PM ? ?For on call review [email or web address].  ?

## 2021-06-05 NOTE — Progress Notes (Signed)
?   06/05/21 1400  ?Mobility  ?Activity Ambulated with assistance in hallway  ?Level of Assistance Standby assist, set-up cues, supervision of patient - no hands on  ?Assistive Device [place]  ?Distance Ambulated (ft) 200 ft  ?Activity Response Tolerated well  ?$Mobility charge 1 Mobility  ? ?Pt agreeable to mobilize this afternoon. Ambulated about 283ft in hall with cane, tolerated well. Noted generalize weakness, but believes this is secondary to NPO diet. Left pt in bed, call bell at side. RN/NT notified of session.  ? ?[facility] [employer]. ?Mobility Specialist ?Acute Rehab Services ?Office: [phone] ? ? ?

## 2021-06-05 NOTE — Progress Notes (Addendum)
Upper GI study ordered, NPO order not placed for this AM.  ?Pt received clear liquid breakfast.  ?Spoke with Victorino Dike in Radiology, will plan for study to be completed this afternoon.  ?Strict NPO order placed at this time.  ?

## 2021-06-05 NOTE — Progress Notes (Signed)
? ? ?   ?Subjective: ?Patient with no complaints this morning except continued early satiety and feeling full.  G-tube removed about 3 weeks ago at family's request per Dr. Daphine Deutscher.  Patient doesn't think she ever used her g-tube.  Denies any pain. ? ?ROS: See above, otherwise other systems negative ? ?Objective: ?Vital signs in last 24 hours: ?Temp:  [98 ?F (36.7 ?C)-98.3 ?F (36.8 ?C)] 98 ?F (36.7 ?C) (03/06 0445) ?Pulse Rate:  [59-67] 60 (03/06 0445) ?Resp:  [12-20] 12 (03/06 0445) ?BP: (99-130)/(50-75) 119/56 (03/06 0445) ?SpO2:  [94 %-99 %] 98 % (03/06 0445) ?Weight:  [64.6 kg] 64.6 kg (03/05 1244) ?Last BM Date : 06/04/21 ? ?Intake/Output from previous day: ?03/05 0701 - 03/06 0700 ?In: 338 [P.O.:238; IV Piggyback:100] ?Out: 150 [Urine:150] ?Intake/Output this shift: ?No intake/output data recorded. ? ?PE: ?Abd: soft, NT, ND, +BS ? ?Lab Results:  ?Recent Labs  ?  06/03/21 ?1816 06/05/21 ?0444  ?WBC 5.9 4.6  ?HGB 11.0* 10.1*  ?HCT 33.1* 31.0*  ?PLT 187 160  ? ?BMET ?Recent Labs  ?  06/04/21 ?0754 06/05/21 ?0444  ?NA 134* 137  ?K 2.6* 4.8  ?CL 95* 102  ?CO2 32 31  ?GLUCOSE 94 82  ?BUN 13 8  ?CREATININE 1.19* 1.02*  ?CALCIUM 8.4* 9.0  ? ?PT/INR ?No results for input(s): LABPROT, INR in the last 72 hours. ?CMP  ?   ?Component Value Date/Time  ? NA 137 06/05/2021 0444  ? K 4.8 06/05/2021 0444  ? CL 102 06/05/2021 0444  ? CO2 31 06/05/2021 0444  ? GLUCOSE 82 06/05/2021 0444  ? BUN 8 06/05/2021 0444  ? CREATININE 1.02 (H) 06/05/2021 0444  ? CALCIUM 9.0 06/05/2021 0444  ? PROT 5.9 (L) 06/05/2021 0444  ? ALBUMIN 3.1 (L) 06/05/2021 0444  ? AST 50 (H) 06/05/2021 0444  ? ALT 42 06/05/2021 0444  ? ALKPHOS 80 06/05/2021 0444  ? BILITOT 0.7 06/05/2021 0444  ? GFRNONAA 55 (L) 06/05/2021 0444  ? GFRAA >60 12/05/2019 2254  ? ?Lipase  ?   ?Component Value Date/Time  ? LIPASE 38 06/03/2021 1816  ? ? ? ? ? ?Studies/Results: ?CT ABDOMEN PELVIS W CONTRAST ? ?Result Date: 06/03/2021 ?CLINICAL DATA:  Evaluate for bowel obstruction.  Previous hiatal hernia volvulus status post surgery. EXAM: CT ABDOMEN AND PELVIS WITH CONTRAST TECHNIQUE: Multidetector CT imaging of the abdomen and pelvis was performed using the standard protocol following bolus administration of intravenous contrast. RADIATION DOSE REDUCTION: This exam was performed according to the departmental dose-optimization program which includes automated exposure control, adjustment of the mA and/or kV according to patient size and/or use of iterative reconstruction technique. CONTRAST:  3mL OMNIPAQUE IOHEXOL 300 MG/ML  SOLN COMPARISON:  CT abdomen and pelvis 05/20/2021. FINDINGS: Lower chest: Chronic peripheral reticular opacities are unchanged in both lung bases. Hepatobiliary: No focal liver abnormality is seen. Status post cholecystectomy. No biliary dilatation. Pancreas: Unremarkable. No pancreatic ductal dilatation or surrounding inflammatory changes. Spleen: Normal in size without focal abnormality. Adrenals/Urinary Tract: Adrenal glands are unremarkable. Kidneys are normal, without renal calculi, focal lesion, or hydronephrosis. Bladder is unremarkable. Stomach/Bowel: Stomach and small bowel are nondilated. There is sigmoid colon diverticulosis without evidence for acute diverticulitis. The appendix is within normal limits. Again seen is a large hiatal hernia containing mid and proximal stomach. The intrathoracic portion of the stomach is mildly distended with air. There is some wall thickening and surrounding fluid at the level of the proximal stomach incompletely imaged. Vascular/Lymphatic: Aortic atherosclerosis. No enlarged abdominal  or pelvic lymph nodes. Reproductive: Uterus and bilateral adnexa are unremarkable. Other: No ascites or free air. Small fat containing inguinal hernias bilaterally. Musculoskeletal: Stable compression deformity of T12. The bones are diffusely osteopenic. IMPRESSION: 1. Again seen is a large hiatal hernia containing the proximal stomach. The  intrathoracic portion of the stomach is mildly distended with air. There is some wall thickening and surrounding fluid involving the proximal stomach (incompletely imaged). Correlate clinically for gastritis. Surrounding free fluid may be related to recent surgery. Differential diagnosis would include perforation in the appropriate clinical setting. Electronically Signed   By: Darliss Cheney M.D.   On: 06/03/2021 20:12   ? ?Anti-infectives: ?Anti-infectives (From admission, onward)  ? ? None  ? ?  ? ? ? ?Assessment/Plan ?FTT s/p reduction of hiatal hernia with g-tube placement by Dr. Daphine Deutscher 03/14/21 ?-unable to repair hiatal hernia so this remains.  No evidence of volvulus ?-g-tube removed and biggest issue seems to be when she eats she feels full immediately ?-d/w Dr. Daphine Deutscher this morning.  Will await UGI results for further recommendations. ? ?FEN - CLD ?VTE - ok for chemical prophylaxis ?ID - none needed ? ?Straightforward Medical Decision Making ?Last 24 hrs of labs, vitals, I/Os, progress notes, imaging reviewed ? LOS: 0 days  ? ? ?Letha Cape , PA-C ?[facility] ?06/05/2021, 8:26 AM ?Please see Amion for pager number during day hours 7:00am-4:30pm or 7:00am -11:30am on weekends ? ?

## 2021-06-05 NOTE — Hospital Course (Addendum)
This 81 years old female with PMH significant for HTN, hyperlipidemia, sick sinus syndrome s/p PPM, paroxysmal A-fib, chronic diastolic CHF, hypothyroidism admitted with abdominal pain, decreased appetite, bloating.  CT abdomen which showed intractable hiatal hernia. Dr. Dwain Sarna from general surgery evaluated the patient and felt that her symptoms are related to hiatal hernia but she does not have volvulus.  General surgery recommended UGI while inpatient.  Patient was also found to have hypokalemia and other electrolyte abnormalities which were replaced and now corrected.  Upper GI series unremarkable.  General surgery recommended to remain on liquid diet, always eat in upright position.  No surgical intervention.  Patient feels better and want to be discharged.  Plan of care explained to the son in detail.  Patient is being discharged home. ? ?

## 2021-06-05 NOTE — Plan of Care (Signed)
Pt aox4, periods of forgetfulness and confusion.  ?NPO for UGI study this afternoon.  ?Surgery service following.  ? ?Problem: Education: ?Goal: Knowledge of General Education information will improve ?Description: Including pain rating scale, medication(s)/side effects and non-pharmacologic comfort measures ?Outcome: Progressing ?  ?Problem: Health Behavior/Discharge Planning: ?Goal: Ability to manage health-related needs will improve ?Outcome: Progressing ?  ?Problem: Clinical Measurements: ?Goal: Ability to maintain clinical measurements within normal limits will improve ?Outcome: Progressing ?Goal: Will remain free from infection ?Outcome: Progressing ?Goal: Diagnostic test results will improve ?Outcome: Progressing ?Goal: Respiratory complications will improve ?Outcome: Progressing ?Goal: Cardiovascular complication will be avoided ?Outcome: Progressing ?  ?Problem: Activity: ?Goal: Risk for activity intolerance will decrease ?Outcome: Progressing ?  ?Problem: Nutrition: ?Goal: Adequate nutrition will be maintained ?Outcome: Progressing ?  ?Problem: Coping: ?Goal: Level of anxiety will decrease ?Outcome: Progressing ?  ?Problem: Elimination: ?Goal: Will not experience complications related to bowel motility ?Outcome: Progressing ?Goal: Will not experience complications related to urinary retention ?Outcome: Progressing ?  ?Problem: Pain Managment: ?Goal: General experience of comfort will improve ?Outcome: Progressing ?  ?Problem: Safety: ?Goal: Ability to remain free from injury will improve ?Outcome: Progressing ?  ?Problem: Skin Integrity: ?Goal: Risk for impaired skin integrity will decrease ?Outcome: Progressing ?  ?

## 2021-06-06 DIAGNOSIS — K440 Diaphragmatic hernia with obstruction, without gangrene: Secondary | ICD-10-CM | POA: Diagnosis present

## 2021-06-06 DIAGNOSIS — Z79899 Other long term (current) drug therapy: Secondary | ICD-10-CM | POA: Diagnosis not present

## 2021-06-06 DIAGNOSIS — E7800 Pure hypercholesterolemia, unspecified: Secondary | ICD-10-CM | POA: Diagnosis present

## 2021-06-06 DIAGNOSIS — I110 Hypertensive heart disease with heart failure: Secondary | ICD-10-CM | POA: Diagnosis present

## 2021-06-06 DIAGNOSIS — D509 Iron deficiency anemia, unspecified: Secondary | ICD-10-CM | POA: Diagnosis present

## 2021-06-06 DIAGNOSIS — G4700 Insomnia, unspecified: Secondary | ICD-10-CM | POA: Diagnosis present

## 2021-06-06 DIAGNOSIS — E878 Other disorders of electrolyte and fluid balance, not elsewhere classified: Secondary | ICD-10-CM | POA: Diagnosis present

## 2021-06-06 DIAGNOSIS — R627 Adult failure to thrive: Secondary | ICD-10-CM | POA: Diagnosis present

## 2021-06-06 DIAGNOSIS — R748 Abnormal levels of other serum enzymes: Secondary | ICD-10-CM | POA: Diagnosis present

## 2021-06-06 DIAGNOSIS — Z7901 Long term (current) use of anticoagulants: Secondary | ICD-10-CM | POA: Diagnosis not present

## 2021-06-06 DIAGNOSIS — E860 Dehydration: Secondary | ICD-10-CM | POA: Diagnosis present

## 2021-06-06 DIAGNOSIS — Z79890 Hormone replacement therapy (postmenopausal): Secondary | ICD-10-CM | POA: Diagnosis not present

## 2021-06-06 DIAGNOSIS — E876 Hypokalemia: Secondary | ICD-10-CM | POA: Diagnosis present

## 2021-06-06 DIAGNOSIS — N179 Acute kidney failure, unspecified: Secondary | ICD-10-CM | POA: Diagnosis present

## 2021-06-06 DIAGNOSIS — I480 Paroxysmal atrial fibrillation: Secondary | ICD-10-CM | POA: Diagnosis present

## 2021-06-06 DIAGNOSIS — E039 Hypothyroidism, unspecified: Secondary | ICD-10-CM | POA: Diagnosis present

## 2021-06-06 DIAGNOSIS — I495 Sick sinus syndrome: Secondary | ICD-10-CM | POA: Diagnosis present

## 2021-06-06 DIAGNOSIS — Z950 Presence of cardiac pacemaker: Secondary | ICD-10-CM | POA: Diagnosis not present

## 2021-06-06 DIAGNOSIS — K449 Diaphragmatic hernia without obstruction or gangrene: Secondary | ICD-10-CM | POA: Diagnosis not present

## 2021-06-06 DIAGNOSIS — Z20822 Contact with and (suspected) exposure to covid-19: Secondary | ICD-10-CM | POA: Diagnosis present

## 2021-06-06 DIAGNOSIS — I5032 Chronic diastolic (congestive) heart failure: Secondary | ICD-10-CM | POA: Diagnosis present

## 2021-06-06 LAB — BASIC METABOLIC PANEL
Anion gap: 6 (ref 5–15)
BUN: 6 mg/dL — ABNORMAL LOW (ref 8–23)
CO2: 30 mmol/L (ref 22–32)
Calcium: 9 mg/dL (ref 8.9–10.3)
Chloride: 102 mmol/L (ref 98–111)
Creatinine, Ser: 1.05 mg/dL — ABNORMAL HIGH (ref 0.44–1.00)
GFR, Estimated: 53 mL/min — ABNORMAL LOW (ref >60)
Glucose, Bld: 85 mg/dL (ref 70–99)
Potassium: 4.2 mmol/L (ref 3.5–5.1)
Sodium: 138 mmol/L (ref 135–145)

## 2021-06-06 LAB — CBC
HCT: 32.6 % — ABNORMAL LOW (ref 36.0–46.0)
Hemoglobin: 10.6 g/dL — ABNORMAL LOW (ref 12.0–15.0)
MCH: 29.9 pg (ref 26.0–34.0)
MCHC: 32.5 g/dL (ref 30.0–36.0)
MCV: 92.1 fL (ref 80.0–100.0)
Platelets: 170 10*3/uL (ref 150–400)
RBC: 3.54 MIL/uL — ABNORMAL LOW (ref 3.87–5.11)
RDW: 13.5 % (ref 11.5–15.5)
WBC: 4.8 10*3/uL (ref 4.0–10.5)
nRBC: 0 % (ref 0.0–0.2)

## 2021-06-06 MED ORDER — ENSURE SURGERY PO LIQD
237.0000 mL | Freq: Three times a day (TID) | ORAL | Status: DC
  Administered 2021-06-06 (×2): 237 mL via ORAL
  Filled 2021-06-06 (×3): qty 237

## 2021-06-06 MED ORDER — APIXABAN 5 MG PO TABS
5.0000 mg | ORAL_TABLET | Freq: Two times a day (BID) | ORAL | Status: DC
  Administered 2021-06-06: 5 mg via ORAL
  Filled 2021-06-06: qty 1

## 2021-06-06 NOTE — Plan of Care (Signed)
Pt aox4, periods of forgetfulness and confusion.  ?Surgery service following, diet upgraded to full liquids.  ? ?Problem: Education: ?Goal: Knowledge of General Education information will improve ?Description: Including pain rating scale, medication(s)/side effects and non-pharmacologic comfort measures ?Outcome: Progressing ?  ?Problem: Health Behavior/Discharge Planning: ?Goal: Ability to manage health-related needs will improve ?Outcome: Progressing ?  ?Problem: Clinical Measurements: ?Goal: Ability to maintain clinical measurements within normal limits will improve ?Outcome: Progressing ?Goal: Will remain free from infection ?Outcome: Progressing ?Goal: Diagnostic test results will improve ?Outcome: Progressing ?Goal: Respiratory complications will improve ?Outcome: Progressing ?Goal: Cardiovascular complication will be avoided ?Outcome: Progressing ?  ?Problem: Activity: ?Goal: Risk for activity intolerance will decrease ?Outcome: Progressing ?  ?Problem: Nutrition: ?Goal: Adequate nutrition will be maintained ?Outcome: Progressing ?  ?Problem: Coping: ?Goal: Level of anxiety will decrease ?Outcome: Progressing ?  ?Problem: Elimination: ?Goal: Will not experience complications related to bowel motility ?Outcome: Progressing ?Goal: Will not experience complications related to urinary retention ?Outcome: Progressing ?  ?Problem: Pain Managment: ?Goal: General experience of comfort will improve ?Outcome: Progressing ?  ?Problem: Safety: ?Goal: Ability to remain free from injury will improve ?Outcome: Progressing ?  ?Problem: Skin Integrity: ?Goal: Risk for impaired skin integrity will decrease ?Outcome: Progressing ?  ?

## 2021-06-06 NOTE — Progress Notes (Signed)
Patient ID: Marizela Holiman, female   DOB: 10/29/1940, 81 y.o.   MRN: [id number] ?[facility] ?Progress Note ? ?   ?Subjective: ?CC-  ?Tired otherwise no complaints this morning. States that she gets full quick but tolerated the clear liquids yesterday. Denies n/v. BM yesterday. ?UGI yesterday showed no obstruction ? ?Objective: ?Vital signs in last 24 hours: ?Temp:  [97.4 ?F (36.3 ?C)-98.3 ?F (36.8 ?C)] 98.3 ?F (36.8 ?C) (03/07 0403) ?Pulse Rate:  [59-63] 59 (03/07 0403) ?Resp:  [16-18] 16 (03/07 0403) ?BP: (111-123)/(51-59) 111/51 (03/07 0403) ?SpO2:  [99 %-100 %] 99 % (03/07 0403) ?Last BM Date : 06/04/21 ? ?Intake/Output from previous day: ?No intake/output data recorded. ?Intake/Output this shift: ?No intake/output data recorded. ? ?PE: ?Gen:  Alert, NAD, pleasant ?Abd: soft, NT, ND, +BS ? ?Lab Results:  ?Recent Labs  ?  06/05/21 ?[id number] 06/06/21 ?[id number]  ?WBC 4.6 4.8  ?HGB 10.1* 10.6*  ?HCT 31.0* 32.6*  ?PLT 160 170  ? ?BMET ?Recent Labs  ?  06/05/21 ?0444 06/06/21 ?[id number]  ?NA 137 138  ?K 4.8 4.2  ?CL 102 102  ?CO2 31 30  ?GLUCOSE 82 85  ?BUN 8 6*  ?CREATININE 1.02* 1.05*  ?CALCIUM 9.0 9.0  ? ?PT/INR ?No results for input(s): LABPROT, INR in the last 72 hours. ?CMP  ?   ?Component Value Date/Time  ? NA 138 06/06/2021 0538  ? K 4.2 06/06/2021 0538  ? CL 102 06/06/2021 0538  ? CO2 30 06/06/2021 0538  ? GLUCOSE 85 06/06/2021 0538  ? BUN 6 (L) 06/06/2021 0538  ? CREATININE 1.05 (H) 06/06/2021 0538  ? CALCIUM 9.0 06/06/2021 0538  ? PROT 5.9 (L) 06/05/2021 0444  ? ALBUMIN 3.1 (L) 06/05/2021 0444  ? AST 50 (H) 06/05/2021 0444  ? ALT 42 06/05/2021 0444  ? ALKPHOS 80 06/05/2021 0444  ? BILITOT 0.7 06/05/2021 0444  ? GFRNONAA 53 (L) 06/06/2021 0538  ? GFRAA >60 12/05/2019 2254  ? ?Lipase  ?   ?Component Value Date/Time  ? LIPASE 38 06/03/2021 1816  ? ? ? ? ? ?Studies/Results: ?DG UGI W SINGLE CM (SOL OR THIN BA) ? ?Result Date: 06/05/2021 ?CLINICAL DATA:  Inpatient, known hiatal hernia, concern for  incarceration/volvulus, early satiety, nausea, recent removal of G-tube. EXAM: DG UGI W SINGLE CM TECHNIQUE: Scout radiograph was obtained. Limited single contrast examination was performed using thin liquid barium. Exam limited by patient clinical status and mobility. This exam was performed by Pasty Spillers, PA-C, and was supervised and interpreted by Macy Mis, MD. FLUOROSCOPY TIME:  Radiation Exposure Index (as provided by the fluoroscopic device): 1.80mG y COMPARISON:  CT A/P 06/03/21. FINDINGS: Scout Radiograph: Normal bowel gas pattern. Hiatal hernia not well appreciated on this supine scout film as compared to prior plain films Esophagus: Mildly patulous. Esophageal motility: Tertiary contractions of the distal esophagus. Gastroesophageal reflux:  None visualized. Ingested 25mm barium tablet:  Not given Stomach: J-shaped stomach. Moderate size hernia with sliding and paraesophageal components. There is organoaxial rotation of the intrathoracic portion. There is delayed emptying of contrast through the diaphragmatic hiatus when in a supine position. Contrast passes into the intra-abdominal portion of the stomach without substantial delay when patient is in a 45 degree recumbent position. Gastric emptying: Normal. Duodenum: Limited evaluation in this study. No obvious stricture or mass. Other:  None. IMPRESSION: Technically limited. Moderate size mixed type hiatal hernia. Organoaxial rotation of the intrathoracic portion without obstruction. Contrast passage is delayed through the diaphragmatic hiatus when supine but there is  no substantial delay when semi-recumbent. Electronically Signed   By: Macy Mis M.D.   On: 06/05/2021 15:38   ? ?Anti-infectives: ?Anti-infectives (From admission, onward)  ? ? None  ? ?  ? ? ? ?Assessment/Plan ?FTT s/p reduction of hiatal hernia with g-tube placement by Dr. Hassell Done 03/14/21 ?-unable to repair hiatal hernia so this remains.  No evidence of volvulus. No obstruction  on UGI 3/6 ?-g-tube removed per patient request and biggest issue seems to be when she eats she feels full immediately ?-D/w Dr. Hassell Done this morning. Recommend continuing FLD/ purees indefinitely. Smaller more frequent meals. She needs to sit up straight or stand when taking PO. No plans for any surgical intervention. Ok for discharge when tolerating this.  ?  ?FEN - FLD, Ensure ?VTE - ok for chemical prophylaxis ?ID - none needed ? ?I reviewed last 24 h vitals and pain scores, last 48 h intake and output, last 24 h labs and trends, and last 24 h imaging results. ? ?This care required low level of medical decision making.  ? ? ? LOS: 0 days  ? ? ?Wellington Hampshire, PA-C ?[facility] ?06/06/2021, 9:00 AM ?Please see Amion for pager number during day hours 7:00am-4:30pm ? ?

## 2021-06-06 NOTE — Progress Notes (Signed)
?   06/06/21 1100  ?Mobility  ?Activity Ambulated with assistance in hallway  ?Level of Assistance Contact guard assist, steadying assist  ?Assistive Device Cane  ?Distance Ambulated (ft) 120 ft  ?Activity Response Tolerated well  ?$Mobility charge 1 Mobility  ? ?Pt agreeable to mobilize this morning. Ambulated about 156ft in hall with cane, tolerated well. No complaints. Left pt in bed, call bell at side. RN/NT notified of session.  ? ? ?Rudell Cobb ?Mobility Specialist ?Acute Rehab Services ?Office: [phone] ? ?

## 2021-06-06 NOTE — Progress Notes (Signed)
?Progress Note ? ? ?PatientMarquetta Blanchard [id number] DOB: 06-19-1940 DOA: 06/03/2021     0 ?DOS: the patient was seen and examined on 06/06/2021 ?  ?Brief hospital course: ?This 81 years old female with PMH significant for HTN, hyperlipidemia, sick sinus syndrome s/p PPM, paroxysmal A-fib, chronic diastolic CHF, hypothyroidism admitted with abdominal pain, decreased appetite, bloating.  CT abdomen which showed intractable hiatal hernia. Dr. Donne Hazel from general surgery evaluated the patient and felt that her symptoms are related to hiatal hernia but she does not have volvulus.  General surgery recommended UGI while inpatient.  Patient was also found to have hypokalemia and other electrolyte abnormalities which were replaced and now corrected.  Upper GI series unremarkable.  General surgery recommended to remain on liquid diet, always eat in upright position.  No surgical intervention. ? ? ?Assessment and Plan: ?* Incarcerated hiatal hernia ?Failure to thrive: ?Patient with known large hiatal hernia, presented with nausea, upper abdominal discomfort, early satiety, bloating, weight loss. ?CT A/P : Large hiatal hernia containing proximal stomach.The intrathoracic portion of the stomach is mildly distended with air. There is some wall thickening and surrounding fluid involving the proximal stomach. ?Patient was evaluated by general surgery Dr. Durward Fortes.  He reports patient's symptoms are related to hiatal hernia but she does not have volvulus. ?General surgery recommended upper GI series which was unremarkable. ?Continue gentle IV fluid hydration.  Continue home PPI.   ?General surgery recommended to continue on liquid or soft diet should always eat in upright position.  No further recommendation.  General surgery signed off. ? ?AKI (acute kidney injury) ([place]) ?Likely prerenal from dehydration.  Baseline serum creatinine 0.5-0.6. ?Presented with creatinine 1.5 which is improved with IV hydration. ?Avoid nephrotoxic  medications ? ? ?Hypokalemia ?QT prolongation: ?Electrolyte replaced and corrected. ?Avoid QT prolonging medications. ?Repeat EKG. ? ?Elevated liver enzymes ?Status postcholecystectomy.  No acute hepatobiliary abnormality on CT ?Liver enzymes slightly elevated now normalized. ? ? ? ?Hypochloremia ?Improved.  Continue to monitor ? ?Chronic diastolic CHF (congestive heart failure) ([place]) ?Stable.  No signs of volume overload. ?-Monitor volume status closely ? ?Dyslipidemia ?Continue Crestor ? ?Hypothyroidism ?Continue Synthroid. ? ?Sick sinus syndrome ([place]) ?Status post PPM ? ?A-fib ([place]) ?Patient does have a pacemaker. ?Blood pressure is soft.  Continue amiodarone and hold Coreg.   ?Resumed Eliquis. ? ? ?Subjective: Patient was seen and examined at bedside.  Overnight events noted. ?Patient reports feeling better but denies any pain. ?She states she feels full immediately after she eats.  Upper GI series unremarkable. ? ?Physical Exam: ?Vitals:  ? 06/05/21 0445 06/05/21 1333 06/05/21 1946 06/06/21 0403  ?BP: (!) 119/56 (!) 123/59 (!) 114/52 (!) 111/51  ?Pulse: 60 63 60 (!) 59  ?Resp: 12 16 18 16   ?Temp: 98 ?F (36.7 ?C) 97.8 ?F (36.6 ?C) (!) 97.4 ?F (36.3 ?C) 98.3 ?F (36.8 ?C)  ?TempSrc: Oral  Oral Oral  ?SpO2: 98% 100% 100% 99%  ?Weight:      ?Height:      ? ?General exam: Appears comfortable, not in any acute distress. ?Respiratory : Clear to auscultation bilaterally, normal respiratory effort. ?Cardiovascular : S1-S2 heard, regular rate and rhythm, no murmur. ?Gastrointestinal : Abdomen is soft, nontender, nondistended, BS+ ?Central nervous system: Alert and oriented x3, no focal neurological deficits. ?Extremities: No edema, no cyanosis, no clubbing. ?Psychiatry: Mood, insight, judgment normal. ? ?Data Reviewed: ?I have Reviewed nursing notes, Vitals, and Lab results since pt's last encounter. Pertinent lab results CBC, BMP ?I have ordered test  including CBC, BMP ?I have reviewed the last note from general surgery,   ?I have discussed pt's care plan and test results with patient.  ? ?Family Communication: No family at bedside ? ?Disposition: ? ? ?Status is: Inpatient ?Remains inpatient appropriate because:  ?  ? ?Admitted for intractable hiatal hernia abdominal bloating, early satiety , upper GI series unremarkable. ?General surgery recommended to continue liquid or soft diet and always in upright sitting position. ?General surgery signed off. ? ? ?Planned Discharge Destination: Home ? ? ? ?Time spent: 35 minutes ? ?Author: ?Shawna Clamp, MD ?06/06/2021 3:21 PM ? ?For on call review [email or web address].  ?

## 2021-06-06 NOTE — Discharge Instructions (Signed)
Advised to follow up with PCP in one week. ?Advised to follow up General surgery in 1 weeks. ?Advised to take liquid/ soft consistency foods and sit in upright position after finishing meals for 30 mins. ?

## 2021-06-06 NOTE — Plan of Care (Signed)
Pt for discharge home this evening with son.  ?AVS Printed and reviewed with patient and son, Mellody Dance at bedside.  ?Follow up and diet instructions reviewed. No new medications.  ?All questions addressed, verbalized understanding.  ? ?Problem: Education: ?Goal: Knowledge of General Education information will improve ?Description: Including pain rating scale, medication(s)/side effects and non-pharmacologic comfort measures ?06/06/2021 1809 by Macon Large, RN ?Outcome: Adequate for Discharge ?  ?Problem: Clinical Measurements: ?Goal: Ability to maintain clinical measurements within normal limits will improve ?06/06/2021 1809 by Macon Large, RN ?Outcome: Adequate for Discharge ?  ?Problem: Nutrition: ?Goal: Adequate nutrition will be maintained ?06/06/2021 1809 by Macon Large, RN ?Outcome: Adequate for Discharge ?  ?Problem: Coping: ?Goal: Level of anxiety will decrease ?06/06/2021 1809 by Macon Large, RN ?Outcome: Adequate for Discharge ?  ?Problem: Pain Managment: ?Goal: General experience of comfort will improve ?06/06/2021 1809 by Macon Large, RN ?Outcome: Adequate for Discharge ?  ?Problem: Safety: ?Goal: Ability to remain free from injury will improve ?06/06/2021 1809 by Macon Large, RN ?Outcome: Adequate for Discharge ?  ?

## 2021-06-06 NOTE — Discharge Summary (Signed)
Physician Discharge Summary   Patient: Danielle Blanchard MRN: [id number] DOB: 01-Jan-1941  Admit date:     06/03/2021  Discharge date: 06/06/21  Discharge Physician: Cipriano Bunker   PCP: [facility]   Recommendations at discharge:  Advised to follow up with PCP in one week. Advised to follow up General surgery in 1 weeks. Advised to take liquid / soft consistency foods and sit in upright position after finishing meals for 30 mins. Advised to follow-up with general surgery in 1 week.  Discharge Diagnoses: Principal Problem:   Incarcerated hiatal hernia Active Problems:   Hypokalemia   AKI (acute kidney injury) (HCC)   Hypochloremia   Elevated liver enzymes   A-fib (HCC)   Sick sinus syndrome (HCC)   Hypothyroidism   Dyslipidemia   Insomnia   IDA (iron deficiency anemia)   Memory changes   On amiodarone therapy   S/P placement of cardiac pacemaker   Chronic diastolic CHF (congestive heart failure) (HCC)   History of PEG Gastrostomy tube in place 03/2021    Failure to thrive in adult  Resolved Problems:   * No resolved hospital problems. [facility] Course: This 81 years old female with PMH significant for HTN, hyperlipidemia, sick sinus syndrome s/p PPM, paroxysmal A-fib, chronic diastolic CHF, hypothyroidism admitted with abdominal pain, decreased appetite, bloating.  CT abdomen which showed intractable hiatal hernia. Dr. Dwain Sarna from general surgery evaluated the patient and felt that her symptoms are related to hiatal hernia but she does not have volvulus.  General surgery recommended UGI while inpatient.  Patient was also found to have hypokalemia and other electrolyte abnormalities which were replaced and now corrected.  Upper GI series unremarkable.  General surgery recommended to remain on liquid diet, always eat in upright position.  No surgical intervention.  Patient feels better and want to be discharged.  Plan of care explained to the son in  detail.  Patient is being discharged home.   Assessment and Plan: * Incarcerated hiatal hernia Failure to thrive: Patient with known large hiatal hernia, presented with nausea, upper abdominal discomfort, early satiety, bloating, weight loss. CT A/P : Large hiatal hernia containing proximal stomach.The intrathoracic portion of the stomach is mildly distended with air. There is some wall thickening and surrounding fluid involving the proximal stomach. Patient was evaluated by general surgery Dr. Cleophas Dunker.  He reports patient's symptoms are related to hiatal hernia but she does not have volvulus. General surgery recommended upper GI series which was unremarkable. Continue gentle IV fluid hydration.  Continue home PPI.   General surgery recommended to continue on liquid or soft diet, should always eat in upright position.  No further recommendation.  General surgery signed off.  AKI (acute kidney injury) (HCC) Likely prerenal from dehydration.  Baseline serum creatinine 0.5-0.6. Presented with creatinine 1.5 which is improved with IV hydration. Avoid nephrotoxic medications   Hypokalemia QT prolongation: Electrolyte replaced and corrected. Avoid QT prolonging medications.   Elevated liver enzymes Status postcholecystectomy.  No acute hepatobiliary abnormality on CT Liver enzymes slightly elevated now normalized.    Hypochloremia Improved.  Continue to monitor  Chronic diastolic CHF (congestive heart failure) (HCC) Stable.  No signs of volume overload. -Monitor volume status closely  Dyslipidemia Continue Crestor  Hypothyroidism Continue Synthroid.  Sick sinus syndrome (HCC) Status post PPM  A-fib [facility]) Patient does have a pacemaker. Blood pressure is soft.  Continue amiodarone and hold Coreg.   Resumed Eliquis.   Consultants: General surgery Procedures performed: UGI  series.  Disposition: Home Diet recommendation:  Discharge Diet Orders (From admission, onward)      Start     Ordered   06/06/21 0000  Diet - low sodium heart healthy        06/06/21 1751   06/06/21 0000  Diet full liquid        06/06/21 1751           Full liquid diet DISCHARGE MEDICATION: Allergies as of 06/06/2021   No Known Allergies      Medication List     STOP taking these medications    bisacodyl 10 MG suppository Commonly known as: DULCOLAX   free water Soln   polyethylene glycol 17 g packet Commonly known as: MIRALAX / GLYCOLAX       TAKE these medications    acetaminophen 500 MG tablet Commonly known as: TYLENOL Take 500 mg by mouth every 6 (six) hours as needed for moderate pain or headache.   amiodarone 200 MG tablet Commonly known as: PACERONE Take 200 mg by mouth daily.   apixaban 5 MG Tabs tablet Commonly known as: ELIQUIS Take 5 mg by mouth 2 (two) times daily.   carvedilol 3.125 MG tablet Commonly known as: COREG Take 3.125 mg by mouth 2 (two) times daily with a meal.   docusate sodium 100 MG capsule Commonly known as: COLACE Take 1 capsule (100 mg total) by mouth 2 (two) times daily. What changed:  when to take this reasons to take this   feeding supplement Liqd Take 237 mLs by mouth 4 (four) times daily. What changed: when to take this   lactulose 10 GM/15ML solution Commonly known as: CHRONULAC Take 30 mLs (20 g total) by mouth daily. What changed:  when to take this reasons to take this   levothyroxine 75 MCG tablet Commonly known as: SYNTHROID Take 75 mcg by mouth daily before breakfast.   lidocaine 5 % Commonly known as: LIDODERM Place 1 patch onto the skin daily. Remove & Discard patch within 12 hours or as directed by MD   Melatonin 10 MG Tabs Take 10 mg by mouth at bedtime.   pantoprazole 40 MG tablet Commonly known as: PROTONIX Take 1 tablet (40 mg total) by mouth daily. What changed:  when to take this reasons to take this   rosuvastatin 20 MG tablet Commonly known as: CRESTOR Take 20 mg by  mouth daily.   senna 8.6 MG Tabs tablet Commonly known as: SENOKOT Take 1 tablet (8.6 mg total) by mouth daily. What changed:  when to take this reasons to take this   Systane 0.4-0.3 % Gel ophthalmic gel Generic drug: Polyethyl Glycol-Propyl Glycol Place 1 application into both eyes 3 (three) times daily as needed. What changed: reasons to take this   traMADol-acetaminophen 37.5-325 MG tablet Commonly known as: ULTRACET Take 1 tablet by mouth every 6 (six) hours as needed for severe pain.        Follow-up Information     Luretha Murphy, MD Follow up.   Specialty: General Surgery Why: As needed Contact information: [address] [place] [address] [phone]         [facility] Follow up in 1 week(s).   Specialty: Family Medicine               Discharge Exam: Filed Weights   06/04/21 1244  Weight: 64.6 kg   General exam: Appears comfortable, not in any acute distress. Respiratory system: CTA  bilaterally, no wheezing, no crackles normal respiratory effort. Cardiovascular system: S1-S2 heard, regular rate and rhythm, no murmur. Gastrointestinal system: Abdomen is soft, nondistended, mild tenderness in the epigastric region, BS+ Central nervous system: Alert and oriented x3, no focal neurological deficits. Extremities: No edema, no cyanosis, no clubbing. Psychiatry: Mood, insight, judgment normal   Condition at discharge: good  The results of significant diagnostics from this hospitalization (including imaging, microbiology, ancillary and laboratory) are listed below for reference.   Imaging Studies: CT ABDOMEN PELVIS W CONTRAST  Result Date: 06/03/2021 CLINICAL DATA:  Evaluate for bowel obstruction. Previous hiatal hernia volvulus status post surgery. EXAM: CT ABDOMEN AND PELVIS WITH CONTRAST TECHNIQUE: Multidetector CT imaging of the abdomen and pelvis was performed using the standard protocol following  bolus administration of intravenous contrast. RADIATION DOSE REDUCTION: This exam was performed according to the departmental dose-optimization program which includes automated exposure control, adjustment of the mA and/or kV according to patient size and/or use of iterative reconstruction technique. CONTRAST:  75mL OMNIPAQUE IOHEXOL 300 MG/ML  SOLN COMPARISON:  CT abdomen and pelvis 05/20/2021. FINDINGS: Lower chest: Chronic peripheral reticular opacities are unchanged in both lung bases. Hepatobiliary: No focal liver abnormality is seen. Status post cholecystectomy. No biliary dilatation. Pancreas: Unremarkable. No pancreatic ductal dilatation or surrounding inflammatory changes. Spleen: Normal in size without focal abnormality. Adrenals/Urinary Tract: Adrenal glands are unremarkable. Kidneys are normal, without renal calculi, focal lesion, or hydronephrosis. Bladder is unremarkable. Stomach/Bowel: Stomach and small bowel are nondilated. There is sigmoid colon diverticulosis without evidence for acute diverticulitis. The appendix is within normal limits. Again seen is a large hiatal hernia containing mid and proximal stomach. The intrathoracic portion of the stomach is mildly distended with air. There is some wall thickening and surrounding fluid at the level of the proximal stomach incompletely imaged. Vascular/Lymphatic: Aortic atherosclerosis. No enlarged abdominal or pelvic lymph nodes. Reproductive: Uterus and bilateral adnexa are unremarkable. Other: No ascites or free air. Small fat containing inguinal hernias bilaterally. Musculoskeletal: Stable compression deformity of T12. The bones are diffusely osteopenic. IMPRESSION: 1. Again seen is a large hiatal hernia containing the proximal stomach. The intrathoracic portion of the stomach is mildly distended with air. There is some wall thickening and surrounding fluid involving the proximal stomach (incompletely imaged). Correlate clinically for gastritis.  Surrounding free fluid may be related to recent surgery. Differential diagnosis would include perforation in the appropriate clinical setting. Electronically Signed   By: Darliss CheneyAmy  Guttmann M.D.   On: 06/03/2021 20:12   CT ABDOMEN PELVIS W CONTRAST  Result Date: 05/20/2021 CLINICAL DATA:  81 year old female with nausea vomiting, weakness, failure to thrive and hiatal hernia. EXAM: CT ABDOMEN AND PELVIS WITH CONTRAST TECHNIQUE: Multidetector CT imaging of the abdomen and pelvis was performed using the standard protocol following bolus administration of intravenous contrast. RADIATION DOSE REDUCTION: This exam was performed according to the departmental dose-optimization program which includes automated exposure control, adjustment of the mA and/or kV according to patient size and/or use of iterative reconstruction technique. CONTRAST:  100mL OMNIPAQUE IOHEXOL 300 MG/ML  SOLN COMPARISON:  03/10/2021 prior CTs FINDINGS: Lower chest: No acute abnormality noted. A very large hiatal hernia is again identified. Hepatobiliary: No hepatic abnormalities are identified. The patient is status post cholecystectomy. There is no evidence of intrahepatic or extrahepatic biliary dilatation. Pancreas: Unremarkable Spleen: Unremarkable Adrenals/Urinary Tract: The kidneys, adrenal glands and bladder are unremarkable. Stomach/Bowel: A very large hiatal hernia is again noted. There is no evidence of bowel wall thickening, bowel inflammatory changes  or bowel obstruction. Vascular/Lymphatic: Aortic atherosclerosis. No enlarged abdominal or pelvic lymph nodes. Reproductive: Uterus and bilateral adnexa are unremarkable. Other: No ascites, focal collection or pneumoperitoneum. Musculoskeletal: No acute or suspicious bony abnormalities are identified. T12 INFERIOR endplate compression fracture is unchanged. IMPRESSION: 1. No evidence of acute abnormality. 2. Very large hiatal hernia. 3. Aortic Atherosclerosis (ICD10-I70.0). Electronically  Signed   By: Harmon PierJeffrey  Hu M.D.   On: 05/20/2021 21:07   DG UGI W SINGLE CM (SOL OR THIN BA)  Result Date: 06/05/2021 CLINICAL DATA:  Inpatient, known hiatal hernia, concern for incarceration/volvulus, early satiety, nausea, recent removal of G-tube. EXAM: DG UGI W SINGLE CM TECHNIQUE: Scout radiograph was obtained. Limited single contrast examination was performed using thin liquid barium. Exam limited by patient clinical status and mobility. This exam was performed by Sheliah PlaneHayley Boisseau, PA-C, and was supervised and interpreted by Guadlupe SpanishPraneil Patel, MD. FLUOROSCOPY TIME:  Radiation Exposure Index (as provided by the fluoroscopic device): 1.80mG y COMPARISON:  CT A/P 06/03/21. FINDINGS: Scout Radiograph: Normal bowel gas pattern. Hiatal hernia not well appreciated on this supine scout film as compared to prior plain films Esophagus: Mildly patulous. Esophageal motility: Tertiary contractions of the distal esophagus. Gastroesophageal reflux:  None visualized. Ingested 13mm barium tablet:  Not given Stomach: J-shaped stomach. Moderate size hernia with sliding and paraesophageal components. There is organoaxial rotation of the intrathoracic portion. There is delayed emptying of contrast through the diaphragmatic hiatus when in a supine position. Contrast passes into the intra-abdominal portion of the stomach without substantial delay when patient is in a 45 degree recumbent position. Gastric emptying: Normal. Duodenum: Limited evaluation in this study. No obvious stricture or mass. Other:  None. IMPRESSION: Technically limited. Moderate size mixed type hiatal hernia. Organoaxial rotation of the intrathoracic portion without obstruction. Contrast passage is delayed through the diaphragmatic hiatus when supine but there is no substantial delay when semi-recumbent. Electronically Signed   By: Guadlupe SpanishPraneil  Patel M.D.   On: 06/05/2021 15:38    Microbiology: Results for orders placed or performed during the hospital encounter of  06/03/21  Resp Panel by RT-PCR (Flu A&B, Covid) Nasopharyngeal Swab     Status: None   Collection Time: 06/03/21  6:27 PM   Specimen: Nasopharyngeal Swab; Nasopharyngeal(NP) swabs in vial transport medium  Result Value Ref Range Status   SARS Coronavirus 2 by RT PCR NEGATIVE NEGATIVE Final    Comment: (NOTE) SARS-CoV-2 target nucleic acids are NOT DETECTED.  The SARS-CoV-2 RNA is generally detectable in upper respiratory specimens during the acute phase of infection. The lowest concentration of SARS-CoV-2 viral copies this assay can detect is 138 copies/mL. A negative result does not preclude SARS-Cov-2 infection and should not be used as the sole basis for treatment or other patient management decisions. A negative result may occur with  improper specimen collection/handling, submission of specimen other than nasopharyngeal swab, presence of viral mutation(s) within the areas targeted by this assay, and inadequate number of viral copies(<138 copies/mL). A negative result must be combined with clinical observations, patient history, and epidemiological information. The expected result is Negative.  Fact Sheet for Patients:  BloggerCourse.comhttps://www.fda.gov/media/152166/download  Fact Sheet for Healthcare Providers:  SeriousBroker.ithttps://www.fda.gov/media/152162/download  This test is no t yet approved or cleared by the [place] FDA and  has been authorized for detection and/or diagnosis of SARS-CoV-2 by FDA under an Emergency Use Authorization (EUA). This EUA will remain  in effect (meaning this test can be used) for the duration of the COVID-19 declaration under Section 564(b)(1) of  the Act, 21 U.S.C.section 360bbb-3(b)(1), unless the authorization is terminated  or revoked sooner.       Influenza A by PCR NEGATIVE NEGATIVE Final   Influenza B by PCR NEGATIVE NEGATIVE Final    Comment: (NOTE) The Xpert Xpress SARS-CoV-2/FLU/RSV plus assay is intended as an aid in the diagnosis of influenza from  Nasopharyngeal swab specimens and should not be used as a sole basis for treatment. Nasal washings and aspirates are unacceptable for Xpert Xpress SARS-CoV-2/FLU/RSV testing.  Fact Sheet for Patients: BloggerCourse.com  Fact Sheet for Healthcare Providers: SeriousBroker.it  This test is not yet approved or cleared by the [place] FDA and has been authorized for detection and/or diagnosis of SARS-CoV-2 by FDA under an Emergency Use Authorization (EUA). This EUA will remain in effect (meaning this test can be used) for the duration of the COVID-19 declaration under Section 564(b)(1) of the Act, 21 U.S.C. section 360bbb-3(b)(1), unless the authorization is terminated or revoked.  Performed at [facility], [address], [place], [place] [address]   Urine Culture     Status: Abnormal   Collection Time: 06/03/21 10:12 PM   Specimen: Urine, Clean Catch  Result Value Ref Range Status   Specimen Description   Final    URINE, CLEAN CATCH Performed at [facility], [address] [place], [place] [address]    Special Requests   Final    NONE Performed at [facility], [address] [place], [place] [address]    Culture MULTIPLE SPECIES PRESENT, SUGGEST RECOLLECTION (A)  Final   Report Status 06/05/2021 FINAL  Final    Labs: CBC: Recent Labs  Lab 06/03/21 1816 06/05/21 0444 06/06/21 0538  WBC 5.9 4.6 4.8  NEUTROABS 3.7  --   --   HGB 11.0* 10.1* 10.6*  HCT 33.1* 31.0* 32.6*  MCV 90.7 93.1 92.1  PLT 187 160 170   Basic Metabolic Panel: Recent Labs  Lab 06/03/21 1816 06/03/21 2020 06/03/21 2335 06/04/21 0754 06/05/21 0444 06/06/21 0538  NA 134* 132*  --  134* 137 138  K <2.0* 3.3* <2.0* 2.6* 4.8 4.2  CL 91* 92*  --  95* 102 102  CO2 33* 32  --  32 31 30  GLUCOSE 101* 103*  --  94 82 85  BUN 19 18  --  13 8 6*  CREATININE 1.55* 1.34*  --  1.19* 1.02*  1.05*  CALCIUM 8.7* 8.0*  --  8.4* 9.0 9.0  MG 2.5*  --   --  2.6* 2.3  --   PHOS  --   --   --  3.7  --   --    Liver Function Tests: Recent Labs  Lab 06/03/21 1816 06/03/21 2020 06/04/21 0754 06/05/21 0444  AST 57* 65* 49* 50*  ALT 46* 38 42 42  ALKPHOS 96 85 82 80  BILITOT 0.8 1.7* 0.5 0.7  PROT 7.1 6.5 6.2* 5.9*  ALBUMIN 3.8 3.3* 3.4* 3.1*   CBG: No results for input(s): GLUCAP in the last 168 hours.  Discharge time spent: greater than 30 minutes.  Signed: Cipriano Bunker, MD [facility] 06/06/2021

## 2021-08-21 ENCOUNTER — Emergency Department (HOSPITAL_COMMUNITY)
Admission: EM | Admit: 2021-08-21 | Discharge: 2021-08-22 | Disposition: A | Discharge status: Home / Self Care | Payer: Medicare Other | Attending: Emergency Medicine | Admitting: Emergency Medicine

## 2021-08-21 ENCOUNTER — Emergency Department (HOSPITAL_COMMUNITY): Payer: Medicare Other

## 2021-08-21 ENCOUNTER — Other Ambulatory Visit: Payer: Self-pay

## 2021-08-21 DIAGNOSIS — I10 Essential (primary) hypertension: Secondary | ICD-10-CM | POA: Diagnosis not present

## 2021-08-21 DIAGNOSIS — K449 Diaphragmatic hernia without obstruction or gangrene: Secondary | ICD-10-CM | POA: Diagnosis not present

## 2021-08-21 DIAGNOSIS — Z7901 Long term (current) use of anticoagulants: Secondary | ICD-10-CM | POA: Diagnosis not present

## 2021-08-21 DIAGNOSIS — Z950 Presence of cardiac pacemaker: Secondary | ICD-10-CM | POA: Diagnosis not present

## 2021-08-21 DIAGNOSIS — R079 Chest pain, unspecified: Secondary | ICD-10-CM | POA: Diagnosis present

## 2021-08-21 DIAGNOSIS — R0789 Other chest pain: Secondary | ICD-10-CM

## 2021-08-21 DIAGNOSIS — E876 Hypokalemia: Secondary | ICD-10-CM | POA: Diagnosis not present

## 2021-08-21 LAB — CBC
HCT: 30.5 % — ABNORMAL LOW (ref 36.0–46.0)
Hemoglobin: 9.6 g/dL — ABNORMAL LOW (ref 12.0–15.0)
MCH: 30.5 pg (ref 26.0–34.0)
MCHC: 31.5 g/dL (ref 30.0–36.0)
MCV: 96.8 fL (ref 80.0–100.0)
Platelets: 140 10*3/uL — ABNORMAL LOW (ref 150–400)
RBC: 3.15 MIL/uL — ABNORMAL LOW (ref 3.87–5.11)
RDW: 14.5 % (ref 11.5–15.5)
WBC: 5.1 10*3/uL (ref 4.0–10.5)
nRBC: 0 % (ref 0.0–0.2)

## 2021-08-21 NOTE — ED Provider Notes (Incomplete)
[facility] EMERGENCY DEPARTMENT Provider Note   CSN: [id number] Arrival date & time: 08/21/21  2303     History {Add pertinent medical, surgical, social history, OB history to HPI:1} Chief Complaint  Patient presents with   Chest Pain    Danielle Blanchard is a 81 y.o. female.  HPI     This is an 81 year old female with a history of hypertension and hypercholesterolemia, pacemaker who presents with chest pain.  Patient reports anterior chest pain that is pressure-like.  She reports significant radiation to the back.  She was sitting on the couch when this occurred.  She has not noticed any weakness, numbness, tingling of the upper or lower extremities.  No shortness of breath but does report some worsening with deep breathing.  Generally states she has not felt well over the last several days but no fevers.    Home Medications Prior to Admission medications   Medication Sig Start Date End Date Taking? Authorizing Provider  acetaminophen (TYLENOL) 500 MG tablet Take 500 mg by mouth every 6 (six) hours as needed for moderate pain or headache.    [provider]  amiodarone (PACERONE) 200 MG tablet Take 200 mg by mouth daily.    [provider]  apixaban (ELIQUIS) 5 MG TABS tablet Take 5 mg by mouth 2 (two) times daily.    [provider]  carvedilol (COREG) 3.125 MG tablet Take 3.125 mg by mouth 2 (two) times daily with a meal.    [provider]  docusate sodium (COLACE) 100 MG capsule Take 1 capsule (100 mg total) by mouth 2 (two) times daily. Patient taking differently: Take 100 mg by mouth daily as needed for mild constipation. 03/21/21   Regalado, Belkys A, MD  feeding supplement (ENSURE ENLIVE / ENSURE PLUS) LIQD Take 237 mLs by mouth 4 (four) times daily. Patient taking differently: Take 237 mLs by mouth daily. 03/21/21   Regalado, Belkys A, MD  lactulose (CHRONULAC) 10 GM/15ML solution Take 30 mLs (20 g total) by mouth  daily. Patient taking differently: Take 20 g by mouth daily as needed for moderate constipation. 03/22/21   Regalado, Belkys A, MD  levothyroxine (SYNTHROID) 75 MCG tablet Take 75 mcg by mouth daily before breakfast.    [provider]  lidocaine (LIDODERM) 5 % Place 1 patch onto the skin daily. Remove & Discard patch within 12 hours or as directed by MD 03/22/21   Regalado, Jon Billings A, MD  Melatonin 10 MG TABS Take 10 mg by mouth at bedtime.    [provider]  pantoprazole (PROTONIX) 40 MG tablet Take 1 tablet (40 mg total) by mouth daily. Patient taking differently: Take 40 mg by mouth daily as needed (heartburn). 03/22/21   Regalado, Belkys A, MD  Polyethyl Glycol-Propyl Glycol (SYSTANE) 0.4-0.3 % GEL ophthalmic gel Place 1 application into both eyes 3 (three) times daily as needed. Patient taking differently: Place 1 application into both eyes 3 (three) times daily as needed (for dry eyes). 02/15/20   Cathie Hoops, Amy V, PA-C  rosuvastatin (CRESTOR) 20 MG tablet Take 20 mg by mouth daily.    [provider]  senna (SENOKOT) 8.6 MG TABS tablet Take 1 tablet (8.6 mg total) by mouth daily. Patient taking differently: Take 1 tablet by mouth daily as needed for moderate constipation. 03/22/21   Regalado, Belkys A, MD  traMADol-acetaminophen (ULTRACET) 37.5-325 MG tablet Take 1 tablet by mouth every 6 (six) hours as needed for severe pain.  [provider]      Allergies    Patient has no known allergies.    Review of Systems   Review of Systems  Constitutional:  Negative for fever.  Cardiovascular:  Positive for chest pain.  Gastrointestinal:  Negative for abdominal pain, nausea and vomiting.  Musculoskeletal:  Positive for back pain.  All other systems reviewed and are negative.  Physical Exam Updated Vital Signs BP 121/65 (BP Location: Left Arm)   Pulse (!) 59   Temp 98.4 F (36.9 C) (Oral)   Resp 15   Ht 1.575 m (5\' 2" )   Wt 64 kg   SpO2 100%   BMI  25.81 kg/m  Physical Exam Vitals and nursing note reviewed.  Constitutional:      Appearance: She is well-developed.     Comments: Elderly, nontoxic-appearing  HENT:     Head: Normocephalic and atraumatic.  Eyes:     Pupils: Pupils are equal, round, and reactive to light.  Cardiovascular:     Rate and Rhythm: Normal rate and regular rhythm.     Heart sounds: Normal heart sounds.     Comments: Pacemaker palpated left upper chest Pulmonary:     Effort: Pulmonary effort is normal. No respiratory distress.     Breath sounds: No wheezing.  Abdominal:     General: Bowel sounds are normal.     Palpations: Abdomen is soft.  Musculoskeletal:     Cervical back: Neck supple.     Right lower leg: No edema.     Left lower leg: No edema.  Skin:    General: Skin is warm and dry.  Neurological:     Mental Status: She is alert and oriented to person, place, and time.  Psychiatric:        Mood and Affect: Mood normal.    ED Results / Procedures / Treatments   Labs (all labs ordered are listed, but only abnormal results are displayed) Labs Reviewed  BASIC METABOLIC PANEL  CBC  TROPONIN I (HIGH SENSITIVITY)    EKG None  Radiology No results found.  Procedures Procedures  {Document cardiac monitor, telemetry assessment procedure when appropriate:1}  Medications Ordered in ED Medications - No data to display  ED Course/ Medical Decision Making/ A&P                           Medical Decision Making Amount and/or Complexity of Data Reviewed Labs: ordered. Radiology: ordered.   ***  {Document critical care time when appropriate:1} {Document review of labs and clinical decision tools ie heart score, Chads2Vasc2 etc:1}  {Document your independent review of radiology images, and any outside records:1} {Document your discussion with family members, caretakers, and with consultants:1} {Document social determinants of health affecting pt's care:1} {Document your decision  making why or why not admission, treatments were needed:1} Final Clinical Impression(s) / ED Diagnoses Final diagnoses:  None    Rx / DC Orders ED Discharge Orders     None

## 2021-08-21 NOTE — ED Triage Notes (Signed)
BIB [facility] from home. Sitting on couch- Central CP starting around 2100- radiating into back- worse with deep breathing. Been feeling weak- poor appetite for a few days.   324 ASA NTG x1   120/70 111 CBG

## 2021-08-22 ENCOUNTER — Emergency Department (HOSPITAL_COMMUNITY): Payer: Medicare Other

## 2021-08-22 LAB — TROPONIN I (HIGH SENSITIVITY)
Troponin I (High Sensitivity): 6 ng/L (ref <18)
Troponin I (High Sensitivity): 7 ng/L (ref <18)

## 2021-08-22 LAB — BASIC METABOLIC PANEL
Anion gap: 7 (ref 5–15)
BUN: 19 mg/dL (ref 8–23)
CO2: 23 mmol/L (ref 22–32)
Calcium: 8.3 mg/dL — ABNORMAL LOW (ref 8.9–10.3)
Chloride: 103 mmol/L (ref 98–111)
Creatinine, Ser: 1.03 mg/dL — ABNORMAL HIGH (ref 0.44–1.00)
GFR, Estimated: 54 mL/min — ABNORMAL LOW (ref >60)
Glucose, Bld: 105 mg/dL — ABNORMAL HIGH (ref 70–99)
Potassium: 2.9 mmol/L — ABNORMAL LOW (ref 3.5–5.1)
Sodium: 133 mmol/L — ABNORMAL LOW (ref 135–145)

## 2021-08-22 MED ORDER — IOHEXOL 350 MG/ML SOLN
80.0000 mL | Freq: Once | INTRAVENOUS | Status: AC | PRN
  Administered 2021-08-22: 80 mL via INTRAVENOUS

## 2021-08-22 MED ORDER — POTASSIUM CHLORIDE CRYS ER 20 MEQ PO TBCR: 20.0000 meq | EXTENDED_RELEASE_TABLET | Freq: Two times a day (BID) | ORAL | 0 refills | Status: DC

## 2021-08-22 MED ORDER — POTASSIUM CHLORIDE CRYS ER 20 MEQ PO TBCR
40.0000 meq | EXTENDED_RELEASE_TABLET | Freq: Once | ORAL | Status: AC
  Administered 2021-08-22: 40 meq via ORAL
  Filled 2021-08-22: qty 2

## 2021-08-22 MED ORDER — POTASSIUM CHLORIDE 10 MEQ/100ML IV SOLN
10.0000 meq | INTRAVENOUS | Status: AC
  Administered 2021-08-22 (×2): 10 meq via INTRAVENOUS
  Filled 2021-08-22 (×2): qty 100

## 2021-08-22 MED ORDER — ALUM & MAG HYDROXIDE-SIMETH 200-200-20 MG/5ML PO SUSP
30.0000 mL | Freq: Once | ORAL | Status: AC
Start: 2021-08-22 — End: 2021-08-22
  Administered 2021-08-22: 30 mL via ORAL
  Filled 2021-08-22: qty 30

## 2021-08-22 NOTE — Discharge Instructions (Addendum)
You were seen today for atypical chest pain and abdominal pain.  This is likely related to your known hiatal hernia.  Follow back up with general surgery regarding your options.  Continue pantoprazole at home.

## 2021-08-22 NOTE — ED Notes (Signed)
Patient transported to CT 

## 2021-08-22 NOTE — ED Notes (Signed)
Patient verbalizes understanding of discharge instructions. Opportunity for questioning and answers were provided. Armband removed by staff, pt discharged from ED. Pt taken to ED waiting room via wheel chair.  

## 2021-10-17 ENCOUNTER — Emergency Department (HOSPITAL_COMMUNITY)
Admission: EM | Admit: 2021-10-17 | Discharge: 2021-10-19 | Disposition: A | Discharge status: Home / Self Care | Payer: Medicare Other | Attending: Emergency Medicine | Admitting: Emergency Medicine

## 2021-10-17 DIAGNOSIS — Z79899 Other long term (current) drug therapy: Secondary | ICD-10-CM | POA: Diagnosis not present

## 2021-10-17 DIAGNOSIS — I10 Essential (primary) hypertension: Secondary | ICD-10-CM | POA: Diagnosis not present

## 2021-10-17 DIAGNOSIS — R770 Abnormality of albumin: Secondary | ICD-10-CM | POA: Diagnosis not present

## 2021-10-17 DIAGNOSIS — Z022 Encounter for examination for admission to residential institution: Secondary | ICD-10-CM | POA: Diagnosis not present

## 2021-10-17 DIAGNOSIS — R7401 Elevation of levels of liver transaminase levels: Secondary | ICD-10-CM | POA: Diagnosis not present

## 2021-10-17 DIAGNOSIS — E785 Hyperlipidemia, unspecified: Secondary | ICD-10-CM | POA: Insufficient documentation

## 2021-10-17 DIAGNOSIS — Z59819 Housing instability, housed unspecified: Secondary | ICD-10-CM | POA: Diagnosis present

## 2021-10-17 DIAGNOSIS — Z7901 Long term (current) use of anticoagulants: Secondary | ICD-10-CM | POA: Insufficient documentation

## 2021-10-17 DIAGNOSIS — D696 Thrombocytopenia, unspecified: Secondary | ICD-10-CM | POA: Diagnosis not present

## 2021-10-17 DIAGNOSIS — E876 Hypokalemia: Secondary | ICD-10-CM | POA: Insufficient documentation

## 2021-10-17 LAB — CBC WITH DIFFERENTIAL/PLATELET
Abs Immature Granulocytes: 0.01 10*3/uL (ref 0.00–0.07)
Basophils Absolute: 0 10*3/uL (ref 0.0–0.1)
Basophils Relative: 1 %
Eosinophils Absolute: 0.1 10*3/uL (ref 0.0–0.5)
Eosinophils Relative: 2 %
HCT: 32.5 % — ABNORMAL LOW (ref 36.0–46.0)
Hemoglobin: 10.5 g/dL — ABNORMAL LOW (ref 12.0–15.0)
Immature Granulocytes: 0 %
Lymphocytes Relative: 16 %
Lymphs Abs: 0.7 10*3/uL (ref 0.7–4.0)
MCH: 29.8 pg (ref 26.0–34.0)
MCHC: 32.3 g/dL (ref 30.0–36.0)
MCV: 92.3 fL (ref 80.0–100.0)
Monocytes Absolute: 0.4 10*3/uL (ref 0.1–1.0)
Monocytes Relative: 9 %
Neutro Abs: 3.2 10*3/uL (ref 1.7–7.7)
Neutrophils Relative %: 72 %
Platelets: 149 10*3/uL — ABNORMAL LOW (ref 150–400)
RBC: 3.52 MIL/uL — ABNORMAL LOW (ref 3.87–5.11)
RDW: 13.6 % (ref 11.5–15.5)
WBC: 4.4 10*3/uL (ref 4.0–10.5)
nRBC: 0 % (ref 0.0–0.2)

## 2021-10-17 LAB — COMPREHENSIVE METABOLIC PANEL
ALT: 33 U/L (ref 0–44)
AST: 42 U/L — ABNORMAL HIGH (ref 15–41)
Albumin: 3.4 g/dL — ABNORMAL LOW (ref 3.5–5.0)
Alkaline Phosphatase: 79 U/L (ref 38–126)
Anion gap: 7 (ref 5–15)
BUN: 15 mg/dL (ref 8–23)
CO2: 28 mmol/L (ref 22–32)
Calcium: 8.8 mg/dL — ABNORMAL LOW (ref 8.9–10.3)
Chloride: 103 mmol/L (ref 98–111)
Creatinine, Ser: 0.75 mg/dL (ref 0.44–1.00)
GFR, Estimated: >60 mL/min (ref >60)
Glucose, Bld: 117 mg/dL — ABNORMAL HIGH (ref 70–99)
Potassium: 3.1 mmol/L — ABNORMAL LOW (ref 3.5–5.1)
Sodium: 138 mmol/L (ref 135–145)
Total Bilirubin: 0.6 mg/dL (ref 0.3–1.2)
Total Protein: 6.6 g/dL (ref 6.5–8.1)

## 2021-10-17 LAB — URINALYSIS, ROUTINE W REFLEX MICROSCOPIC
Bilirubin Urine: NEGATIVE
Glucose, UA: NEGATIVE mg/dL
Hgb urine dipstick: NEGATIVE
Ketones, ur: NEGATIVE mg/dL
Leukocytes,Ua: NEGATIVE
Nitrite: NEGATIVE
Protein, ur: NEGATIVE mg/dL
Specific Gravity, Urine: 1.016 (ref 1.005–1.030)
pH: 5 (ref 5.0–8.0)

## 2021-10-17 MED ORDER — ROSUVASTATIN CALCIUM 20 MG PO TABS
10.0000 mg | ORAL_TABLET | Freq: Every day | ORAL | Status: DC
  Administered 2021-10-18 – 2021-10-19 (×2): 10 mg via ORAL
  Filled 2021-10-17 (×2): qty 1

## 2021-10-17 MED ORDER — CARVEDILOL 3.125 MG PO TABS
3.1250 mg | ORAL_TABLET | Freq: Two times a day (BID) | ORAL | Status: DC
  Administered 2021-10-18 – 2021-10-19 (×3): 3.125 mg via ORAL
  Filled 2021-10-17 (×3): qty 1

## 2021-10-17 MED ORDER — HYDROCHLOROTHIAZIDE 12.5 MG PO CAPS: 12.5000 mg | ORAL_CAPSULE | Freq: Once | ORAL | Status: DC

## 2021-10-17 MED ORDER — LOSARTAN POTASSIUM 25 MG PO TABS
50.0000 mg | ORAL_TABLET | Freq: Every day | ORAL | Status: DC
Start: 2021-10-18 — End: 2021-10-19
  Administered 2021-10-18 – 2021-10-19 (×2): 50 mg via ORAL
  Filled 2021-10-17 (×2): qty 2

## 2021-10-17 MED ORDER — AMIODARONE HCL 200 MG PO TABS
200.0000 mg | ORAL_TABLET | Freq: Every day | ORAL | Status: DC
  Administered 2021-10-18 – 2021-10-19 (×2): 200 mg via ORAL
  Filled 2021-10-17 (×2): qty 1

## 2021-10-17 MED ORDER — POTASSIUM CHLORIDE CRYS ER 20 MEQ PO TBCR
40.0000 meq | EXTENDED_RELEASE_TABLET | Freq: Once | ORAL | Status: AC
  Administered 2021-10-17: 40 meq via ORAL
  Filled 2021-10-17: qty 2

## 2021-10-17 MED ORDER — APIXABAN 5 MG PO TABS
5.0000 mg | ORAL_TABLET | Freq: Two times a day (BID) | ORAL | Status: DC
  Administered 2021-10-18 – 2021-10-19 (×4): 5 mg via ORAL
  Filled 2021-10-17 (×4): qty 1

## 2021-10-17 MED ORDER — LEVOTHYROXINE SODIUM 88 MCG PO TABS
88.0000 ug | ORAL_TABLET | Freq: Every day | ORAL | Status: DC
  Administered 2021-10-18 – 2021-10-19 (×2): 88 ug via ORAL
  Filled 2021-10-17 (×2): qty 1

## 2021-10-17 MED ORDER — HYDROCHLOROTHIAZIDE 12.5 MG PO TABS
12.5000 mg | ORAL_TABLET | Freq: Every day | ORAL | Status: DC
  Administered 2021-10-18 – 2021-10-19 (×2): 12.5 mg via ORAL
  Filled 2021-10-17 (×2): qty 1

## 2021-10-17 NOTE — ED Notes (Signed)
Pt able to walk to bathroom with steady gait.

## 2021-10-17 NOTE — ED Triage Notes (Signed)
Ems brings pt in from apartment. Pt will be evicted today. DSS involved and recommended pt to come to the ER for evaluation and be sent to rehab. Pt has no complaints.

## 2021-10-17 NOTE — ED Notes (Signed)
DSS worker Dorene Sorrow [employer] [phone]) states pt must be medically cleared to go to [facility].  [facility] contact is Star Alfredo Bach [phone]

## 2021-10-17 NOTE — Progress Notes (Signed)
TOC CSW received a call from pt's APS worker informing this Clinical research associate the reason she was sent to the hospital. Pt's APS worker stated pt was evicted from her home and is now homeless. APS worker is requesting SNF placement. cSW informed APS worker about the process for Skilled nursing placement. Pt will need to have a rehab need for her insurance to approve the placement. CSW informed APS worker CSW will request for physical therapy eval for recommendations. CSW also informed pt's APS worker pt does not have Medicaid and would not be able to get LTC placement. TOC to follow.   Valentina Shaggy.Joeanne Robicheaux, MSW, LCSWA [facility]  Transitions of Care Clinical Social Worker I Direct Dial: [phone]  Fax: [phone] [email or web address]

## 2021-10-17 NOTE — ED Provider Notes (Signed)
[facility]-EMERGENCY DEPT Provider Note   CSN: [id number] Arrival date & time: 10/17/21  1419     History No chief complaint on file.   Danielle Blanchard is a 81 y.o. female with h/o HTN and HLD presents to the ED for rehab placement. The patient and her son were being evicted today from their apartment. DSS was involved and recommended that the patient come to the ER for evaluation and sent to rehab. Patient has no complaints. Patient is ambulatory with cane or walker at baseline.   HPI     Home Medications Prior to Admission medications   Medication Sig Start Date End Date Taking? Authorizing Provider  acetaminophen (TYLENOL) 500 MG tablet Take 500 mg by mouth every 6 (six) hours as needed for moderate pain or headache.    [provider]  amiodarone (PACERONE) 200 MG tablet Take 200 mg by mouth daily.    [provider]  apixaban (ELIQUIS) 5 MG TABS tablet Take 5 mg by mouth 2 (two) times daily.    [provider]  carvedilol (COREG) 3.125 MG tablet Take 3.125 mg by mouth 2 (two) times daily with a meal.    [provider]  docusate sodium (COLACE) 100 MG capsule Take 1 capsule (100 mg total) by mouth 2 (two) times daily. Patient taking differently: Take 100 mg by mouth daily as needed for mild constipation. 03/21/21   Regalado, Belkys A, MD  feeding supplement (ENSURE ENLIVE / ENSURE PLUS) LIQD Take 237 mLs by mouth 4 (four) times daily. Patient taking differently: Take 237 mLs by mouth daily. 03/21/21   Regalado, Belkys A, MD  lactulose (CHRONULAC) 10 GM/15ML solution Take 30 mLs (20 g total) by mouth daily. Patient taking differently: Take 20 g by mouth daily as needed for moderate constipation. 03/22/21   Regalado, Belkys A, MD  levothyroxine (SYNTHROID) 75 MCG tablet Take 75 mcg by mouth daily before breakfast.    [provider]  lidocaine (LIDODERM) 5 % Place 1 patch onto the skin daily. Remove & Discard patch  within 12 hours or as directed by MD 03/22/21   Regalado, Jon Billings A, MD  Melatonin 10 MG TABS Take 10 mg by mouth at bedtime.    [provider]  pantoprazole (PROTONIX) 40 MG tablet Take 1 tablet (40 mg total) by mouth daily. Patient taking differently: Take 40 mg by mouth daily as needed (heartburn). 03/22/21   Regalado, Belkys A, MD  Polyethyl Glycol-Propyl Glycol (SYSTANE) 0.4-0.3 % GEL ophthalmic gel Place 1 application into both eyes 3 (three) times daily as needed. Patient taking differently: Place 1 application into both eyes 3 (three) times daily as needed (for dry eyes). 02/15/20   Cathie Hoops, Amy V, PA-C  potassium chloride SA (KLOR-CON M) 20 MEQ tablet Take 1 tablet (20 mEq total) by mouth 2 (two) times daily. 08/22/21   Horton, Mayer Masker, MD  rosuvastatin (CRESTOR) 20 MG tablet Take 20 mg by mouth daily.    [provider]  senna (SENOKOT) 8.6 MG TABS tablet Take 1 tablet (8.6 mg total) by mouth daily. Patient taking differently: Take 1 tablet by mouth daily as needed for moderate constipation. 03/22/21   Regalado, Belkys A, MD  traMADol-acetaminophen (ULTRACET) 37.5-325 MG tablet Take 1 tablet by mouth every 6 (six) hours as needed for severe pain.    [provider]      Allergies    Patient has no known allergies.    Review of Systems  Review of Systems  Constitutional:  Negative for fever.  Respiratory:  Negative for shortness of breath.   Cardiovascular:  Negative for chest pain.  Gastrointestinal:  Negative for abdominal pain, nausea and vomiting.  Genitourinary:  Negative for dysuria and hematuria.  Neurological:  Negative for weakness.    Physical Exam Updated Vital Signs BP 122/60 (BP Location: Left Arm)   Pulse 77   Temp 98.7 F (37.1 C) (Oral)   Resp 16   SpO2 98%  Physical Exam Vitals and nursing note reviewed.  Constitutional:      Appearance: Normal appearance.  HENT:     Head: Normocephalic and atraumatic.  Eyes:     General: No  scleral icterus. Cardiovascular:     Rate and Rhythm: Normal rate.  Pulmonary:     Effort: Pulmonary effort is normal. No respiratory distress.     Breath sounds: Normal breath sounds.  Abdominal:     General: Abdomen is flat. Bowel sounds are normal.     Palpations: Abdomen is soft.  Musculoskeletal:        General: No deformity.  Skin:    General: Skin is warm and dry.  Neurological:     General: No focal deficit present.     Mental Status: She is alert. Mental status is at baseline.     ED Results / Procedures / Treatments   Labs (all labs ordered are listed, but only abnormal results are displayed) Labs Reviewed  CBC WITH DIFFERENTIAL/PLATELET - Abnormal; Notable for the following components:      Result Value   RBC 3.52 (*)    Hemoglobin 10.5 (*)    HCT 32.5 (*)    Platelets 149 (*)    All other components within normal limits  COMPREHENSIVE METABOLIC PANEL - Abnormal; Notable for the following components:   Potassium 3.1 (*)    Glucose, Bld 117 (*)    Calcium 8.8 (*)    Albumin 3.4 (*)    AST 42 (*)    All other components within normal limits  URINALYSIS, ROUTINE W REFLEX MICROSCOPIC - Abnormal; Notable for the following components:   APPearance HAZY (*)    All other components within normal limits    EKG None  Radiology No results found.  Procedures Procedures   Medications Ordered in ED Medications - No data to display  ED Course/ Medical Decision Making/ A&P                           Medical Decision Making Amount and/or Complexity of Data Reviewed Labs: ordered.  Risk Prescription drug management.   81 year old female presents the emergency department for evaluation of rehab placement.  Vital signs are stable.  Physical exam is unremarkable.  We will order medical clearance labs in addition to evaluation of PT so that patient to be placed in a rehab setting.  I independently reviewed and interpreted the patient's labs.  CMP shows  decreased potassium at 3.1.  Did order 40 mEq of replacement orally.  Glucose slightly elevated 117 although not fasting.  Mild decrease in her calcium 8.8.  Mild decrease in her albumin at 3.4.  Mild increase in AST at 42.  CBC shows hemoglobin of 10.5 with slight thrombocytopenia at 149.  Her hemoglobin and platelet count have actually improved since her last labs a month ago.  Urinalysis shows hazy urine, otherwise normal.  Spoke with Social Work, Lafonda Mosses, who needs the patient to be medically  clear and to be evaluated by PT in order to be placed in rehab facility.   Patient will need to be evaluated by PT in the morning.  We will place as boarder status.  Question which medications the patient is on.  I was able to speak to the pharmacy tech and got a printout of her recent medications from [facility].  I ordered her rosuvastatin, losartan, levothyroxine, hydrochlorothiazide, carvedilol, Eliquis, and amiodarone all that have been refilled within the past 2 months.  Patient will be evaluated PT in the morning.  Patient is medically clear.  Diet ordered.  Home medications ordered.  Final Clinical Impression(s) / ED Diagnoses Final diagnoses:  Housing instability    Rx / DC Orders ED Discharge Orders     None         Achille Rich, Cordelia Poche 10/17/21 1946    Gerhard Munch, MD 10/18/21 [phone]

## 2021-10-18 NOTE — Evaluation (Signed)
Physical Therapy Evaluation Patient Details Name: Danielle Blanchard MRN: [id number] DOB: 1940/06/11 Today's Date: 10/18/2021  History of Present Illness  81 y.o. female with h/o HTN and HLD presents to the ED for rehab placement. The patient and her son were being evicted today from their apartment. DSS was involved and recommended that the patient come to the ER for evaluation and sent to rehab. Patient has no complaints. Patient is ambulatory with cane or walker at baseline.  Clinical Impression  Pt is mobilizing well at a modified independent level. She ambulated 160' with a straight cane, no loss of balance. She denies falls in the past 6 months. No further PT indicated, will sign off.        Recommendations for follow up therapy are one component of a multi-disciplinary discharge planning process, led by the attending physician.  Recommendations may be updated based on patient status, additional functional criteria and insurance authorization.  Follow Up Recommendations No PT follow up      Assistance Recommended at Discharge PRN  Patient can return home with the following  Direct supervision/assist for financial management;Direct supervision/assist for medications management    Equipment Recommendations None recommended by PT  Recommendations for Other Services       Functional Status Assessment Patient has not had a recent decline in their functional status     Precautions / Restrictions Precautions Precautions: None Precaution Comments: pt denies falls in past 6 months Restrictions Weight Bearing Restrictions: No      Mobility  Bed Mobility Overal bed mobility: Independent                  Transfers Overall transfer level: Modified independent Equipment used: Straight cane                    Ambulation/Gait Ambulation/Gait assistance: Modified independent (Device/Increase time) Gait Distance (Feet): 160 Feet Assistive device: Straight cane Gait  Pattern/deviations: WFL(Within Functional Limits) Gait velocity: WFL     General Gait Details: steady, no loss of balance  Stairs            Wheelchair Mobility    Modified Rankin (Stroke Patients Only)       Balance Overall balance assessment: Modified Independent                                           Pertinent Vitals/Pain Pain Assessment Pain Assessment: No/denies pain    Home Living Family/patient expects to be discharged to:: Unsure                   Additional Comments: was living with son in an apartment, pt stated they have to leave the apartment bc son hasn't worked for several months    Prior Function Prior Level of Function : Independent/Modified Independent             Mobility Comments: walks with a straight cane, denies falls in past 6 months. Pt also has a RW at home. ADLs Comments: Patient completes her own showers, light meal prep.  Son sets up medications and assist with community mobility and bill payment.     Hand Dominance   Dominant Hand: Right    Extremity/Trunk Assessment   Upper Extremity Assessment Upper Extremity Assessment: Overall WFL for tasks assessed    Lower Extremity Assessment Lower Extremity Assessment: Overall WFL for tasks assessed  Cervical / Trunk Assessment Cervical / Trunk Assessment: Normal  Communication   Communication: No difficulties  Cognition Arousal/Alertness: Awake/alert Behavior During Therapy: WFL for tasks assessed/performed Overall Cognitive Status: Within Functional Limits for tasks assessed                                          General Comments      Exercises     Assessment/Plan    PT Assessment Patient does not need any further PT services  PT Problem List         PT Treatment Interventions      PT Goals (Current goals can be found in the Care Plan section)  Acute Rehab PT Goals PT Goal Formulation: All assessment and  education complete, DC therapy    Frequency       Co-evaluation               AM-PAC PT "6 Clicks" Mobility  Outcome Measure Help needed turning from your back to your side while in a flat bed without using bedrails?: None Help needed moving from lying on your back to sitting on the side of a flat bed without using bedrails?: None Help needed moving to and from a bed to a chair (including a wheelchair)?: None Help needed standing up from a chair using your arms (e.g., wheelchair or bedside chair)?: None Help needed to walk in hospital room?: None Help needed climbing 3-5 steps with a railing? : None 6 Click Score: 24    End of Session Equipment Utilized During Treatment: Gait belt Activity Tolerance: Patient tolerated treatment well Patient left: in chair;with call bell/phone within reach Nurse Communication: Mobility status      Time: 5852-7782 PT Time Calculation (min) (ACUTE ONLY): 10 min   Charges:   PT Evaluation $PT Eval Low Complexity: 1 Low         Ralene Bathe Kistler PT 10/18/2021  Acute Rehabilitation Services  Office [phone]

## 2021-10-18 NOTE — Progress Notes (Signed)
TOC CSW attempted to contact pt's APS worker, no answer, was unable to leave a VM.   Valentina Shaggy.Nala Kachel, MSW, LCSWA [facility]  Transitions of Care Clinical Social Worker I Direct Dial: [phone]  Fax: [phone] [email or web address]

## 2021-10-19 NOTE — ED Provider Notes (Signed)
Emergency Medicine Observation Re-evaluation Note  Danielle Blanchard is a 81 y.o. female, seen on rounds today.  Pt initially presented to the ED for complaints of No chief complaint on file. Currently, the patient is resting comfortably.  Physical Exam  BP (!) 128/49 (BP Location: Left Arm)   Pulse 62   Temp 98.2 F (36.8 C) (Oral)   Resp 20   Ht 5\' 2"  (1.575 m)   Wt 62 kg   SpO2 97%   BMI 25.00 kg/m  Physical Exam Vitals and nursing note reviewed.  Constitutional:      General: She is not in acute distress.    Appearance: She is well-developed.  HENT:     Head: Normocephalic and atraumatic.  Eyes:     Conjunctiva/sclera: Conjunctivae normal.  Cardiovascular:     Rate and Rhythm: Normal rate and regular rhythm.     Heart sounds: No murmur heard. Pulmonary:     Effort: Pulmonary effort is normal. No respiratory distress.  Musculoskeletal:        General: No swelling.     Cervical back: Neck supple.  Skin:    General: Skin is warm and dry.     Capillary Refill: Capillary refill takes less than 2 seconds.  Neurological:     Mental Status: She is alert.  Psychiatric:        Mood and Affect: Mood normal.    \  ED Course / MDM  EKG:   I have reviewed the labs performed to date as well as medications administered while in observation.  Recent changes in the last 24 hours include patient cleared by PT.  Pending social work recommendations.  Plan  Current plan is pending social work recommendations.  Danielle Blanchard is not under involuntary commitment.     Micheline Rough, MD 10/19/21 [phone]

## 2021-10-19 NOTE — Progress Notes (Signed)
CSW spoke with pt's APS worker, he reported pt's daughter-in-law Danielle Blanchard will be picking pt up today.  CSW spoke with pt's daughter-in-law Danielle Blanchard ([phone]), she reported she can pick pt up today, but it will be in the afternoon. TOC to follow.    Valentina Shaggy.Berdena Cisek, MSW, LCSWA [facility]  Transitions of Care Clinical Social Worker I Direct Dial: [phone]  Fax: [phone] [email or web address]

## 2022-03-13 ENCOUNTER — Emergency Department (HOSPITAL_COMMUNITY): Payer: Medicare Other

## 2022-03-13 ENCOUNTER — Inpatient Hospital Stay (HOSPITAL_COMMUNITY)
Admission: EM | Admit: 2022-03-13 | Discharge: 2022-03-15 | DRG: 392 | Disposition: A | Discharge status: Skilled Nursing Facility | Payer: Medicare Other | Attending: Internal Medicine | Admitting: Internal Medicine

## 2022-03-13 ENCOUNTER — Other Ambulatory Visit: Payer: Self-pay

## 2022-03-13 ENCOUNTER — Encounter (HOSPITAL_COMMUNITY): Payer: Self-pay | Admitting: Internal Medicine

## 2022-03-13 DIAGNOSIS — K219 Gastro-esophageal reflux disease without esophagitis: Secondary | ICD-10-CM | POA: Diagnosis present

## 2022-03-13 DIAGNOSIS — G8929 Other chronic pain: Secondary | ICD-10-CM | POA: Diagnosis present

## 2022-03-13 DIAGNOSIS — K5909 Other constipation: Secondary | ICD-10-CM | POA: Diagnosis present

## 2022-03-13 DIAGNOSIS — E7800 Pure hypercholesterolemia, unspecified: Secondary | ICD-10-CM | POA: Diagnosis present

## 2022-03-13 DIAGNOSIS — I480 Paroxysmal atrial fibrillation: Secondary | ICD-10-CM | POA: Diagnosis present

## 2022-03-13 DIAGNOSIS — R109 Unspecified abdominal pain: Secondary | ICD-10-CM

## 2022-03-13 DIAGNOSIS — I4891 Unspecified atrial fibrillation: Secondary | ICD-10-CM | POA: Diagnosis present

## 2022-03-13 DIAGNOSIS — I10 Essential (primary) hypertension: Secondary | ICD-10-CM | POA: Diagnosis present

## 2022-03-13 DIAGNOSIS — N179 Acute kidney failure, unspecified: Secondary | ICD-10-CM | POA: Diagnosis present

## 2022-03-13 DIAGNOSIS — I495 Sick sinus syndrome: Secondary | ICD-10-CM | POA: Diagnosis present

## 2022-03-13 DIAGNOSIS — Z7901 Long term (current) use of anticoagulants: Secondary | ICD-10-CM

## 2022-03-13 DIAGNOSIS — Z79890 Hormone replacement therapy (postmenopausal): Secondary | ICD-10-CM

## 2022-03-13 DIAGNOSIS — Z79899 Other long term (current) drug therapy: Secondary | ICD-10-CM | POA: Diagnosis not present

## 2022-03-13 DIAGNOSIS — K4090 Unilateral inguinal hernia, without obstruction or gangrene, not specified as recurrent: Secondary | ICD-10-CM | POA: Diagnosis present

## 2022-03-13 DIAGNOSIS — R739 Hyperglycemia, unspecified: Secondary | ICD-10-CM | POA: Diagnosis present

## 2022-03-13 DIAGNOSIS — E785 Hyperlipidemia, unspecified: Secondary | ICD-10-CM | POA: Diagnosis present

## 2022-03-13 DIAGNOSIS — I110 Hypertensive heart disease with heart failure: Secondary | ICD-10-CM | POA: Diagnosis present

## 2022-03-13 DIAGNOSIS — E876 Hypokalemia: Secondary | ICD-10-CM | POA: Diagnosis present

## 2022-03-13 DIAGNOSIS — E039 Hypothyroidism, unspecified: Secondary | ICD-10-CM | POA: Diagnosis present

## 2022-03-13 DIAGNOSIS — Z950 Presence of cardiac pacemaker: Secondary | ICD-10-CM

## 2022-03-13 DIAGNOSIS — I5032 Chronic diastolic (congestive) heart failure: Secondary | ICD-10-CM | POA: Diagnosis present

## 2022-03-13 DIAGNOSIS — F0390 Unspecified dementia without behavioral disturbance: Secondary | ICD-10-CM | POA: Diagnosis present

## 2022-03-13 DIAGNOSIS — R627 Adult failure to thrive: Secondary | ICD-10-CM | POA: Diagnosis present

## 2022-03-13 DIAGNOSIS — R1013 Epigastric pain: Principal | ICD-10-CM

## 2022-03-13 DIAGNOSIS — K440 Diaphragmatic hernia with obstruction, without gangrene: Principal | ICD-10-CM | POA: Diagnosis present

## 2022-03-13 LAB — COMPREHENSIVE METABOLIC PANEL
ALT: 44 U/L (ref 0–44)
AST: 58 U/L — ABNORMAL HIGH (ref 15–41)
Albumin: 3.6 g/dL (ref 3.5–5.0)
Alkaline Phosphatase: 79 U/L (ref 38–126)
Anion gap: 11 (ref 5–15)
BUN: 20 mg/dL (ref 8–23)
CO2: 34 mmol/L — ABNORMAL HIGH (ref 22–32)
Calcium: 9.3 mg/dL (ref 8.9–10.3)
Chloride: 91 mmol/L — ABNORMAL LOW (ref 98–111)
Creatinine, Ser: 1.22 mg/dL — ABNORMAL HIGH (ref 0.44–1.00)
GFR, Estimated: 44 mL/min — ABNORMAL LOW (ref >60)
Glucose, Bld: 193 mg/dL — ABNORMAL HIGH (ref 70–99)
Potassium: 2.2 mmol/L — CL (ref 3.5–5.1)
Sodium: 136 mmol/L (ref 135–145)
Total Bilirubin: 0.9 mg/dL (ref 0.3–1.2)
Total Protein: 7.1 g/dL (ref 6.5–8.1)

## 2022-03-13 LAB — URINALYSIS, ROUTINE W REFLEX MICROSCOPIC
Bilirubin Urine: NEGATIVE
Glucose, UA: NEGATIVE mg/dL
Ketones, ur: NEGATIVE mg/dL
Nitrite: NEGATIVE
Protein, ur: 100 mg/dL — AB
Specific Gravity, Urine: 1.023 (ref 1.005–1.030)
pH: 7 (ref 5.0–8.0)

## 2022-03-13 LAB — CBC
HCT: 38.8 % (ref 36.0–46.0)
Hemoglobin: 12.8 g/dL (ref 12.0–15.0)
MCH: 29.9 pg (ref 26.0–34.0)
MCHC: 33 g/dL (ref 30.0–36.0)
MCV: 90.7 fL (ref 80.0–100.0)
Platelets: 155 10*3/uL (ref 150–400)
RBC: 4.28 MIL/uL (ref 3.87–5.11)
RDW: 15.9 % — ABNORMAL HIGH (ref 11.5–15.5)
WBC: 7.7 10*3/uL (ref 4.0–10.5)
nRBC: 0 % (ref 0.0–0.2)

## 2022-03-13 LAB — RENAL FUNCTION PANEL
Albumin: 3.2 g/dL — ABNORMAL LOW (ref 3.5–5.0)
Anion gap: 9 (ref 5–15)
BUN: 18 mg/dL (ref 8–23)
CO2: 30 mmol/L (ref 22–32)
Calcium: 8.7 mg/dL — ABNORMAL LOW (ref 8.9–10.3)
Chloride: 97 mmol/L — ABNORMAL LOW (ref 98–111)
Creatinine, Ser: 0.83 mg/dL (ref 0.44–1.00)
GFR, Estimated: >60 mL/min (ref >60)
Glucose, Bld: 106 mg/dL — ABNORMAL HIGH (ref 70–99)
Phosphorus: 2.8 mg/dL (ref 2.5–4.6)
Potassium: 2.7 mmol/L — CL (ref 3.5–5.1)
Sodium: 136 mmol/L (ref 135–145)

## 2022-03-13 LAB — MAGNESIUM: Magnesium: 2 mg/dL (ref 1.7–2.4)

## 2022-03-13 LAB — LIPASE, BLOOD: Lipase: 36 U/L (ref 11–51)

## 2022-03-13 MED ORDER — ALUM & MAG HYDROXIDE-SIMETH 200-200-20 MG/5ML PO SUSP
30.0000 mL | Freq: Once | ORAL | Status: AC
  Administered 2022-03-13: 30 mL via ORAL
  Filled 2022-03-13: qty 30

## 2022-03-13 MED ORDER — POTASSIUM CHLORIDE 10 MEQ/100ML IV SOLN
10.0000 meq | INTRAVENOUS | Status: AC
  Administered 2022-03-13 (×2): 10 meq via INTRAVENOUS
  Filled 2022-03-13 (×4): qty 100

## 2022-03-13 MED ORDER — FENTANYL CITRATE PF 50 MCG/ML IJ SOSY
12.5000 ug | PREFILLED_SYRINGE | INTRAMUSCULAR | Status: DC | PRN
  Administered 2022-03-14 (×3): 12.5 ug via INTRAVENOUS
  Filled 2022-03-13 (×3): qty 1

## 2022-03-13 MED ORDER — OXYCODONE-ACETAMINOPHEN 5-325 MG PO TABS
1.0000 | ORAL_TABLET | Freq: Four times a day (QID) | ORAL | Status: DC | PRN
  Administered 2022-03-13 – 2022-03-15 (×4): 1 via ORAL
  Filled 2022-03-13 (×4): qty 1

## 2022-03-13 MED ORDER — POTASSIUM CHLORIDE CRYS ER 20 MEQ PO TBCR
40.0000 meq | EXTENDED_RELEASE_TABLET | Freq: Once | ORAL | Status: AC
  Administered 2022-03-13: 40 meq via ORAL
  Filled 2022-03-13: qty 2

## 2022-03-13 MED ORDER — PANTOPRAZOLE SODIUM 40 MG IV SOLR
40.0000 mg | INTRAVENOUS | Status: DC
  Administered 2022-03-13 – 2022-03-14 (×2): 40 mg via INTRAVENOUS
  Filled 2022-03-13 (×2): qty 10

## 2022-03-13 MED ORDER — FAMOTIDINE IN NACL 20-0.9 MG/50ML-% IV SOLN
20.0000 mg | Freq: Once | INTRAVENOUS | Status: AC
  Administered 2022-03-13: 20 mg via INTRAVENOUS
  Filled 2022-03-13: qty 50

## 2022-03-13 MED ORDER — SODIUM CHLORIDE 0.9 % IV SOLN: INTRAVENOUS | Status: DC

## 2022-03-13 MED ORDER — POTASSIUM CHLORIDE CRYS ER 20 MEQ PO TBCR
40.0000 meq | EXTENDED_RELEASE_TABLET | Freq: Two times a day (BID) | ORAL | Status: DC
  Administered 2022-03-13: 40 meq via ORAL
  Filled 2022-03-13: qty 2

## 2022-03-13 NOTE — ED Provider Notes (Signed)
[facility]-EMERGENCY DEPT Provider Note   CSN: [id number] Arrival date & time: 03/13/22  1410     History  Chief Complaint  Patient presents with   Abdominal Pain   Post-op Problem    Danielle Blanchard is a 81 y.o. female.  Patient is an 81 year old female with a past medical history of dementia, a fib on Eliquis, hypertension and recent hernia repair presenting to the emergency department with abdominal pain. The patient reports she has had pain across her upper abdomen for the past day. She denies any associated chest pain, fever, nausea, vomiting or diarrhea. She reports chronic constipation and denies any black or bloody stools. She denies any dysuria or hematuria.   I spoke with NH staff at [facility] who report she was treated for constipation last week with an enema and milk of magnesia. Today when she was given her meds she was complaining of pain of LUQ of abdomen. Nursing home staff states she had a mesh hernia repair several months ago but are unsure where her hernia was located or where the surgery was performed.  The history is provided by the patient, the nursing home and the EMS personnel. History limited by: Dementia.  Abdominal Pain      Home Medications Prior to Admission medications   Medication Sig Start Date End Date Taking? Authorizing Provider  acetaminophen (TYLENOL) 500 MG tablet Take 500 mg by mouth every 6 (six) hours as needed for moderate pain or headache.    [provider]  amiodarone (PACERONE) 200 MG tablet Take 200 mg by mouth in the morning.    [provider]  apixaban (ELIQUIS) 5 MG TABS tablet Take 5 mg by mouth in the morning and at bedtime.    [provider]  carvedilol (COREG) 3.125 MG tablet Take 3.125 mg by mouth in the morning and at bedtime.    [provider]  docusate sodium (COLACE) 100 MG capsule Take 1 capsule (100 mg total) by mouth 2 (two) times daily. Patient taking  differently: Take 100 mg by mouth daily as needed for mild constipation. 03/21/21   Regalado, Belkys A, MD  feeding supplement (ENSURE ENLIVE / ENSURE PLUS) LIQD Take 237 mLs by mouth 4 (four) times daily. Patient taking differently: Take 237 mLs by mouth daily. 03/21/21   Regalado, Belkys A, MD  hydrochlorothiazide (MICROZIDE) 12.5 MG capsule Take 12.5 mg by mouth in the morning.    [provider]  lactulose (CHRONULAC) 10 GM/15ML solution Take 30 mLs (20 g total) by mouth daily. Patient taking differently: Take 20 g by mouth daily as needed for moderate constipation. 03/22/21   Regalado, Belkys A, MD  levothyroxine (SYNTHROID) 88 MCG tablet Take 88 mcg by mouth daily before breakfast.    [provider]  lidocaine (LIDODERM) 5 % Place 1 patch onto the skin daily. Remove & Discard patch within 12 hours or as directed by MD 03/22/21   Regalado, Jon Billings A, MD  losartan (COZAAR) 50 MG tablet Take 50 mg by mouth in the morning and at bedtime.    [provider]  Melatonin 10 MG CAPS Take 10 mg by mouth at bedtime.    [provider]  pantoprazole (PROTONIX) 40 MG tablet Take 1 tablet (40 mg total) by mouth daily. Patient taking differently: Take 40 mg by mouth daily as needed (heartburn). 03/22/21   Regalado, Prentiss Bells, MD  Polyethyl Glycol-Propyl Glycol (SYSTANE) 0.4-0.3 % GEL ophthalmic gel Place 1  application into both eyes 3 (three) times daily as needed. Patient taking differently: Place 1 application  into both eyes 3 (three) times daily as needed (for dry eyes). 02/15/20   Cathie Hoops, Amy V, PA-C  potassium chloride SA (KLOR-CON M) 20 MEQ tablet Take 1 tablet (20 mEq total) by mouth 2 (two) times daily. 08/22/21   Horton, Mayer Masker, MD  rosuvastatin (CRESTOR) 10 MG tablet Take 10 mg by mouth daily.    [provider]  senna (SENOKOT) 8.6 MG TABS tablet Take 1 tablet (8.6 mg total) by mouth daily. Patient taking differently: Take 1 tablet by mouth daily as  needed for moderate constipation. 03/22/21   Regalado, Belkys A, MD  traMADol-acetaminophen (ULTRACET) 37.5-325 MG tablet Take 1 tablet by mouth every 6 (six) hours as needed for severe pain.    [provider]  traZODone (DESYREL) 50 MG tablet Take 50 mg by mouth at bedtime.    [provider]      Allergies    Patient has no known allergies.    Review of Systems   Review of Systems  Gastrointestinal:  Positive for abdominal pain.    Physical Exam Updated Vital Signs BP (!) 123/95 (BP Location: Right Arm)   Pulse (!) 57   Temp 98.2 F (36.8 C) (Oral)   Resp 12   Ht 5\' 2"  (1.575 m)   Wt 60.1 kg   SpO2 98%   BMI 24.22 kg/m  Physical Exam Vitals and nursing note reviewed.  Constitutional:      General: She is not in acute distress.    Appearance: She is well-developed.  HENT:     Head: Normocephalic and atraumatic.     Mouth/Throat:     Mouth: Mucous membranes are moist.     Pharynx: Oropharynx is clear.  Eyes:     Extraocular Movements: Extraocular movements intact.  Cardiovascular:     Rate and Rhythm: Normal rate and regular rhythm.     Heart sounds: Normal heart sounds.  Pulmonary:     Effort: Pulmonary effort is normal.     Breath sounds: Normal breath sounds.  Abdominal:     General: Abdomen is flat.     Palpations: Abdomen is soft.     Tenderness: There is abdominal tenderness in the epigastric area. There is guarding. There is no right CVA tenderness, left CVA tenderness or rebound.  Skin:    General: Skin is warm and dry.  Neurological:     General: No focal deficit present.     Mental Status: She is alert.     Comments: Oriented to person and place at neurologic baseline  Psychiatric:        Mood and Affect: Mood normal.        Behavior: Behavior normal.     ED Results / Procedures / Treatments   Labs (all labs ordered are listed, but only abnormal results are displayed) Labs Reviewed  COMPREHENSIVE METABOLIC PANEL - Abnormal;  Notable for the following components:      Result Value   Potassium 2.2 (*)    Chloride 91 (*)    CO2 34 (*)    Glucose, Bld 193 (*)    Creatinine, Ser 1.22 (*)    AST 58 (*)    GFR, Estimated 44 (*)    All other components within normal limits  CBC - Abnormal; Notable for the following components:   RDW 15.9 (*)    All other components within normal limits  LIPASE, BLOOD  URINALYSIS, ROUTINE W REFLEX MICROSCOPIC  MAGNESIUM    EKG EKG Interpretation  Date/Time:  Tuesday March 13 2022 15:59:38 EST Ventricular Rate:  60 PR Interval:  222 QRS Duration: 126 QT Interval:  631 QTC Calculation: 631 R Axis:   -44 Text Interpretation: Atrial-paced rhythm Nonspecific IVCD with LAD Left ventricular hypertrophy Abnormal T, consider ischemia, anterior leads Prolonged QT Confirmed by Elayne Snare ([id number]) on 03/13/2022 4:51:15 PM  Radiology No results found.  Procedures Procedures    Medications Ordered in ED Medications  potassium chloride 10 mEq in 100 mL IVPB (10 mEq Intravenous New Bag/Given 03/13/22 1638)  alum & mag hydroxide-simeth (MAALOX/MYLANTA) 200-200-20 MG/5ML suspension 30 mL (30 mLs Oral Given 03/13/22 1452)  famotidine (PEPCID) IVPB 20 mg premix (20 mg Intravenous New Bag/Given 03/13/22 1451)  potassium chloride SA (KLOR-CON M) CR tablet 40 mEq (40 mEq Oral Given 03/13/22 1638)    ED Course/ Medical Decision Making/ A&P Clinical Course as of 03/13/22 1654  Tue Mar 13, 2022  1649 I spoke with patient's son who clarified that she had a gastric volvulus with hiatal hernia approximately 1 year ago.  He states that she has continued to have epigastric pain since then.  He is unsure if she has seen a GI doctor since the symptoms have been going on.  Patient is found to have severe hypokalemia and will be repleted with IV and p.o. potassium and magnesium level will be performed.  EKG shows a prolonged QT.  Will require admission for potassium repletion and continued  symptom management. [VK]    Clinical Course User Index [VK] Rexford Maus, DO                           Medical Decision Making This patient presents to the ED with chief complaint(s) of abdominal pain with pertinent past medical history of dementia, a fib on Eliquis, HTN, HLD, recent hernia repair which further complicates the presenting complaint. The complaint involves an extensive differential diagnosis and also carries with it a high risk of complications and morbidity.    The differential diagnosis includes atypical ACS, gastritis, GERD, pancreatitis, hepatitis, cholelithiasis or cholecystitis, patient's abdomen is nondistended and has had recent bowel movements making obstruction less likely as she has had no fevers or vomiting making intra-abdominal infection less likely  Additional history obtained: Additional history obtained from EMS  and nursing home/care facility Records reviewed Nursing Home Documents  ED Course and Reassessment: Patient was awake and alert and well-appearing on arrival no acute distress.  She does have significant epigastric tenderness to palpation and will have labs and EKG performed.  She will be given a GI cocktail and will be closely reassessed.  Independent labs interpretation:  The following labs were independently interpreted: severe hypokalemia, mild AKI  Independent visualization of imaging: N/A  Consultation: - Consulted or discussed management/test interpretation w/ external professional: Hospitalist  Consideration for admission or further workup: patient requires admission for further electrolyte repletion Social Determinants of health: N/A    Amount and/or Complexity of Data Reviewed Labs: ordered.  Risk OTC drugs. Prescription drug management. Decision regarding hospitalization.          Final Clinical Impression(s) / ED Diagnoses Final diagnoses:  Epigastric pain  Hypokalemia    Rx / DC Orders ED Discharge  Orders     None         Rexford Maus, DO 03/13/22 1654

## 2022-03-13 NOTE — ED Notes (Signed)
ED TO INPATIENT HANDOFF REPORT  Name/Age/Gender Danielle Blanchard 81 y.o. female  Code Status Code Status History     Date Active Date Inactive Code Status Order ID Comments User Context   06/04/2021 0007 06/06/2021 2338 Full Code [id number]  John Giovanni, MD ED   03/16/2021 1332 03/21/2021 1932 DNR [id number]  Alba Cory, MD Inpatient   03/10/2021 0230 03/16/2021 1332 Full Code [id number]  John Giovanni, MD ED   02/12/2021 0131 02/16/2021 2316 Full Code [id number]  John Giovanni, MD Inpatient       Home/SNF/Other Skilled nursing facility  Chief Complaint Abdominal pain [R10.9]  Level of Care/Admitting Diagnosis ED Disposition     ED Disposition  Admit   Condition  --   Comment  Hospital Area: [facility] [[id number]]  Level of Care: Telemetry [5]  Admit to tele based on following criteria: Monitor for Ischemic changes  May admit patient to [facility] or [facility] if equivalent level of care is available:: No  Covid Evaluation: Asymptomatic - no recent exposure (last 10 days) testing not required  Diagnosis: Abdominal pain [379024]  Admitting Physician: Teddy Spike [[id number]]  Attending Physician: Teddy Spike [[id number]]  Certification:: I certify this patient will need inpatient services for at least 2 midnights  Estimated Length of Stay: 2          Medical History Past Medical History:  Diagnosis Date   High cholesterol    Hypertension     Allergies No Known Allergies  IV Location/Drains/Wounds Patient Lines/Drains/Airways Status     Active Line/Drains/Airways     Name Placement date Placement time Site Days   Peripheral IV 03/13/22 22 G 0.75" Left Antecubital 03/13/22  1423  Antecubital  less than 1            Labs/Imaging Results for orders placed or performed during the hospital encounter of 03/13/22 (from the past 48 hour(s))  Lipase, blood     Status: None   Collection Time: 03/13/22  2:24 PM  Result  Value Ref Range   Lipase 36 11 - 51 U/L    Comment: Performed at [facility], [address] [place], [place] [address]  Comprehensive metabolic panel     Status: Abnormal   Collection Time: 03/13/22  2:24 PM  Result Value Ref Range   Sodium 136 135 - 145 mmol/L   Potassium 2.2 (LL) 3.5 - 5.1 mmol/L    Comment: CRITICAL RESULT CALLED TO, READ BACK BY AND VERIFIED WITH DOSS,M AT 1600 ON 03/13/22 BY LUZOLOP    Chloride 91 (L) 98 - 111 mmol/L   CO2 34 (H) 22 - 32 mmol/L   Glucose, Bld 193 (H) 70 - 99 mg/dL    Comment: Glucose reference range applies only to samples taken after fasting for at least 8 hours.   BUN 20 8 - 23 mg/dL   Creatinine, Ser 2.22 (H) 0.44 - 1.00 mg/dL   Calcium 9.3 8.9 - 97.9 mg/dL   Total Protein 7.1 6.5 - 8.1 g/dL   Albumin 3.6 3.5 - 5.0 g/dL   AST 58 (H) 15 - 41 U/L   ALT 44 0 - 44 U/L   Alkaline Phosphatase 79 38 - 126 U/L   Total Bilirubin 0.9 0.3 - 1.2 mg/dL   GFR, Estimated 44 (L) >60 mL/min    Comment: (NOTE) Calculated using the CKD-EPI Creatinine Equation (2021)    Anion gap 11 5 - 15    Comment: Performed at  [facility], [address], [place], [place] [address]  CBC     Status: Abnormal   Collection Time: 03/13/22  2:24 PM  Result Value Ref Range   WBC 7.7 4.0 - 10.5 K/uL   RBC 4.28 3.87 - 5.11 MIL/uL   Hemoglobin 12.8 12.0 - 15.0 g/dL   HCT 05.3 97.6 - 73.4 %   MCV 90.7 80.0 - 100.0 fL   MCH 29.9 26.0 - 34.0 pg   MCHC 33.0 30.0 - 36.0 g/dL   RDW 19.3 (H) 79.0 - 24.0 %   Platelets 155 150 - 400 K/uL   nRBC 0.0 0.0 - 0.2 %    Comment: Performed at [facility], [address] [place], [place] [address]  Magnesium     Status: None   Collection Time: 03/13/22  6:17 PM  Result Value Ref Range   Magnesium 2.0 1.7 - 2.4 mg/dL    Comment: Performed at [facility], [address], [place], [place] [address]   CT ABDOMEN PELVIS WO CONTRAST  Result Date:  03/13/2022 CLINICAL DATA:  Left-sided abdominal pain, hernia repair 2 months ago EXAM: CT ABDOMEN AND PELVIS WITHOUT CONTRAST TECHNIQUE: Multidetector CT imaging of the abdomen and pelvis was performed following the standard protocol without IV contrast. Unenhanced CT was performed per clinician order. Lack of IV contrast limits sensitivity and specificity, especially for evaluation of abdominal/pelvic solid viscera. RADIATION DOSE REDUCTION: This exam was performed according to the departmental dose-optimization program which includes automated exposure control, adjustment of the mA and/or kV according to patient size and/or use of iterative reconstruction technique. COMPARISON:  08/22/2021 FINDINGS: Lower chest: No acute pleural or parenchymal lung disease. A large hiatal hernia is again identified containing the majority of the stomach as well as a large segment of the transverse colon. Hepatobiliary: Cholecystectomy. Stable high attenuation of the liver parenchyma consistent with amiodarone administration per history. No focal liver abnormality. No biliary duct dilation. Pancreas: Unremarkable unenhanced appearance. Spleen: Unremarkable unenhanced appearance. Adrenals/Urinary Tract: No urinary tract calculi or obstructive uropathy. The adrenals are unremarkable. Bladder is decompressed, limiting its evaluation. Stomach/Bowel: No bowel obstruction or ileus. No bowel wall thickening or inflammatory change. Large hiatal hernia as described above, containing the mid transverse colon as well as the majority of the stomach. Vascular/Lymphatic: Aortic atherosclerosis. No enlarged abdominal or pelvic lymph nodes. Reproductive: Uterus and bilateral adnexa are unremarkable. Other: No free fluid or free intraperitoneal gas. There is a small fat containing left inguinal hernia. No other ventral hernia. Musculoskeletal: No acute or destructive bony lesions. 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  IMPRESSION: 1. Large hiatal hernia containing the majority of the stomach as well as the mid transverse colon. 2. Small fat containing left inguinal hernia. 3. No acute intra-abdominal or intrapelvic process. 4.  Aortic Atherosclerosis (ICD10-I70.0). Electronically Signed   By: Sharlet Salina M.D.   On: 03/13/2022 18:09    Pending Labs Unresulted Labs (From admission, onward)     Start     Ordered   03/13/22 1424  Urinalysis, Routine w reflex microscopic  Once,   URGENT        03/13/22 1424   Signed and Held  Renal function panel  Once,   R        Signed and Held   Signed and Held  Comprehensive metabolic panel  Tomorrow morning,   R        Signed and Held   Signed and Held  CBC  Tomorrow morning,   R        Signed and Held   Signed and Held  Hemoglobin A1c  Once,   R        Signed and Held            Vitals/Pain Today's Vitals   03/13/22 1730 03/13/22 1816 03/13/22 1900 03/13/22 1908  BP: (!) 121/99  116/61   Pulse: 65  65   Resp: 20  (!) 24   Temp:  98.4 F (36.9 C)    TempSrc:  Oral    SpO2: 100%  98%   Weight:      Height:      PainSc:  10-Worst pain ever  9     Isolation Precautions No active isolations  Medications Medications  potassium chloride 10 mEq in 100 mL IVPB (10 mEq Intravenous New Bag/Given 03/13/22 1808)  oxyCODONE-acetaminophen (PERCOCET/ROXICET) 5-325 MG per tablet 1 tablet (1 tablet Oral Given 03/13/22 1823)  pantoprazole (PROTONIX) injection 40 mg (40 mg Intravenous Given 03/13/22 1801)  0.9 %  sodium chloride infusion (has no administration in time range)  alum & mag hydroxide-simeth (MAALOX/MYLANTA) 200-200-20 MG/5ML suspension 30 mL (30 mLs Oral Given 03/13/22 1452)  famotidine (PEPCID) IVPB 20 mg premix (0 mg Intravenous Stopped 03/13/22 1600)  potassium chloride SA (KLOR-CON M) CR tablet 40 mEq (40 mEq Oral Given 03/13/22 1638)    Mobility non-ambulatory

## 2022-03-13 NOTE — ED Triage Notes (Signed)
Pt BIBA from [facility] for left side abdominal pain. Had hernia repair LLQ 2 months ago, c/o intermittent pain since then, now it is constant 8/10. NP at facility recommended her to be sent for eval. Unsure is pain meds given at facility. Alert to baseline.  130/90 HR 70

## 2022-03-13 NOTE — H&P (Signed)
History and Physical    Patient: Danielle Blanchard [id number] DOB: 1941-03-06 DOA: 03/13/2022 DOS: the patient was seen and examined on 03/13/2022 PCP: Seward Meth, DO  Patient coming from: SNF  Chief Complaint:  Chief Complaint  Patient presents with   Abdominal Pain   Post-op Problem   HPI: Danielle Blanchard is a 81 y.o. female with medical history significant of dementia, SSS s/p PPM, HTN, HLD, PAF, chronic diastolic HF, hypothyroidism. Presenting with abdominal pain. She reports that she has chronic abdominal pain since she had her hiatal hernia repair. However, over the past 2 days she's had increased epigastric/LUQ abdominal pain that has radiated into her left back. She has not had any N/V/D or fevers. She has not had any sick contacts. She is not sure if she was given medicine at her facility to help. When her symptoms did not improve today, she was sent to the ED for evaluation. She denies any other aggravating or alleviating factors.   Review of Systems: As mentioned in the history of present illness. All other systems reviewed and are negative. Past Medical History:  Diagnosis Date   High cholesterol    Hypertension    Past Surgical History:  Procedure Laterality Date   ESOPHAGOGASTRODUODENOSCOPY (EGD) WITH PROPOFOL N/A 02/13/2021   Procedure: ESOPHAGOGASTRODUODENOSCOPY (EGD) WITH PROPOFOL;  Surgeon: Arta Silence, MD;  Location: [facility];  Service: Gastroenterology;  Laterality: N/A;   PACEMAKER PLACEMENT     PEG PLACEMENT N/A 03/14/2021   Procedure: PERCUTANEOUS ENDOSCOPIC GASTROSTOMY (PEG) PLACEMENT;  Surgeon: Johnathan Hausen, MD;  Location: [facility];  Service: General;  Laterality: N/A;   XI ROBOTIC ASSISTED HIATAL HERNIA REPAIR N/A 03/14/2021   Procedure: XI ROBOTIC ASSISTED TAKEDOWN OF INCARCERATED HIATAL HERNIA;  Surgeon: Johnathan Hausen, MD;  Location: [facility];  Service: General;  Laterality: N/A;   Social History:  reports that she has never smoked. She has never  used smokeless tobacco. She reports that she does not drink alcohol and does not use drugs.  No Known Allergies  No family history on file.  Prior to Admission medications   Medication Sig Start Date End Date Taking? Authorizing Provider  acetaminophen (TYLENOL) 500 MG tablet Take 500 mg by mouth every 6 (six) hours as needed for moderate pain or headache.    [provider]  amiodarone (PACERONE) 200 MG tablet Take 200 mg by mouth in the morning.    [provider]  apixaban (ELIQUIS) 5 MG TABS tablet Take 5 mg by mouth in the morning and at bedtime.    [provider]  carvedilol (COREG) 3.125 MG tablet Take 3.125 mg by mouth in the morning and at bedtime.    [provider]  docusate sodium (COLACE) 100 MG capsule Take 1 capsule (100 mg total) by mouth 2 (two) times daily. Patient taking differently: Take 100 mg by mouth daily as needed for mild constipation. 03/21/21   Regalado, Belkys A, MD  feeding supplement (ENSURE ENLIVE / ENSURE PLUS) LIQD Take 237 mLs by mouth 4 (four) times daily. Patient taking differently: Take 237 mLs by mouth daily. 03/21/21   Regalado, Belkys A, MD  hydrochlorothiazide (MICROZIDE) 12.5 MG capsule Take 12.5 mg by mouth in the morning.    [provider]  lactulose (CHRONULAC) 10 GM/15ML solution Take 30 mLs (20 g total) by mouth daily. Patient taking differently: Take 20 g by mouth daily as needed for moderate constipation. 03/22/21   Regalado, Jerald Kief A, MD  levothyroxine (SYNTHROID) 88 MCG tablet Take  88 mcg by mouth daily before breakfast.    [provider]  lidocaine (LIDODERM) 5 % Place 1 patch onto the skin daily. Remove & Discard patch within 12 hours or as directed by MD 03/22/21   Regalado, Jerald Kief A, MD  losartan (COZAAR) 50 MG tablet Take 50 mg by mouth in the morning and at bedtime.    [provider]  Melatonin 10 MG CAPS Take 10 mg by mouth at bedtime.    [provider]   pantoprazole (PROTONIX) 40 MG tablet Take 1 tablet (40 mg total) by mouth daily. Patient taking differently: Take 40 mg by mouth daily as needed (heartburn). 03/22/21   Regalado, Belkys A, MD  Polyethyl Glycol-Propyl Glycol (SYSTANE) 0.4-0.3 % GEL ophthalmic gel Place 1 application into both eyes 3 (three) times daily as needed. Patient taking differently: Place 1 application  into both eyes 3 (three) times daily as needed (for dry eyes). 02/15/20   Tasia Catchings, Amy V, PA-C  potassium chloride SA (KLOR-CON M) 20 MEQ tablet Take 1 tablet (20 mEq total) by mouth 2 (two) times daily. 08/22/21   Horton, Barbette Hair, MD  rosuvastatin (CRESTOR) 10 MG tablet Take 10 mg by mouth daily.    [provider]  senna (SENOKOT) 8.6 MG TABS tablet Take 1 tablet (8.6 mg total) by mouth daily. Patient taking differently: Take 1 tablet by mouth daily as needed for moderate constipation. 03/22/21   Regalado, Belkys A, MD  traMADol-acetaminophen (ULTRACET) 37.5-325 MG tablet Take 1 tablet by mouth every 6 (six) hours as needed for severe pain.    [provider]  traZODone (DESYREL) 50 MG tablet Take 50 mg by mouth at bedtime.    [provider]    Physical Exam: Vitals:   03/13/22 1420 03/13/22 1422 03/13/22 1600  BP: 119/62  (!) 123/95  Pulse: (!) 58  (!) 57  Resp: 18  12  Temp: 98.2 F (36.8 C)    TempSrc: Oral    SpO2: 100%  98%  Weight:  60.1 kg   Height:  5\' 2"  (1.575 m)    General: 81 y.o. female resting in bed in NAD Eyes: PERRL, normal sclera ENMT: Nares patent w/o discharge, orophaynx clear, dentition normal, ears w/o discharge/lesions/ulcers Neck: Supple, trachea midline Cardiovascular: RRR, +S1, S2, no m/g/r, equal pulses throughout Respiratory: CTABL, no w/r/r, normal WOB GI: BS+, ND, TTP epigastric/LUQ, no masses noted, no organomegaly noted MSK: No e/c/c Skin: No rashes, bruises, ulcerations noted Neuro: A&O x 2 (name, place), no focal deficits Psyc: Appropriate  interaction and affect, calm/cooperative  Data Reviewed:  Results for orders placed or performed during the hospital encounter of 03/13/22 (from the past 24 hour(s))  Lipase, blood     Status: None   Collection Time: 03/13/22  2:24 PM  Result Value Ref Range   Lipase 36 11 - 51 U/L  Comprehensive metabolic panel     Status: Abnormal   Collection Time: 03/13/22  2:24 PM  Result Value Ref Range   Sodium 136 135 - 145 mmol/L   Potassium 2.2 (LL) 3.5 - 5.1 mmol/L   Chloride 91 (L) 98 - 111 mmol/L   CO2 34 (H) 22 - 32 mmol/L   Glucose, Bld 193 (H) 70 - 99 mg/dL   BUN 20 8 - 23 mg/dL   Creatinine, Ser 1.22 (H) 0.44 - 1.00 mg/dL   Calcium 9.3 8.9 - 10.3 mg/dL   Total Protein 7.1 6.5 - 8.1 g/dL   Albumin 3.6  3.5 - 5.0 g/dL   AST 58 (H) 15 - 41 U/L   ALT 44 0 - 44 U/L   Alkaline Phosphatase 79 38 - 126 U/L   Total Bilirubin 0.9 0.3 - 1.2 mg/dL   GFR, Estimated 44 (L) >60 mL/min   Anion gap 11 5 - 15  CBC     Status: Abnormal   Collection Time: 03/13/22  2:24 PM  Result Value Ref Range   WBC 7.7 4.0 - 10.5 K/uL   RBC 4.28 3.87 - 5.11 MIL/uL   Hemoglobin 12.8 12.0 - 15.0 g/dL   HCT 94.1 74.0 - 81.4 %   MCV 90.7 80.0 - 100.0 fL   MCH 29.9 26.0 - 34.0 pg   MCHC 33.0 30.0 - 36.0 g/dL   RDW 48.1 (H) 85.6 - 31.4 %   Platelets 155 150 - 400 K/uL   nRBC 0.0 0.0 - 0.2 %   Assessment and Plan: Abdominal pain     - admit to inpt, tele     - check CT ab/pelvis w/o     - pain control, PPI     - lipase and LFTs ok  Hypokalemia     - replace K+; check Mg2+     - check renal fxn panel at 2200 to see progress  AKI     - fluids     - CT ab/pelvis pending; follow up on renal appearance with it     - watch nephrotoxins  Hyperglycemia     - no Hx of DM; check A1c  PAF SSS s/p PPM     - continue home regimen when confirmed  HTN     - hold diuretic/ARB d/t AKI; resume home regimen otherwise when confirmed  HLD     - continue home regimen when confirmed  Hypothyroidism     -  continue home regimen when confirmed  GERD     - PPI  Advance Care Planning:   Code Status: FULL  Consults: None  Family Communication: w/ son at bedside  Severity of Illness: The appropriate patient status for this patient is INPATIENT. Inpatient status is judged to be reasonable and necessary in order to provide the required intensity of service to ensure the patient's safety. The patient's presenting symptoms, physical exam findings, and initial radiographic and laboratory data in the context of their chronic comorbidities is felt to place them at high risk for further clinical deterioration. Furthermore, it is not anticipated that the patient will be medically stable for discharge from the hospital within 2 midnights of admission.   * I certify that at the point of admission it is my clinical judgment that the patient will require inpatient hospital care spanning beyond 2 midnights from the point of admission due to high intensity of service, high risk for further deterioration and high frequency of surveillance required.*  Author: Teddy Spike, DO 03/13/2022 4:57 PM  For on call review [email or web address].

## 2022-03-13 NOTE — ED Notes (Signed)
Date and time results received: 03/13/22 1601 (use smartphrase ".now" to insert current time)  Test: K+ Critical Value: 2.2  Name of Provider Notified: Dr. Theresia Lo  Orders Received? Or Actions Taken?: see chart

## 2022-03-14 ENCOUNTER — Inpatient Hospital Stay (HOSPITAL_COMMUNITY): Payer: Medicare Other

## 2022-03-14 ENCOUNTER — Other Ambulatory Visit (HOSPITAL_COMMUNITY): Payer: Self-pay

## 2022-03-14 DIAGNOSIS — R109 Unspecified abdominal pain: Secondary | ICD-10-CM | POA: Diagnosis not present

## 2022-03-14 LAB — COMPREHENSIVE METABOLIC PANEL
ALT: 34 U/L (ref 0–44)
AST: 46 U/L — ABNORMAL HIGH (ref 15–41)
Albumin: 3 g/dL — ABNORMAL LOW (ref 3.5–5.0)
Alkaline Phosphatase: 63 U/L (ref 38–126)
Anion gap: 6 (ref 5–15)
BUN: 15 mg/dL (ref 8–23)
CO2: 30 mmol/L (ref 22–32)
Calcium: 8.8 mg/dL — ABNORMAL LOW (ref 8.9–10.3)
Chloride: 103 mmol/L (ref 98–111)
Creatinine, Ser: 0.8 mg/dL (ref 0.44–1.00)
GFR, Estimated: >60 mL/min (ref >60)
Glucose, Bld: 87 mg/dL (ref 70–99)
Potassium: 3.3 mmol/L — ABNORMAL LOW (ref 3.5–5.1)
Sodium: 139 mmol/L (ref 135–145)
Total Bilirubin: 0.7 mg/dL (ref 0.3–1.2)
Total Protein: 5.8 g/dL — ABNORMAL LOW (ref 6.5–8.1)

## 2022-03-14 LAB — CBC
HCT: 33.3 % — ABNORMAL LOW (ref 36.0–46.0)
Hemoglobin: 10.8 g/dL — ABNORMAL LOW (ref 12.0–15.0)
MCH: 30.3 pg (ref 26.0–34.0)
MCHC: 32.4 g/dL (ref 30.0–36.0)
MCV: 93.5 fL (ref 80.0–100.0)
Platelets: 131 10*3/uL — ABNORMAL LOW (ref 150–400)
RBC: 3.56 MIL/uL — ABNORMAL LOW (ref 3.87–5.11)
RDW: 16.2 % — ABNORMAL HIGH (ref 11.5–15.5)
WBC: 4.8 10*3/uL (ref 4.0–10.5)
nRBC: 0 % (ref 0.0–0.2)

## 2022-03-14 MED ORDER — ROSUVASTATIN CALCIUM 20 MG PO TABS: 40.0000 mg | ORAL_TABLET | Freq: Every day | ORAL | Status: DC

## 2022-03-14 MED ORDER — APIXABAN 5 MG PO TABS: 5.0000 mg | ORAL_TABLET | Freq: Two times a day (BID) | ORAL | Status: DC

## 2022-03-14 MED ORDER — POTASSIUM CHLORIDE 10 MEQ/100ML IV SOLN
10.0000 meq | INTRAVENOUS | Status: AC
  Administered 2022-03-14 (×2): 10 meq via INTRAVENOUS
  Filled 2022-03-14: qty 100

## 2022-03-14 MED ORDER — APIXABAN 5 MG PO TABS
5.0000 mg | ORAL_TABLET | Freq: Two times a day (BID) | ORAL | Status: DC
  Administered 2022-03-14 – 2022-03-15 (×2): 5 mg via ORAL
  Filled 2022-03-14 (×2): qty 1

## 2022-03-14 MED ORDER — LEVOTHYROXINE SODIUM 50 MCG PO TABS
75.0000 ug | ORAL_TABLET | Freq: Every day | ORAL | Status: DC
  Filled 2022-03-14: qty 1

## 2022-03-14 MED ORDER — AMIODARONE HCL 200 MG PO TABS
100.0000 mg | ORAL_TABLET | Freq: Every day | ORAL | Status: DC
  Administered 2022-03-14 – 2022-03-15 (×2): 100 mg via ORAL
  Filled 2022-03-14 (×2): qty 1

## 2022-03-14 MED ORDER — ENSURE ENLIVE PO LIQD
237.0000 mL | Freq: Two times a day (BID) | ORAL | Status: DC
  Administered 2022-03-15 (×2): 237 mL via ORAL

## 2022-03-14 NOTE — TOC Progression Note (Addendum)
Transition of Care [facility]) - Progression Note    Patient Details  Name: Danielle Blanchard MRN: [id number] Date of Birth: 01-Dec-1940  [facility]) CM/SW Contact  Adrian Prows, RN Phone Number: [phone] 03/14/2022, 9:55 AM  Clinical Narrative:    Pt from [facility] [place]; LVM for Aram Beecham, Admissions at [facility] to verify level of care; awaiting return call; also attempted to contact pt's son Lenell Antu [phone]) to discuss d/c plans; unable to LVM; message says voice mail is not set up.  1545- contacted by Aram Beecham and she says the facility will be best source to obtain level of care; called facility and spoke w/ Martie Lee; spoke w/ Lawanna Kobus, Associate Professor and she says the pt is can long-term resident; will need FL2; TOC will follow.    Barriers to Discharge: Continued Medical Work up  Expected Discharge Plan and Services                                                 Social Determinants of Health (SDOH) Interventions Food Insecurity Interventions: Intervention Not Indicated Utilities Interventions: Intervention Not Indicated  Readmission Risk Interventions     No data to display

## 2022-03-14 NOTE — Progress Notes (Signed)
Mobility Specialist - Progress Note   03/14/22 0911  Mobility  Activity Ambulated with assistance in hallway  Level of Assistance Standby assist, set-up cues, supervision of patient - no hands on  Assistive Device Front wheel walker  Distance Ambulated (ft) 250 ft  Activity Response Tolerated well  Mobility Referral Yes  $Mobility charge 1 Mobility   Pt received in bed and agreeable to mobility. Pt stated she felt weak due to being NPO but was still eager to ambulate. C/o a dry mouth. Nurse notified. No other complaints during session. Pt to bed after session with all needs met & bed alarm turned back on.  During mobility: 81bpm HR  Set designer

## 2022-03-14 NOTE — Progress Notes (Addendum)
PROGRESS NOTE Danielle Blanchard  [id number] DOB: 11-26-1940 DOA: 03/13/2022 PCP: Danielle Meth, DO   Brief Narrative/Hospital Course:  81 y.o. female with medical history significant of dementia, SSS s/p PPM, HTN, HLD, PAF, chronic diastolic HF, hypothyroidism presented with abdominal pain unless chronic abdominal pain since her hiatal hernia repair but with increased epigastric and left upper quadrant abdominal pain past 2 days.  No nausea vomiting or diarrhea. Labs showed severe hypokalemia 2.2 creatinine 1.2 stable LFTs although mildly elevated AST fairly stable CBC, UA with pyuria 11-20 leukocytes trace nitrate negative. Patient is admitted CT abdomen pelvis without contrast ordered showed large hiatal hernia containing majority of the stomach as well as transverse colon small fat-containing left inguinal hernia no acute finding.  Previous CT abdomen in May/23 showed large hiatal hernia containing a portion of the stomach. -- Overnight afebrile BP stable labs shows potassium 3.3 improving from 2.7 fairly stable CBC W/ hb 10.8 gm, plt 131 lower from yesterday [facility] contacted this morning regarding abnormal CT finding    Subjective: Seen and examined this morning alert awake pleasant with mild baseline confusion/dementia Oriented to self month year but not to president  Overnight afebrile BP stable labs shows potassium 3.3 improving from 2.7 fairly stable CBC W/ hb 10.8 gm, plt 131 lower from yesterday [facility] contacted this morning regarding abnormal CT finding   Assessment and Plan:  Abdominal pain Large hiatal hernia containing majority of the stomach as well as mid transverse colon: Patient has had recurrent admission for abdominal pain.  Failure to thrive with reduction of hiatal hernia with G-tube placement by Dr. Hassell Done in December 2022 unable to repair hiatal hernia G-tube was removed per patient request.  CT abdomen showing now entire stomach and portion of transverse colon.   This morning alert awake abdomen feels soft.  Discussed with GI advised to consult surgery, seen by surgery hernia has been unable to be repaired and would not recommend any further attempts, plan to get upper GI for further evaluation if negative then recommend full liquid diet and monitoring tolerance  Hypokalemia: Replaced this morning.  Monitor. AKI: Improved with IV fluids. Recent Labs    06/03/21 1816 06/03/21 2020 06/04/21 0754 06/05/21 0444 06/06/21 0538 08/21/21 2307 10/17/21 1540 03/13/22 1424 03/13/22 2130 03/14/22 0545  BUN 19 18 13 8  6* 19 15 20 18 15   CREATININE 1.55* 1.34* 1.19* 1.02* 1.05* 1.03* 0.75 1.22* 0.83 0.80   Hyperglycemia monitor CBG.last cbg 106>87 A-fib/Sick sinus syndrome: PM in place.  Resume Eliquis once no obstruction Hypothyroidism: Continue her Synthroid Essential hypertension: Controlled.  Meds on hold Dyslipidemia: P.o. meds on hold GERD: Continue PPI  DVT prophylaxis:  Code Status:   Code Status: Full Code Family Communication: plan of care discussed with patient at bedside. Patient status is: Inpatient because of large lateral hernia symptomatic Level of care: Telemetry   Dispo: The patient is from: Medical group            Anticipated disposition: snf Objective: Vitals last 24 hrs: Vitals:   03/13/22 2019 03/14/22 0011 03/14/22 0300 03/14/22 0802  BP: 134/61 (!) 119/59 (!) 103/59 115/64  Pulse: 63 63 60 60  Resp: 18 16 16 17   Temp: 97.6 F (36.4 C) 98.1 F (36.7 C) 98.3 F (36.8 C) 98.3 F (36.8 C)  TempSrc: Oral Oral Oral Oral  SpO2: 98% 97% 96% 98%  Weight:      Height:       Weight change:   Physical Examination:  General exam: alert awake, older than stated age HEENT:Oral mucosa moist, Ear/Nose WNL grossly Respiratory system: bilaterally CLEAR BS, no use of accessory muscle Cardiovascular system: S1 & S2 +, No JVD. Gastrointestinal system: Abdomen soft,NT,ND, BS+ Nervous System:Alert, awake, oriented to self place  not to president, moving extremities. Extremities: LE edema NEG,distal peripheral pulses palpable.  Skin: No rashes,no icterus. MSK: Normal muscle bulk,tone, power  Medications reviewed:  Scheduled Meds:  pantoprazole (PROTONIX) IV  40 mg Intravenous Q24H   Continuous Infusions:  sodium chloride 100 mL/hr at 03/14/22 [id number]      Diet Order             Diet NPO time specified Except for: Sips with Meds, Ice Chips  Diet effective now                  Intake/Output Summary (Last 24 hours) at 03/14/2022 1343 Last data filed at 03/14/2022 0500 Gross per 24 hour  Intake 1065.66 ml  Output --  Net 1065.66 ml   Net IO Since Admission: 1,065.66 mL [03/14/22 1343]  Wt Readings from Last 3 Encounters:  03/13/22 60.1 kg  10/18/21 62 kg  08/21/21 64 kg     Unresulted Labs (From admission, onward)     Start     Ordered   03/13/22 2022  Hemoglobin A1c  Once,   R        03/13/22 2021          Data Reviewed: I have personally reviewed following labs and imaging studies CBC: Recent Labs  Lab 03/13/22 1424 03/14/22 0545  WBC 7.7 4.8  HGB 12.8 10.8*  HCT 38.8 33.3*  MCV 90.7 93.5  PLT 155 [id number]*   Basic Metabolic Panel: Recent Labs  Lab 03/13/22 1424 03/13/22 1817 03/13/22 2130 03/14/22 0545  NA 136  --  136 139  K 2.2*  --  2.7* 3.3*  CL 91*  --  97* 103  CO2 34*  --  30 30  GLUCOSE 193*  --  106* 87  BUN 20  --  18 15  CREATININE 1.22*  --  0.83 0.80  CALCIUM 9.3  --  8.7* 8.8*  MG  --  2.0  --   --   PHOS  --   --  2.8  --    GFR: Estimated Creatinine Clearance: 42.1 mL/min (by C-G formula based on SCr of 0.8 mg/dL). Liver Function Tests: Recent Labs  Lab 03/13/22 1424 03/13/22 2130 03/14/22 0545  AST 58*  --  46*  ALT 44  --  34  ALKPHOS 79  --  63  BILITOT 0.9  --  0.7  PROT 7.1  --  5.8*  ALBUMIN 3.6 3.2* 3.0*    No results found for this or any previous visit (from the past 240 hour(s)).  Antimicrobials: Anti-infectives (From admission,  onward)    None      Culture/Microbiology    Component Value Date/Time   SDES  06/03/2021 2212    URINE, CLEAN CATCH Performed at [facility], [place] [address], [place], [place] [address]    SPECREQUEST  06/03/2021 2212    NONE Performed at [facility], [place] [address], [place], [place] [address]    CULT MULTIPLE SPECIES PRESENT, SUGGEST RECOLLECTION (A) 06/03/2021 2212   REPTSTATUS 06/05/2021 FINAL 06/03/2021 2212  Other culture-see note  Radiology Studies: CT ABDOMEN PELVIS WO CONTRAST  Result Date: 03/13/2022 CLINICAL DATA:  Left-sided abdominal pain, hernia repair 2  months ago EXAM: CT ABDOMEN AND PELVIS WITHOUT CONTRAST TECHNIQUE: Multidetector CT imaging of the abdomen and pelvis was performed following the standard protocol without IV contrast. Unenhanced CT was performed per clinician order. Lack of IV contrast limits sensitivity and specificity, especially for evaluation of abdominal/pelvic solid viscera. RADIATION DOSE REDUCTION: This exam was performed according to the departmental dose-optimization program which includes automated exposure control, adjustment of the mA and/or kV according to patient size and/or use of iterative reconstruction technique. COMPARISON:  08/22/2021 FINDINGS: Lower chest: No acute pleural or parenchymal lung disease. A large hiatal hernia is again identified containing the majority of the stomach as well as a large segment of the transverse colon. Hepatobiliary: Cholecystectomy. Stable high attenuation of the liver parenchyma consistent with amiodarone administration per history. No focal liver abnormality. No biliary duct dilation. Pancreas: Unremarkable unenhanced appearance. Spleen: Unremarkable unenhanced appearance. Adrenals/Urinary Tract: No urinary tract calculi or obstructive uropathy. The adrenals are unremarkable. Bladder is decompressed, limiting its evaluation. Stomach/Bowel: No bowel obstruction or  ileus. No bowel wall thickening or inflammatory change. Large hiatal hernia as described above, containing the mid transverse colon as well as the majority of the stomach. Vascular/Lymphatic: Aortic atherosclerosis. No enlarged abdominal or pelvic lymph nodes. Reproductive: Uterus and bilateral adnexa are unremarkable. Other: No free fluid or free intraperitoneal gas. There is a small fat containing left inguinal hernia. No other ventral hernia. Musculoskeletal: No acute or destructive bony lesions. 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 IMPRESSION: 1. Large hiatal hernia containing the majority of the stomach as well as the mid transverse colon. 2. Small fat containing left inguinal hernia. 3. No acute intra-abdominal or intrapelvic process. 4.  Aortic Atherosclerosis (ICD10-I70.0). Electronically Signed   By: Sharlet Salina M.D.   On: 03/13/2022 18:09     LOS: 1 day   Lanae Boast, MD [facility]  03/14/2022, 1:43 PM

## 2022-03-14 NOTE — Consult Note (Signed)
Consult Note  Danielle Blanchard 1940/12/26  [id number].    Requesting MD: Dr. Lupita Leash Chief Complaint/Reason for Consult: hiatal hernia  HPI:  81 y.o. female with medical history significant for HTN, HLD, PAF, chronic diastolic HF, hypothyroidism, dementia, SSS s/p PPM who presented to [facility] ED from SNF with abdominal pain. Over the last 2-3 days she has had worsened epigastric/LUQ pain from baseline abdominal pain which has been present since prior [facility] repair.  CT shows large hiatal hernia containing the majority of the stomach as well as the mid transverse colon and general surgery asked to see. She is known to our service with history of stomach takedown with type III PEH and then tacked down with a PEG tube by Dr. Hassell Done 03/2021. PEG was removed in January 2023. She was seen by our service during hospitalization 05/2021 when she was having symptoms of early satiety - UGI at that time was negative.   At time of my visit she is alert and oriented to self and place but not oriented to situation or time. She is a poor historian and unable to relate the events leading up to admission yesterday and therefore history taken from chart review. She does deny nausea, emesis, abdominal pain at this time. She is unable to tell me when she last had a bowel movement.   ROS: Review of Systems  Unable to perform ROS: Dementia    History reviewed. No pertinent family history.  Past Medical History:  Diagnosis Date   High cholesterol    Hypertension     Past Surgical History:  Procedure Laterality Date   ESOPHAGOGASTRODUODENOSCOPY (EGD) WITH PROPOFOL N/A 02/13/2021   Procedure: ESOPHAGOGASTRODUODENOSCOPY (EGD) WITH PROPOFOL;  Surgeon: Arta Silence, MD;  Location: [facility];  Service: Gastroenterology;  Laterality: N/A;   PACEMAKER PLACEMENT     PEG PLACEMENT N/A 03/14/2021   Procedure: PERCUTANEOUS ENDOSCOPIC GASTROSTOMY (PEG) PLACEMENT;  Surgeon: Johnathan Hausen, MD;  Location: [facility];   Service: General;  Laterality: N/A;   XI ROBOTIC ASSISTED HIATAL HERNIA REPAIR N/A 03/14/2021   Procedure: XI ROBOTIC ASSISTED TAKEDOWN OF INCARCERATED HIATAL HERNIA;  Surgeon: Johnathan Hausen, MD;  Location: [facility];  Service: General;  Laterality: N/A;    Social History:  reports that she has never smoked. She has never used smokeless tobacco. She reports that she does not drink alcohol and does not use drugs.  Allergies: No Known Allergies  Medications Prior to Admission  Medication Sig Dispense Refill   acetaminophen (TYLENOL) 500 MG tablet Take 500 mg by mouth every 6 (six) hours as needed for moderate pain or headache.     amiodarone (PACERONE) 200 MG tablet Take 200 mg by mouth in the morning.     apixaban (ELIQUIS) 5 MG TABS tablet Take 5 mg by mouth in the morning and at bedtime.     carvedilol (COREG) 3.125 MG tablet Take 3.125 mg by mouth in the morning and at bedtime.     docusate sodium (COLACE) 100 MG capsule Take 1 capsule (100 mg total) by mouth 2 (two) times daily. (Patient taking differently: Take 100 mg by mouth daily as needed for mild constipation.) 10 capsule 0   feeding supplement (ENSURE ENLIVE / ENSURE PLUS) LIQD Take 237 mLs by mouth 4 (four) times daily. (Patient taking differently: Take 237 mLs by mouth daily.) 237 mL 12   hydrochlorothiazide (MICROZIDE) 12.5 MG capsule Take 12.5 mg by mouth in the morning.     lactulose (CHRONULAC) 10  GM/15ML solution Take 30 mLs (20 g total) by mouth daily. (Patient taking differently: Take 20 g by mouth daily as needed for moderate constipation.) 236 mL 0   levothyroxine (SYNTHROID) 88 MCG tablet Take 88 mcg by mouth daily before breakfast.     lidocaine (LIDODERM) 5 % Place 1 patch onto the skin daily. Remove & Discard patch within 12 hours or as directed by MD 30 patch 0   losartan (COZAAR) 50 MG tablet Take 50 mg by mouth in the morning and at bedtime.     Melatonin 10 MG CAPS Take 10 mg by mouth at bedtime.     pantoprazole  (PROTONIX) 40 MG tablet Take 1 tablet (40 mg total) by mouth daily. (Patient taking differently: Take 40 mg by mouth daily as needed (heartburn).) 30 tablet 0   Polyethyl Glycol-Propyl Glycol (SYSTANE) 0.4-0.3 % GEL ophthalmic gel Place 1 application into both eyes 3 (three) times daily as needed. (Patient taking differently: Place 1 application  into both eyes 3 (three) times daily as needed (for dry eyes).) 8 mL 0   potassium chloride SA (KLOR-CON M) 20 MEQ tablet Take 1 tablet (20 mEq total) by mouth 2 (two) times daily. 10 tablet 0   rosuvastatin (CRESTOR) 10 MG tablet Take 10 mg by mouth daily.     senna (SENOKOT) 8.6 MG TABS tablet Take 1 tablet (8.6 mg total) by mouth daily. (Patient taking differently: Take 1 tablet by mouth daily as needed for moderate constipation.) 120 tablet 0   traMADol-acetaminophen (ULTRACET) 37.5-325 MG tablet Take 1 tablet by mouth every 6 (six) hours as needed for severe pain.     traZODone (DESYREL) 50 MG tablet Take 50 mg by mouth at bedtime.      Blood pressure 115/64, pulse 60, temperature 98.3 F (36.8 C), temperature source Oral, resp. rate 17, height 5\' 2"  (1.575 m), weight 60.1 kg, SpO2 98 %. Physical Exam: General: pleasant, WD, female who is laying in bed in NAD HEENT: head is normocephalic, atraumatic.   Mouth is pink and moist Heart:  Palpable radial pulses bilaterally Lungs:  Respiratory effort nonlabored on room air Abd: soft, ND, +BS, mild TTP in epigastrium and LUQ MSK: all 4 extremities are symmetrical with no cyanosis, clubbing, or edema. Skin: warm and dry with no masses, lesions, or rashes Neuro: Cranial nerves 2-12 grossly intact, sensation is normal throughout Psych: Alert and oriented to self and place   Results for orders placed or performed during the hospital encounter of 03/13/22 (from the past 48 hour(s))  Lipase, blood     Status: None   Collection Time: 03/13/22  2:24 PM  Result Value Ref Range   Lipase 36 11 - 51 U/L     Comment: Performed at [facility], [place] [address], [place], [place] [address]  Comprehensive metabolic panel     Status: Abnormal   Collection Time: 03/13/22  2:24 PM  Result Value Ref Range   Sodium 136 135 - 145 mmol/L   Potassium 2.2 (LL) 3.5 - 5.1 mmol/L    Comment: CRITICAL RESULT CALLED TO, READ BACK BY AND VERIFIED WITH DOSS,M AT 1600 ON 03/13/22 BY LUZOLOP    Chloride 91 (L) 98 - 111 mmol/L   CO2 34 (H) 22 - 32 mmol/L   Glucose, Bld 193 (H) 70 - 99 mg/dL    Comment: Glucose reference range applies only to samples taken after fasting for at least 8 hours.   BUN 20 8 - 23  mg/dL   Creatinine, Ser 1.22 (H) 0.44 - 1.00 mg/dL   Calcium 9.3 8.9 - 10.3 mg/dL   Total Protein 7.1 6.5 - 8.1 g/dL   Albumin 3.6 3.5 - 5.0 g/dL   AST 58 (H) 15 - 41 U/L   ALT 44 0 - 44 U/L   Alkaline Phosphatase 79 38 - 126 U/L   Total Bilirubin 0.9 0.3 - 1.2 mg/dL   GFR, Estimated 44 (L) >60 mL/min    Comment: (NOTE) Calculated using the CKD-EPI Creatinine Equation (2021)    Anion gap 11 5 - 15    Comment: Performed at [facility], [place] [address], [place], [place] [address]  CBC     Status: Abnormal   Collection Time: 03/13/22  2:24 PM  Result Value Ref Range   WBC 7.7 4.0 - 10.5 K/uL   RBC 4.28 3.87 - 5.11 MIL/uL   Hemoglobin 12.8 12.0 - 15.0 g/dL   HCT 38.8 36.0 - 46.0 %   MCV 90.7 80.0 - 100.0 fL   MCH 29.9 26.0 - 34.0 pg   MCHC 33.0 30.0 - 36.0 g/dL   RDW 15.9 (H) 11.5 - 15.5 %   Platelets 155 150 - 400 K/uL   nRBC 0.0 0.0 - 0.2 %    Comment: Performed at [facility], [place] [address], [place], [place] [address]  Urinalysis, Routine w reflex microscopic     Status: Abnormal   Collection Time: 03/13/22  2:24 PM  Result Value Ref Range   Color, Urine YELLOW YELLOW   APPearance CLOUDY (A) CLEAR   Specific Gravity, Urine 1.023 1.005 - 1.030   pH 7.0 5.0 - 8.0   Glucose, UA NEGATIVE NEGATIVE mg/dL   Hgb urine dipstick SMALL (A)  NEGATIVE   Bilirubin Urine NEGATIVE NEGATIVE   Ketones, ur NEGATIVE NEGATIVE mg/dL   Protein, ur 100 (A) NEGATIVE mg/dL   Nitrite NEGATIVE NEGATIVE   Leukocytes,Ua TRACE (A) NEGATIVE   RBC / HPF 0-5 0 - 5 RBC/hpf   WBC, UA 11-20 0 - 5 WBC/hpf   Bacteria, UA RARE (A) NONE SEEN   Squamous Epithelial / LPF 0-5 0 - 5   Mucus PRESENT    Hyaline Casts, UA PRESENT    Granular Casts, UA PRESENT    Amorphous Crystal PRESENT     Comment: Performed at [facility], [place] [address], [place], [place] [address]  Magnesium     Status: None   Collection Time: 03/13/22  6:17 PM  Result Value Ref Range   Magnesium 2.0 1.7 - 2.4 mg/dL    Comment: Performed at [facility], [place] [address], [place], [place] [address]  Renal function panel     Status: Abnormal   Collection Time: 03/13/22  9:30 PM  Result Value Ref Range   Sodium 136 135 - 145 mmol/L   Potassium 2.7 (LL) 3.5 - 5.1 mmol/L    Comment: CRITICAL RESULT CALLED TO, READ BACK BY AND VERIFIED WITH ECKELKAMP,E RN @ 2233 ON [id number] BY MAHMOUD,S    Chloride 97 (L) 98 - 111 mmol/L   CO2 30 22 - 32 mmol/L   Glucose, Bld 106 (H) 70 - 99 mg/dL    Comment: Glucose reference range applies only to samples taken after fasting for at least 8 hours.   BUN 18 8 - 23 mg/dL   Creatinine, Ser 0.83 0.44 - 1.00 mg/dL   Calcium 8.7 (L) 8.9 - 10.3 mg/dL   Phosphorus  2.8 2.5 - 4.6 mg/dL   Albumin 3.2 (L) 3.5 - 5.0 g/dL   GFR, Estimated >60 >60 mL/min    Comment: (NOTE) Calculated using the CKD-EPI Creatinine Equation (2021)    Anion gap 9 5 - 15    Comment: Performed at [facility], [place] [address], [place], [place] [address]  Comprehensive metabolic panel     Status: Abnormal   Collection Time: 03/14/22  5:45 AM  Result Value Ref Range   Sodium 139 135 - 145 mmol/L   Potassium 3.3 (L) 3.5 - 5.1 mmol/L   Chloride 103 98 - 111 mmol/L   CO2 30 22 - 32 mmol/L   Glucose, Bld 87 70 - 99 mg/dL     Comment: Glucose reference range applies only to samples taken after fasting for at least 8 hours.   BUN 15 8 - 23 mg/dL   Creatinine, Ser 0.80 0.44 - 1.00 mg/dL   Calcium 8.8 (L) 8.9 - 10.3 mg/dL   Total Protein 5.8 (L) 6.5 - 8.1 g/dL   Albumin 3.0 (L) 3.5 - 5.0 g/dL   AST 46 (H) 15 - 41 U/L   ALT 34 0 - 44 U/L   Alkaline Phosphatase 63 38 - 126 U/L   Total Bilirubin 0.7 0.3 - 1.2 mg/dL   GFR, Estimated >60 >60 mL/min    Comment: (NOTE) Calculated using the CKD-EPI Creatinine Equation (2021)    Anion gap 6 5 - 15    Comment: Performed at [facility], [place] [address], [place], [place] [address]  CBC     Status: Abnormal   Collection Time: 03/14/22  5:45 AM  Result Value Ref Range   WBC 4.8 4.0 - 10.5 K/uL   RBC 3.56 (L) 3.87 - 5.11 MIL/uL   Hemoglobin 10.8 (L) 12.0 - 15.0 g/dL   HCT 33.3 (L) 36.0 - 46.0 %   MCV 93.5 80.0 - 100.0 fL   MCH 30.3 26.0 - 34.0 pg   MCHC 32.4 30.0 - 36.0 g/dL   RDW 16.2 (H) 11.5 - 15.5 %   Platelets 131 (L) 150 - 400 K/uL   nRBC 0.0 0.0 - 0.2 %    Comment: Performed at [facility], [place] [address] [place],  [address]   CT ABDOMEN PELVIS WO CONTRAST  Result Date: 03/13/2022 CLINICAL DATA:  Left-sided abdominal pain, hernia repair 2 months ago EXAM: CT ABDOMEN AND PELVIS WITHOUT CONTRAST TECHNIQUE: Multidetector CT imaging of the abdomen and pelvis was performed following the standard protocol without IV contrast. Unenhanced CT was performed per clinician order. Lack of IV contrast limits sensitivity and specificity, especially for evaluation of abdominal/pelvic solid viscera. RADIATION DOSE REDUCTION: This exam was performed according to the departmental dose-optimization program which includes automated exposure control, adjustment of the mA and/or kV according to patient size and/or use of iterative reconstruction technique. COMPARISON:  08/22/2021 FINDINGS: Lower chest: No acute pleural or parenchymal lung  disease. A large hiatal hernia is again identified containing the majority of the stomach as well as a large segment of the transverse colon. Hepatobiliary: Cholecystectomy. Stable high attenuation of the liver parenchyma consistent with amiodarone administration per history. No focal liver abnormality. No biliary duct dilation. Pancreas: Unremarkable unenhanced appearance. Spleen: Unremarkable unenhanced appearance. Adrenals/Urinary Tract: No urinary tract calculi or obstructive uropathy. The adrenals are unremarkable. Bladder is decompressed, limiting its evaluation. Stomach/Bowel: No bowel obstruction or ileus. No bowel wall thickening or inflammatory change. Large hiatal hernia as described  above, containing the mid transverse colon as well as the majority of the stomach. Vascular/Lymphatic: Aortic atherosclerosis. No enlarged abdominal or pelvic lymph nodes. Reproductive: Uterus and bilateral adnexa are unremarkable. Other: No free fluid or free intraperitoneal gas. There is a small fat containing left inguinal hernia. No other ventral hernia. Musculoskeletal: No acute or destructive bony lesions. 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 IMPRESSION: 1. Large hiatal hernia containing the majority of the stomach as well as the mid transverse colon. 2. Small fat containing left inguinal hernia. 3. No acute intra-abdominal or intrapelvic process. 4.  Aortic Atherosclerosis (ICD10-I70.0). Electronically Signed   By: Sharlet Salina M.D.   On: 03/13/2022 18:09      Assessment/Plan Abdominal pain s/p reduction of hiatal hernia with g-tube placement by Dr. Daphine Deutscher 03/14/21, g tube removed 04/2021 - hernia has been unable to be repaired and at this time would not recommend further attempts at repair here if surgery were indicated however at this time, though a poor historian, she does not appear ill or obstructed. No urgent/emergent surgical intervention indicated at this time. Will get  an upper GI for further evaluation - if upper GI negative then recommend starting full liquids and monitoring tolerance  FEN: NPO ID: none VTE: okay for chemical prophylaxis from surgical perspective  Per primary: Hypokalemia AKI PAF, SSS s/p PPM HTN HLD Hypothyroidism GERD  I reviewed hospitalist notes, last 24 h vitals and pain scores, last 48 h intake and output, last 24 h labs and trends, and last 24 h imaging results.   Eric Form, [facility] 03/14/2022, 9:24 AM Please see Amion for pager number during day hours 7:00am-4:30pm

## 2022-03-14 NOTE — Hospital Course (Addendum)
81 y.o. female with medical history significant of dementia, SSS s/p PPM, HTN, HLD, PAF, chronic diastolic HF, hypothyroidism presented with abdominal pain unless chronic abdominal pain since her hiatal hernia repair but with increased epigastric and left upper quadrant abdominal pain past 2 days.  No nausea vomiting or diarrhea. Labs showed severe hypokalemia 2.2 creatinine 1.2 stable LFTs although mildly elevated AST fairly stable CBC, UA with pyuria 11-20 leukocytes trace nitrate negative. Patient is admitted CT abdomen pelvis without contrast ordered showed large hiatal hernia containing majority of the stomach as well as transverse colon small fat-containing left inguinal hernia no acute finding.  Previous CT abdomen in May/23 showed large hiatal hernia containing a portion of the stomach.

## 2022-03-15 ENCOUNTER — Other Ambulatory Visit (HOSPITAL_COMMUNITY): Payer: Self-pay

## 2022-03-15 LAB — HEMOGLOBIN A1C
Hgb A1c MFr Bld: 5.7 % — ABNORMAL HIGH (ref 4.8–5.6)
Mean Plasma Glucose: 117 mg/dL

## 2022-03-15 LAB — CBC
HCT: 32 % — ABNORMAL LOW (ref 36.0–46.0)
Hemoglobin: 10.1 g/dL — ABNORMAL LOW (ref 12.0–15.0)
MCH: 29.6 pg (ref 26.0–34.0)
MCHC: 31.6 g/dL (ref 30.0–36.0)
MCV: 93.8 fL (ref 80.0–100.0)
Platelets: 129 10*3/uL — ABNORMAL LOW (ref 150–400)
RBC: 3.41 MIL/uL — ABNORMAL LOW (ref 3.87–5.11)
RDW: 16.5 % — ABNORMAL HIGH (ref 11.5–15.5)
WBC: 4.5 10*3/uL (ref 4.0–10.5)
nRBC: 0 % (ref 0.0–0.2)

## 2022-03-15 LAB — BASIC METABOLIC PANEL
Anion gap: 5 (ref 5–15)
BUN: 13 mg/dL (ref 8–23)
CO2: 27 mmol/L (ref 22–32)
Calcium: 8.6 mg/dL — ABNORMAL LOW (ref 8.9–10.3)
Chloride: 107 mmol/L (ref 98–111)
Creatinine, Ser: 0.75 mg/dL (ref 0.44–1.00)
GFR, Estimated: >60 mL/min (ref >60)
Glucose, Bld: 85 mg/dL (ref 70–99)
Potassium: 3.9 mmol/L (ref 3.5–5.1)
Sodium: 139 mmol/L (ref 135–145)

## 2022-03-15 MED ORDER — ENSURE ENLIVE PO LIQD: 237.0000 mL | Freq: Two times a day (BID) | ORAL | 12 refills | Status: AC

## 2022-03-15 MED ORDER — PANTOPRAZOLE SODIUM 40 MG PO TBEC
40.0000 mg | DELAYED_RELEASE_TABLET | Freq: Every day | ORAL | 0 refills | Status: AC
  Filled 2022-03-15: qty 30, 30d supply, fill #0

## 2022-03-15 NOTE — TOC Progression Note (Addendum)
Transition of Care [facility]) - Progression Note    Patient Details  Name: Danielle Blanchard MRN: [id number] Date of Birth: [birth year]  [facility] CM/SW Contact  Beckie Busing, RN Phone Number:[phone]  03/15/2022, 1:13 PM  Clinical Narrative:    [facility] acknowledges consult for SNF placement. Discharge orders have already been placed. CM to follow up to determine if patient is able to return to facility and what is needed for the return.   1332 CM attempted to contact admissions coordinator att Cheyenne Adas to disuc  patients return. There is no answer and voicemail has been left. Will await return call.   1336 Cm received return call from paula in admissions at [facility]. Per Gunnar Fusi patient is ok to return. CM will send d/c summary voa H[facility].      Barriers to Discharge: Continued Medical Work up  Expected Discharge Plan and Services           Expected Discharge Date: 03/15/22                                     Social Determinants of Health (SDOH) Interventions Food Insecurity Interventions: Intervention Not Indicated Utilities Interventions: Intervention Not Indicated  Readmission Risk Interventions     No data to display

## 2022-03-15 NOTE — Progress Notes (Signed)
Both RN and MD attempted to contact pt's son, Mellody Dance. Both unsucessful attempts. Unable to leave voicemail, as it has not been set up. Will continue trying to reach out regarding pt discharge.

## 2022-03-15 NOTE — Discharge Summary (Addendum)
Physician Discharge Summary  Euva Rundell TDV:[id number] DOB: 09/07/1940 DOA: 03/13/2022  PCP: Merrilyn Puma, DO  Admit date: 03/13/2022 Discharge date: 03/15/2022 Recommendations for Outpatient Follow-up:  Follow up with PCP in 1 weeks-call for appointment Follow-up with your surgery  in 1 wk Please obtain BMP/CBC in one week Follow-up with palliative care at the facility  Discharge Dispo: Skilled nursing facility Discharge Condition: Stable Code Status:   Code Status: Full Code Diet recommendation:  Diet Order             Diet full liquid Room service appropriate? Yes; Fluid consistency: Thin  Diet effective now                    Brief/Interim Summary:  81 y.o. female with medical history significant of dementia, SSS s/p PPM, HTN, HLD, PAF, chronic diastolic HF, hypothyroidism presented with abdominal pain unless chronic abdominal pain since her hiatal hernia repair but with increased epigastric and left upper quadrant abdominal pain past 2 days.  No nausea vomiting or diarrhea. Labs showed severe hypokalemia 2.2 creatinine 1.2 stable LFTs although mildly elevated AST fairly stable CBC, UA with pyuria 11-20 leukocytes trace nitrate negative. Patient is admitted CT abdomen pelvis without contrast ordered showed large hiatal hernia containing majority of the stomach as well as transverse colon small fat-containing left inguinal hernia no acute finding.  Previous CT abdomen in May/23 showed large hiatal hernia containing a portion of the stomach. Seen by surgery tolerating diet at this time no need for surgical intervention patient to continue on full liquid diet Ensure and advised to follow-up outpatient.  Discharge Diagnoses:  Principal Problem:   Abdominal pain Active Problems:   Hypokalemia   AKI (acute kidney injury) (HCC)   A-fib (HCC)   Sick sinus syndrome (HCC)   Hypothyroidism   Essential hypertension   Dyslipidemia   S/P placement of cardiac pacemaker    Gastroesophageal reflux disease without esophagitis  Abdominal pain Large hiatal hernia containing majority of the stomach as well as mid transverse colon: Patient has had recurrent admission for abdominal pain.  Failure to thrive with reduction of hiatal hernia with G-tube placement by Dr. Daphine Deutscher in December 2022 unable to repair hiatal hernia G-tube was removed per patient request.  CT abdomen showing now entire stomach and portion of transverse colon.  She is alert awake oriented no more abdominal pain.  Tolerating diet on full liquid diet, UGI was negative for obstruction > appreciate surgery input advised to continue full liquid diet/Ensure as a long-term no need for surgical intervention if she ever needed further surgery would likely need to be done with thoracic surgery as per general surgery recommendation.   Hypokalemia: Replaced and improved  AKI: Improved with IV fluids. Hyperglycemia monitor CBG.last cbg 106>87 A-fib/Sick sinus syndrome: PM in place.  Resume Eliquis once no obstruction Hypothyroidism: Continue her Synthroid Essential hypertension: Controlled.  Meds on hold Dyslipidemia: P.o. meds on hold GERD: Continue PPI  Goals of care/family discussion :currently full code, patient is already debilitated with failure to thrive previously.  At risk of recurrent admissions given her #1 problem. I explained to her son extensively he understands.  I reached out to general surgery to give him a call regarding her large hiatal hernia which is mostly a surgical problem. I informed him that I will try to get palliative care follow-up at the facility.  I provided general surgery number for him to follow-up in a week.  At this time patient is medically  stable.   Consults: General surgery Subjective: Alert awake, resting feel swell, tolerating diet. Mild constipation Feels ready for D/C home today   Discharge Exam: Vitals:   03/14/22 2002 03/15/22 0302  BP: (!) 147/67 (!) 147/66   Pulse: 65 67  Resp: 18 18  Temp: 98.4 F (36.9 C) 98 F (36.7 C)  SpO2: 99% 97%   General: Pt is alert, awake, not in acute distress Cardiovascular: RRR, S1/S2 +, no rubs, no gallops Respiratory: CTA bilaterally, no wheezing, no rhonchi Abdominal: Soft, NT, ND, bowel sounds + Extremities: no edema, no cyanosis  Discharge Instructions  Discharge Instructions     Discharge instructions   Complete by: As directed    Please continue full liquid diet and Ensure  Please call call MD or return to ER for similar or worsening recurring problem that brought you to hospital or if any fever,nausea/vomiting,abdominal pain, uncontrolled pain, chest pain,  shortness of breath or any other alarming symptoms.  Please follow-up your doctor as instructed in a week time and call the office for appointment.  Please avoid alcohol, smoking, or any other illicit substance and maintain healthy habits including taking your regular medications as prescribed.  You were cared for by a hospitalist during your hospital stay. If you have any questions about your discharge medications or the care you received while you were in the hospital after you are discharged, you can call the unit and ask to speak with the hospitalist on call if the hospitalist that took care of you is not available.  Once you are discharged, your primary care physician will handle any further medical issues. Please note that NO REFILLS for any discharge medications will be authorized once you are discharged, as it is imperative that you return to your primary care physician (or establish a relationship with a primary care physician if you do not have one) for your aftercare needs so that they can reassess your need for medications and monitor your lab values   Increase activity slowly   Complete by: As directed       Allergies as of 03/15/2022   No Known Allergies      Medication List     TAKE these medications    acetaminophen  500 MG tablet Commonly known as: TYLENOL Take 1,000 mg by mouth in the morning and at bedtime.   amiodarone 100 MG tablet Commonly known as: PACERONE Take 100 mg by mouth daily.   apixaban 5 MG Tabs tablet Commonly known as: ELIQUIS Take 5 mg by mouth in the morning and at bedtime.   bisacodyl 5 MG EC tablet Commonly known as: DULCOLAX Take 10 mg by mouth daily as needed for moderate constipation.   carvedilol 3.125 MG tablet Commonly known as: COREG Take 3.125 mg by mouth in the morning and at bedtime. Hold for SBP less than 100 or HR less than 60   docusate sodium 100 MG capsule Commonly known as: COLACE Take 1 capsule (100 mg total) by mouth 2 (two) times daily. What changed: when to take this   feeding supplement Liqd Take 237 mLs by mouth 2 (two) times daily between meals.   Fiber Adult Gummies 2 g Chew Chew 4 g by mouth daily.   FLEET ENEMA RE Place 1 Dose rectally once.   hydrochlorothiazide 12.5 MG tablet Commonly known as: HYDRODIURIL Take 12.5 mg by mouth daily.   levothyroxine 75 MCG tablet Commonly known as: SYNTHROID Take 75 mcg by mouth daily before breakfast.  lidocaine 4 % Place 1 patch onto the skin daily. Apply to lower back for back pain   Melatonin 10 MG Caps Take 10 mg by mouth at bedtime.   OVER THE COUNTER MEDICATION Take 600 mcg by mouth daily. Vitamin B 600 mcg   pantoprazole 40 MG tablet Commonly known as: Protonix Take 1 tablet (40 mg total) by mouth daily.   rosuvastatin 40 MG tablet Commonly known as: CRESTOR Take 40 mg by mouth at bedtime.   traMADol 50 MG tablet Commonly known as: ULTRAM Take 50 mg by mouth every 6 (six) hours as needed for moderate pain.   traZODone 50 MG tablet Commonly known as: DESYREL Take 50 mg by mouth at bedtime.   Vitamin D 50 MCG (2000 UT) Caps Take 4,000 Units by mouth daily.        Follow-up Information     Merrilyn Puma, DO Follow up in 1 week(s).   Contact information: [address] [place] [address] [phone]                No Known Allergies  The results of significant diagnostics from this hospitalization (including imaging, microbiology, ancillary and laboratory) are listed below for reference.    Microbiology: No results found for this or any previous visit (from the past 240 hour(s)).  Procedures/Studies: DG UGI W SINGLE CM (SOL OR THIN BA)  Result Date: 03/14/2022 CLINICAL DATA:  Left-sided abdominal pain. Previous surgical repair of hiatal hernia approximately 1 year ago. EXAM: UPPER GI SERIES WITH KUB TECHNIQUE: After obtaining a scout radiograph a single contrast upper GI series was performed using thin barium. Exam was technically difficult due to patient's frail physical condition. FLUOROSCOPY: Radiation Exposure Index (as provided by the fluoroscopic device): 60.0 mGy Kerma COMPARISON:  CT on 03/13/2022 FINDINGS: Scout radiograph:  Unremarkable bowel gas pattern. Esophagus:  No evidence of esophageal mass or stricture. Stomach: A large hiatal hernia is seen which contains nearly the entire stomach. Unopacified colon also seen within this hernia. There is no evidence gastric volvulus or obstruction. No masses or ulcers identified. Duodenum: Normal appearance of duodenal bulb and sweep. Normal position of ligament of Treitz. No ulcer, stricture, or other significant abnormality seen. Other:  None. IMPRESSION: Large hiatal hernia, which contains nearly the entire stomach and a portion of the colon. No evidence of gastric volvulus or obstruction. No evidence of esophageal stricture. Electronically Signed   By: Danae Orleans M.D.   On: 03/14/2022 15:12   CT ABDOMEN PELVIS WO CONTRAST  Result Date: 03/13/2022 CLINICAL DATA:  Left-sided abdominal pain, hernia repair 2 months ago EXAM: CT ABDOMEN AND PELVIS WITHOUT CONTRAST TECHNIQUE: Multidetector CT imaging of the abdomen and pelvis was performed following the standard protocol without IV  contrast. Unenhanced CT was performed per clinician order. Lack of IV contrast limits sensitivity and specificity, especially for evaluation of abdominal/pelvic solid viscera. RADIATION DOSE REDUCTION: This exam was performed according to the departmental dose-optimization program which includes automated exposure control, adjustment of the mA and/or kV according to patient size and/or use of iterative reconstruction technique. COMPARISON:  08/22/2021 FINDINGS: Lower chest: No acute pleural or parenchymal lung disease. A large hiatal hernia is again identified containing the majority of the stomach as well as a large segment of the transverse colon. Hepatobiliary: Cholecystectomy. Stable high attenuation of the liver parenchyma consistent with amiodarone administration per history. No focal liver abnormality. No biliary duct dilation. Pancreas: Unremarkable unenhanced appearance. Spleen: Unremarkable unenhanced appearance. Adrenals/Urinary Tract: No  urinary tract calculi or obstructive uropathy. The adrenals are unremarkable. Bladder is decompressed, limiting its evaluation. Stomach/Bowel: No bowel obstruction or ileus. No bowel wall thickening or inflammatory change. Large hiatal hernia as described above, containing the mid transverse colon as well as the majority of the stomach. Vascular/Lymphatic: Aortic atherosclerosis. No enlarged abdominal or pelvic lymph nodes. Reproductive: Uterus and bilateral adnexa are unremarkable. Other: No free fluid or free intraperitoneal gas. There is a small fat containing left inguinal hernia. No other ventral hernia. Musculoskeletal: No acute or destructive bony lesions. 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 IMPRESSION: 1. Large hiatal hernia containing the majority of the stomach as well as the mid transverse colon. 2. Small fat containing left inguinal hernia. 3. No acute intra-abdominal or intrapelvic process. 4.  Aortic Atherosclerosis  (ICD10-I70.0). Electronically Signed   By: Sharlet Salina M.D.   On: 03/13/2022 18:09    Labs: BNP (last 3 results) Recent Labs    06/03/21 1816  BNP 106.3*   Basic Metabolic Panel: Recent Labs  Lab 03/13/22 1424 03/13/22 1817 03/13/22 2130 03/14/22 0545 03/15/22 0618  NA 136  --  136 139 139  K 2.2*  --  2.7* 3.3* 3.9  CL 91*  --  97* 103 107  CO2 34*  --  30 30 27   GLUCOSE 193*  --  106* 87 85  BUN 20  --  18 15 13   CREATININE 1.22*  --  0.83 0.80 0.75  CALCIUM 9.3  --  8.7* 8.8* 8.6*  MG  --  2.0  --   --   --   PHOS  --   --  2.8  --   --    Liver Function Tests: Recent Labs  Lab 03/13/22 1424 03/13/22 2130 03/14/22 0545  AST 58*  --  46*  ALT 44  --  34  ALKPHOS 79  --  63  BILITOT 0.9  --  0.7  PROT 7.1  --  5.8*  ALBUMIN 3.6 3.2* 3.0*   Recent Labs  Lab 03/13/22 1424  LIPASE 36   No results for input(s): "AMMONIA" in the last 168 hours. CBC: Recent Labs  Lab 03/13/22 1424 03/14/22 0545 03/15/22 0618  WBC 7.7 4.8 4.5  HGB 12.8 10.8* 10.1*  HCT 38.8 33.3* 32.0*  MCV 90.7 93.5 93.8  PLT 155 131* 129*   Cardiac Enzymes: No results for input(s): "CKTOTAL", "CKMB", "CKMBINDEX", "TROPONINI" in the last 168 hours. BNP: Invalid input(s): "POCBNP" CBG: No results for input(s): "GLUCAP" in the last 168 hours. D-Dimer No results for input(s): "DDIMER" in the last 72 hours. Hgb A1c Recent Labs    03/13/22 2130  HGBA1C 5.7*   Lipid Profile No results for input(s): "CHOL", "HDL", "LDLCALC", "TRIG", "CHOLHDL", "LDLDIRECT" in the last 72 hours. Thyroid function studies No results for input(s): "TSH", "T4TOTAL", "T3FREE", "THYROIDAB" in the last 72 hours.  Invalid input(s): "FREET3" Anemia work up No results for input(s): "VITAMINB12", "FOLATE", "FERRITIN", "TIBC", "IRON", "RETICCTPCT" in the last 72 hours. Urinalysis    Component Value Date/Time   COLORURINE YELLOW 03/13/2022 1424   APPEARANCEUR CLOUDY (A) 03/13/2022 1424   LABSPEC 1.023  03/13/2022 1424   PHURINE 7.0 03/13/2022 1424   GLUCOSEU NEGATIVE 03/13/2022 1424   HGBUR SMALL (A) 03/13/2022 1424   BILIRUBINUR NEGATIVE 03/13/2022 1424   KETONESUR NEGATIVE 03/13/2022 1424   PROTEINUR 100 (A) 03/13/2022 1424   NITRITE NEGATIVE 03/13/2022 1424   LEUKOCYTESUR TRACE (A) 03/13/2022 1424   Sepsis Labs Recent  Labs  Lab 03/13/22 1424 03/14/22 0545 03/15/22 0618  WBC 7.7 4.8 4.5   Microbiology No results found for this or any previous visit (from the past 240 hour(s)). Time coordinating discharge: 25 minutes  SIGNED: Lanae Boast, MD  [facility] 03/15/2022, 11:40 AM  If 7PM-7AM, please contact night-coverage [email or web address]

## 2022-03-15 NOTE — Progress Notes (Addendum)
Progress Note     Subjective: Alert and oriented to self and place. No abdominal pain this am. No nausea or emesis. She says she is passing flatus but no bowel movement. She does not think she has had anything to eat yet last night or this am   Objective: Vital signs in last 24 hours: Temp:  [98 F (36.7 C)-98.4 F (36.9 C)] 98 F (36.7 C) (12/14 0302) Pulse Rate:  [60-67] 67 (12/14 0302) Resp:  [15-18] 18 (12/14 0302) BP: (115-147)/(64-72) 147/66 (12/14 0302) SpO2:  [97 %-99 %] 97 % (12/14 0302) Last BM Date :  (PTA)  Intake/Output from previous day: 12/13 0701 - 12/14 0700 In: 2602.2 [I.V.:2456.9; IV Piggyback:145.3] Out: -  Intake/Output this shift: No intake/output data recorded.  PE: General: pleasant, WD, female who is laying in bed in NAD Heart: regular, rate, and rhythm.  Normal s1,s2. No obvious murmurs, gallops, or rubs noted.  Palpable radial and pedal pulses bilaterally Abd: soft, ND, +BS, no tenderness to palpation MSK: all 4 extremities are symmetrical with no cyanosis, clubbing, or edema. Skin: warm and dry with no masses, lesions, or rashes Psych: Alert and baseline   Lab Results:  Recent Labs    03/14/22 0545 03/15/22 0618  WBC 4.8 4.5  HGB 10.8* 10.1*  HCT 33.3* 32.0*  PLT 131* 129*   BMET Recent Labs    03/14/22 0545 03/15/22 0618  NA 139 139  K 3.3* 3.9  CL 103 107  CO2 30 27  GLUCOSE 87 85  BUN 15 13  CREATININE 0.80 0.75  CALCIUM 8.8* 8.6*   PT/INR No results for input(s): "LABPROT", "INR" in the last 72 hours. CMP     Component Value Date/Time   NA 139 03/15/2022 0618   K 3.9 03/15/2022 0618   CL 107 03/15/2022 0618   CO2 27 03/15/2022 0618   GLUCOSE 85 03/15/2022 0618   BUN 13 03/15/2022 0618   CREATININE 0.75 03/15/2022 0618   CALCIUM 8.6 (L) 03/15/2022 0618   PROT 5.8 (L) 03/14/2022 0545   ALBUMIN 3.0 (L) 03/14/2022 0545   AST 46 (H) 03/14/2022 0545   ALT 34 03/14/2022 0545   ALKPHOS 63 03/14/2022 0545    BILITOT 0.7 03/14/2022 0545   GFRNONAA >60 03/15/2022 0618   GFRAA >60 12/05/2019 2254   Lipase     Component Value Date/Time   LIPASE 36 03/13/2022 1424       Studies/Results: DG UGI W SINGLE CM (SOL OR THIN BA)  Result Date: 03/14/2022 CLINICAL DATA:  Left-sided abdominal pain. Previous surgical repair of hiatal hernia approximately 1 year ago. EXAM: UPPER GI SERIES WITH KUB TECHNIQUE: After obtaining a scout radiograph a single contrast upper GI series was performed using thin barium. Exam was technically difficult due to patient's frail physical condition. FLUOROSCOPY: Radiation Exposure Index (as provided by the fluoroscopic device): 60.0 mGy Kerma COMPARISON:  CT on 03/13/2022 FINDINGS: Scout radiograph:  Unremarkable bowel gas pattern. Esophagus:  No evidence of esophageal mass or stricture. Stomach: A large hiatal hernia is seen which contains nearly the entire stomach. Unopacified colon also seen within this hernia. There is no evidence gastric volvulus or obstruction. No masses or ulcers identified. Duodenum: Normal appearance of duodenal bulb and sweep. Normal position of ligament of Treitz. No ulcer, stricture, or other significant abnormality seen. Other:  None. IMPRESSION: Large hiatal hernia, which contains nearly the entire stomach and a portion of the colon. No evidence of gastric volvulus or obstruction. No  evidence of esophageal stricture. Electronically Signed   By: Marlaine Hind M.D.   On: 03/14/2022 15:12   CT ABDOMEN PELVIS WO CONTRAST  Result Date: 03/13/2022 CLINICAL DATA:  Left-sided abdominal pain, hernia repair 2 months ago EXAM: CT ABDOMEN AND PELVIS WITHOUT CONTRAST TECHNIQUE: Multidetector CT imaging of the abdomen and pelvis was performed following the standard protocol without IV contrast. Unenhanced CT was performed per clinician order. Lack of IV contrast limits sensitivity and specificity, especially for evaluation of abdominal/pelvic solid viscera. RADIATION  DOSE REDUCTION: This exam was performed according to the departmental dose-optimization program which includes automated exposure control, adjustment of the mA and/or kV according to patient size and/or use of iterative reconstruction technique. COMPARISON:  08/22/2021 FINDINGS: Lower chest: No acute pleural or parenchymal lung disease. A large hiatal hernia is again identified containing the majority of the stomach as well as a large segment of the transverse colon. Hepatobiliary: Cholecystectomy. Stable high attenuation of the liver parenchyma consistent with amiodarone administration per history. No focal liver abnormality. No biliary duct dilation. Pancreas: Unremarkable unenhanced appearance. Spleen: Unremarkable unenhanced appearance. Adrenals/Urinary Tract: No urinary tract calculi or obstructive uropathy. The adrenals are unremarkable. Bladder is decompressed, limiting its evaluation. Stomach/Bowel: No bowel obstruction or ileus. No bowel wall thickening or inflammatory change. Large hiatal hernia as described above, containing the mid transverse colon as well as the majority of the stomach. Vascular/Lymphatic: Aortic atherosclerosis. No enlarged abdominal or pelvic lymph nodes. Reproductive: Uterus and bilateral adnexa are unremarkable. Other: No free fluid or free intraperitoneal gas. There is a small fat containing left inguinal hernia. No other ventral hernia. Musculoskeletal: No acute or destructive bony lesions. 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 IMPRESSION: 1. Large hiatal hernia containing the majority of the stomach as well as the mid transverse colon. 2. Small fat containing left inguinal hernia. 3. No acute intra-abdominal or intrapelvic process. 4.  Aortic Atherosclerosis (ICD10-I70.0). Electronically Signed   By: Randa Ngo M.D.   On: 03/13/2022 18:09    Anti-infectives: Anti-infectives (From admission, onward)    None         Assessment/Plan Abdominal pain Hiatal hernia s/p reduction of hiatal hernia with g-tube placement by Dr. Hassell Done 03/14/21, g tube removed 04/2021 - hernia has been unable to be repaired and at this time would not recommend further attempts at repair here and no urgent/emergent surgical intervention indicated at this time. UGI 12/13 is negative for volvulus or obstruction. Will get an upper GI for further evaluation - start FLD/ensures and monitor tolerance. Recommend she remain on this diet long term - stable for dc from surgical perspective if tolerating fulls   FEN: FLD, ensure ID: none VTE: eliquis   Per primary: Hypokalemia AKI PAF, SSS s/p PPM HTN HLD Hypothyroidism GERD  I reviewed hospitalist notes, last 24 h vitals and pain scores, last 48 h intake and output, last 24 h labs and trends, and last 24 h imaging results.    LOS: 2 days   [facility] 03/15/2022, 7:25 AM Please see Amion for pager number during day hours 7:00am-4:30pm

## 2022-03-15 NOTE — Plan of Care (Signed)
Patient transported off unit via transporters safely with all belongings at side.  PIV was removed.   Problem: Education: Goal: Knowledge of General Education information will improve Description: Including pain rating scale, medication(s)/side effects and non-pharmacologic comfort measures Outcome: Progressing   Problem: Health Behavior/Discharge Planning: Goal: Ability to manage health-related needs will improve Outcome: Progressing   Problem: Clinical Measurements: Goal: Ability to maintain clinical measurements within normal limits will improve Outcome: Progressing Goal: Will remain free from infection Outcome: Progressing Goal: Diagnostic test results will improve Outcome: Progressing Goal: Respiratory complications will improve Outcome: Progressing Goal: Cardiovascular complication will be avoided Outcome: Progressing   Problem: Activity: Goal: Risk for activity intolerance will decrease Outcome: Progressing   Problem: Nutrition: Goal: Adequate nutrition will be maintained Outcome: Progressing   Problem: Coping: Goal: Level of anxiety will decrease Outcome: Progressing   Problem: Elimination: Goal: Will not experience complications related to bowel motility Outcome: Progressing Goal: Will not experience complications related to urinary retention Outcome: Progressing   Problem: Pain Managment: Goal: General experience of comfort will improve Outcome: Progressing   Problem: Safety: Goal: Ability to remain free from injury will improve Outcome: Progressing   Problem: Skin Integrity: Goal: Risk for impaired skin integrity will decrease Outcome: Progressing

## 2022-03-15 NOTE — TOC Transition Note (Addendum)
Transition of Care [facility]) - CM/SW Discharge Note   Patient Details  Name: Danielle Blanchard MRN: [id number] Date of Birth: 05/19/1940  Transition of Care [facility]) CM/SW Contact:  Beckie Busing, RN Phone [phone]  03/15/2022, 1:47 PM   Clinical Narrative:    Patient discharging back to [facility]. Transportation arranged via [facility].  Please call report to  [facility]  [phone]   Final next level of care: Skilled Nursing Facility Barriers to Discharge: No Barriers Identified   Patient Goals and CMS Choice        Discharge Placement              Patient chooses bed at: [facility] (From [facility] long term) Patient to be transferred to facility by: [facility] Name of family member notified: Lenell Antu Patient and family notified of of transfer: 03/15/22  Discharge Plan and Services                DME Arranged: N/A DME Agency: NA       HH Arranged: NA HH Agency: NA        Social Determinants of Health (SDOH) Interventions Food Insecurity Interventions: Intervention Not Indicated Utilities Interventions: Intervention Not Indicated   Readmission Risk Interventions     No data to display

## 2022-03-15 NOTE — Care Management (Signed)
Pt transported off unit safely with all belongings at side via transporters. PIV removed.

## 2022-03-15 NOTE — Progress Notes (Signed)
Report called to [facility] RN at [facility].

## 2022-03-16 ENCOUNTER — Other Ambulatory Visit (HOSPITAL_COMMUNITY): Payer: Self-pay

## 2022-03-17 ENCOUNTER — Encounter (HOSPITAL_COMMUNITY): Payer: Self-pay

## 2022-03-17 ENCOUNTER — Emergency Department (HOSPITAL_COMMUNITY)
Admission: EM | Admit: 2022-03-17 | Discharge: 2022-03-18 | Disposition: A | Discharge status: Skilled Nursing Facility | Payer: Medicare Other | Attending: Emergency Medicine | Admitting: Emergency Medicine

## 2022-03-17 DIAGNOSIS — F0390 Unspecified dementia without behavioral disturbance: Secondary | ICD-10-CM | POA: Insufficient documentation

## 2022-03-17 DIAGNOSIS — I110 Hypertensive heart disease with heart failure: Secondary | ICD-10-CM | POA: Insufficient documentation

## 2022-03-17 DIAGNOSIS — I509 Heart failure, unspecified: Secondary | ICD-10-CM | POA: Insufficient documentation

## 2022-03-17 DIAGNOSIS — M549 Dorsalgia, unspecified: Secondary | ICD-10-CM | POA: Insufficient documentation

## 2022-03-17 DIAGNOSIS — Z1152 Encounter for screening for COVID-19: Secondary | ICD-10-CM | POA: Insufficient documentation

## 2022-03-17 DIAGNOSIS — Z79899 Other long term (current) drug therapy: Secondary | ICD-10-CM | POA: Insufficient documentation

## 2022-03-17 DIAGNOSIS — Z7901 Long term (current) use of anticoagulants: Secondary | ICD-10-CM | POA: Insufficient documentation

## 2022-03-17 DIAGNOSIS — R197 Diarrhea, unspecified: Secondary | ICD-10-CM

## 2022-03-17 LAB — CBC WITH DIFFERENTIAL/PLATELET
Abs Immature Granulocytes: 0.02 10*3/uL (ref 0.00–0.07)
Basophils Absolute: 0 10*3/uL (ref 0.0–0.1)
Basophils Relative: 1 %
Eosinophils Absolute: 0 10*3/uL (ref 0.0–0.5)
Eosinophils Relative: 0 %
HCT: 33.5 % — ABNORMAL LOW (ref 36.0–46.0)
Hemoglobin: 11 g/dL — ABNORMAL LOW (ref 12.0–15.0)
Immature Granulocytes: 0 %
Lymphocytes Relative: 12 %
Lymphs Abs: 1 10*3/uL (ref 0.7–4.0)
MCH: 30.4 pg (ref 26.0–34.0)
MCHC: 32.8 g/dL (ref 30.0–36.0)
MCV: 92.5 fL (ref 80.0–100.0)
Monocytes Absolute: 0.6 10*3/uL (ref 0.1–1.0)
Monocytes Relative: 7 %
Neutro Abs: 6.7 10*3/uL (ref 1.7–7.7)
Neutrophils Relative %: 80 %
Platelets: 142 10*3/uL — ABNORMAL LOW (ref 150–400)
RBC: 3.62 MIL/uL — ABNORMAL LOW (ref 3.87–5.11)
RDW: 17 % — ABNORMAL HIGH (ref 11.5–15.5)
WBC: 8.4 10*3/uL (ref 4.0–10.5)
nRBC: 0 % (ref 0.0–0.2)

## 2022-03-17 LAB — COMPREHENSIVE METABOLIC PANEL
ALT: 36 U/L (ref 0–44)
AST: 44 U/L — ABNORMAL HIGH (ref 15–41)
Albumin: 3.7 g/dL (ref 3.5–5.0)
Alkaline Phosphatase: 73 U/L (ref 38–126)
Anion gap: 7 (ref 5–15)
BUN: 19 mg/dL (ref 8–23)
CO2: 25 mmol/L (ref 22–32)
Calcium: 9 mg/dL (ref 8.9–10.3)
Chloride: 103 mmol/L (ref 98–111)
Creatinine, Ser: 0.77 mg/dL (ref 0.44–1.00)
GFR, Estimated: >60 mL/min (ref >60)
Glucose, Bld: 107 mg/dL — ABNORMAL HIGH (ref 70–99)
Potassium: 3.3 mmol/L — ABNORMAL LOW (ref 3.5–5.1)
Sodium: 135 mmol/L (ref 135–145)
Total Bilirubin: 0.8 mg/dL (ref 0.3–1.2)
Total Protein: 7.1 g/dL (ref 6.5–8.1)

## 2022-03-17 LAB — CBG MONITORING, ED: Glucose-Capillary: 109 mg/dL — ABNORMAL HIGH (ref 70–99)

## 2022-03-17 LAB — LIPASE, BLOOD: Lipase: 29 U/L (ref 11–51)

## 2022-03-17 NOTE — ED Provider Triage Note (Signed)
Emergency Medicine Provider Triage Evaluation Note  Danielle Blanchard , a 81 y.o. female  was evaluated in triage.  Pt complains of lower back pain. Coming from [facility], called 911 herself to say her back was hurting. States she didn't fall. Hx of dementia.   Review of Systems  Positive: Low back pain Negative:   Physical Exam  BP 102/87 (BP Location: Left Arm)   Pulse 72   Temp 98 F (36.7 C) (Oral)   Resp 18   SpO2 99%  Gen:   Awake, no distress   Resp:  Normal effort  MSK:   Moves extremities without difficulty  Other:  Ambulating with some assistance, seems slightly confused  Medical Decision Making  Medically screening exam initiated at 6:36 PM.  Appropriate orders placed.  Danielle Blanchard was informed that the remainder of the evaluation will be completed by another provider, this initial triage assessment does not replace that evaluation, and the importance of remaining in the ED until their evaluation is complete.     Danielle Blanchard, Cordelia Poche 03/17/22 1837

## 2022-03-17 NOTE — ED Triage Notes (Signed)
Pt arrived via EMS, from [facility], c/o lumbar back pain. Able to ambulate. Hx of dementia.

## 2022-03-17 NOTE — ED Notes (Addendum)
Lenell Antu (son) would like the provider to call him with an update on the patient when able. PA Lorin advised.

## 2022-03-17 NOTE — ED Provider Notes (Signed)
[facility] DEPT Provider Note   CSN: [id number] Arrival date & time: 03/17/22  1818     History  Chief Complaint  Patient presents with   Back Pain    Britini Minjares is a 81 y.o. female. With past medical history of hypertension, high cholesterol, dementia, CHF, sick sinus syndrome, atrial fibrillation who presents to the emergency department with back pain.  Level 5 caveat: dementia   Spoke with [place], [place]: she states the patient was recently discharged and back to the facility 2 days ago. She was discharged on a clear liquid diet. She states that the patient had complained to the night shift nurse that she was not able to go to the bathroom. She states she was given a medication and has begun to have diarrhea. She also complained of being nauseated and vomiting however there has been no witnessed emesis.    Chart review, was admitted 03/13/22-03/15/22. Was admitted for abdominal pain. Had AKI. Has known large hiatal hernia which was containing majority of stomach and transverse colon. She was seen by surgery. Was tolerating clear liquids and discharged with outpatient follow-up.    Back Pain      Home Medications Prior to Admission medications   Medication Sig Start Date End Date Taking? Authorizing Provider  acetaminophen (TYLENOL) 500 MG tablet Take 1,000 mg by mouth in the morning and at bedtime.    [provider]  amiodarone (PACERONE) 100 MG tablet Take 100 mg by mouth daily.    [provider]  apixaban (ELIQUIS) 5 MG TABS tablet Take 5 mg by mouth in the morning and at bedtime.    [provider]  bisacodyl (DULCOLAX) 5 MG EC tablet Take 10 mg by mouth daily as needed for moderate constipation.    [provider]  carvedilol (COREG) 3.125 MG tablet Take 3.125 mg by mouth in the morning and at bedtime. Hold for SBP less than 100 or HR less than 60    [provider]  Cholecalciferol  (VITAMIN D) 50 MCG (2000 UT) CAPS Take 4,000 Units by mouth daily.    [provider]  docusate sodium (COLACE) 100 MG capsule Take 1 capsule (100 mg total) by mouth 2 (two) times daily. Patient taking differently: Take 100 mg by mouth daily. 03/21/21   Regalado, Belkys A, MD  feeding supplement (ENSURE ENLIVE / ENSURE PLUS) LIQD Take 237 mLs by mouth 2 (two) times daily between meals. 03/15/22 04/14/22  Antonieta Pert, MD  Fiber Adult Gummies 2 g CHEW Chew 4 g by mouth daily.    [provider]  hydrochlorothiazide (HYDRODIURIL) 12.5 MG tablet Take 12.5 mg by mouth daily.    [provider]  levothyroxine (SYNTHROID) 75 MCG tablet Take 75 mcg by mouth daily before breakfast.    [provider]  lidocaine 4 % Place 1 patch onto the skin daily. Apply to lower back for back pain    [provider]  Melatonin 10 MG CAPS Take 10 mg by mouth at bedtime.    [provider]  OVER THE COUNTER MEDICATION Take 600 mcg by mouth daily. Vitamin B 600 mcg    [provider]  pantoprazole (PROTONIX) 40 MG tablet Take 1 tablet (40 mg total) by mouth daily. 03/15/22 04/14/22  Antonieta Pert, MD  rosuvastatin (CRESTOR) 40 MG tablet Take 40 mg by mouth at bedtime.    [provider]  Sodium Phosphates (FLEET ENEMA RE) Place 1 Dose rectally  once.    [provider]  traMADol (ULTRAM) 50 MG tablet Take 50 mg by mouth every 6 (six) hours as needed for moderate pain.    [provider]  traZODone (DESYREL) 50 MG tablet Take 50 mg by mouth at bedtime.    [provider]      Allergies    Patient has no known allergies.    Review of Systems   Review of Systems  Unable to perform ROS: Dementia  Gastrointestinal:  Positive for diarrhea, nausea and vomiting.    Physical Exam Updated Vital Signs BP 102/87 (BP Location: Left Arm)   Pulse 72   Temp 98 F (36.7 C) (Oral)   Resp 18   SpO2 99%  Physical Exam Vitals and nursing  note reviewed.  Constitutional:      General: She is not in acute distress.    Appearance: Normal appearance. She is not ill-appearing or toxic-appearing.  HENT:     Head: Normocephalic and atraumatic.     Mouth/Throat:     Mouth: Mucous membranes are moist.     Pharynx: Oropharynx is clear.  Eyes:     General: No scleral icterus.    Extraocular Movements: Extraocular movements intact.  Cardiovascular:     Rate and Rhythm: Normal rate.     Pulses: Normal pulses.     Heart sounds: Normal heart sounds.  Pulmonary:     Effort: Pulmonary effort is normal. No respiratory distress.  Abdominal:     General: Bowel sounds are normal. There is no distension.     Palpations: Abdomen is soft.     Tenderness: There is abdominal tenderness.  Musculoskeletal:        General: Normal range of motion.     Cervical back: Neck supple.  Skin:    General: Skin is warm and dry.     Capillary Refill: Capillary refill takes less than 2 seconds.     Findings: No rash.  Neurological:     General: No focal deficit present.     Mental Status: She is alert. Mental status is at baseline.  Psychiatric:        Mood and Affect: Mood normal.        Behavior: Behavior normal.        Thought Content: Thought content normal.        Judgment: Judgment normal.     ED Results / Procedures / Treatments   Labs (all labs ordered are listed, but only abnormal results are displayed) Labs Reviewed  CBC WITH DIFFERENTIAL/PLATELET - Abnormal; Notable for the following components:      Result Value   RBC 3.62 (*)    Hemoglobin 11.0 (*)    HCT 33.5 (*)    RDW 17.0 (*)    Platelets 142 (*)    All other components within normal limits  URINALYSIS, ROUTINE W REFLEX MICROSCOPIC  COMPREHENSIVE METABOLIC PANEL  LIPASE, BLOOD  CBG MONITORING, ED   EKG None  Radiology No results found.  Procedures Procedures   Medications Ordered in ED Medications - No data to display  ED Course/ Medical Decision Making/  A&P                           Medical Decision Making Amount and/or Complexity of Data Reviewed Labs: ordered.  Care of patient is being handed off to [employer], PA-C at this time.  Patient is pending workup.  If her labs  are unremarkable, feel that she is likely safe to be discharged back to her facility.  Final disposition pending completed workup.  Please see her note for continuation of care Final Clinical Impression(s) / ED Diagnoses Final diagnoses:  None    Rx / DC Orders ED Discharge Orders     None         Mickie Hillier, PA-C 03/17/22 2241    [name], MD 03/17/22 2332

## 2022-03-17 NOTE — ED Notes (Signed)
CBG done 109

## 2022-03-17 NOTE — ED Provider Notes (Signed)
  Physical Exam  BP 102/87 (BP Location: Left Arm)   Pulse 72   Temp 98 F (36.7 C) (Oral)   Resp 18   SpO2 99%   Physical Exam  Procedures  Procedures  ED Course / MDM    Medical Decision Making Amount and/or Complexity of Data Reviewed Labs: ordered.   ***  Care of this patient assumed from preceding ED provider Mertha Baars, PA-C at time of shift change. Please see her associated note for further insight and the patient's ED course.  In patient is a 81 year old female presenting from [facility] with concern for diarrhea.  Reportedly was previously constipated, administered laxative at facility.  Reported recent admission with findings of large hiatal hernia containing stomach and transverse colon, seen by surgery and discharged as she was tolerating clear liquid diet.  At time of shift change pending laboratory studies and p.o. challenge.

## 2022-03-18 LAB — URINALYSIS, ROUTINE W REFLEX MICROSCOPIC
Bacteria, UA: NONE SEEN
Bilirubin Urine: NEGATIVE
Glucose, UA: NEGATIVE mg/dL
Ketones, ur: NEGATIVE mg/dL
Nitrite: NEGATIVE
Protein, ur: NEGATIVE mg/dL
Specific Gravity, Urine: 1.01 (ref 1.005–1.030)
pH: 6 (ref 5.0–8.0)

## 2022-03-18 LAB — RESP PANEL BY RT-PCR (RSV, FLU A&B, COVID)  RVPGX2
Influenza A by PCR: NEGATIVE
Influenza B by PCR: NEGATIVE
Resp Syncytial Virus by PCR: NEGATIVE
SARS Coronavirus 2 by RT PCR: NEGATIVE

## 2022-03-18 MED ORDER — POTASSIUM CHLORIDE CRYS ER 20 MEQ PO TBCR
40.0000 meq | EXTENDED_RELEASE_TABLET | Freq: Once | ORAL | Status: AC
  Administered 2022-03-18: 40 meq via ORAL
  Filled 2022-03-18: qty 2

## 2022-03-18 NOTE — ED Notes (Signed)
[facility] called for transport.  

## 2022-03-18 NOTE — Discharge Instructions (Addendum)
Danielle Blanchard was seen in the ER today for her loose stool.  Her laboratory studies were reassuring.  Suspect that her loose stools and response to the stool softeners to have been administered at her facility.  You may continue these as needed for her constipation.  Follow-up with her primary care doctor and return to the ER with any severe symptoms.

## 2022-03-18 NOTE — ED Notes (Signed)
Taken pt to the restroom twice no urine just bm, put a purwick on

## 2022-03-27 ENCOUNTER — Other Ambulatory Visit (HOSPITAL_COMMUNITY): Payer: Self-pay

## 2022-06-05 ENCOUNTER — Other Ambulatory Visit (HOSPITAL_COMMUNITY): Payer: Self-pay

## 2023-11-03 ENCOUNTER — Emergency Department (HOSPITAL_COMMUNITY)
Admission: EM | Admit: 2023-11-03 | Discharge: 2023-11-04 | Disposition: A | Discharge status: Home / Self Care | Source: Skilled Nursing Facility | Attending: Emergency Medicine | Admitting: Emergency Medicine

## 2023-11-03 ENCOUNTER — Emergency Department (HOSPITAL_COMMUNITY)

## 2023-11-03 ENCOUNTER — Other Ambulatory Visit: Payer: Self-pay

## 2023-11-03 ENCOUNTER — Encounter (HOSPITAL_COMMUNITY): Payer: Self-pay

## 2023-11-03 DIAGNOSIS — K449 Diaphragmatic hernia without obstruction or gangrene: Secondary | ICD-10-CM | POA: Insufficient documentation

## 2023-11-03 DIAGNOSIS — E876 Hypokalemia: Secondary | ICD-10-CM | POA: Insufficient documentation

## 2023-11-03 DIAGNOSIS — R1012 Left upper quadrant pain: Secondary | ICD-10-CM | POA: Diagnosis present

## 2023-11-03 DIAGNOSIS — Z7901 Long term (current) use of anticoagulants: Secondary | ICD-10-CM | POA: Diagnosis not present

## 2023-11-03 LAB — URINALYSIS, W/ REFLEX TO CULTURE (INFECTION SUSPECTED)
Bacteria, UA: NONE SEEN
Bilirubin Urine: NEGATIVE
Glucose, UA: NEGATIVE mg/dL
Hgb urine dipstick: NEGATIVE
Ketones, ur: NEGATIVE mg/dL
Nitrite: NEGATIVE
Protein, ur: NEGATIVE mg/dL
Specific Gravity, Urine: 1.041 — ABNORMAL HIGH (ref 1.005–1.030)
pH: 6 (ref 5.0–8.0)

## 2023-11-03 LAB — COMPREHENSIVE METABOLIC PANEL WITH GFR
ALT: 11 U/L (ref 0–44)
AST: 18 U/L (ref 15–41)
Albumin: 3.5 g/dL (ref 3.5–5.0)
Alkaline Phosphatase: 74 U/L (ref 38–126)
Anion gap: 12 (ref 5–15)
BUN: 16 mg/dL (ref 8–23)
CO2: 27 mmol/L (ref 22–32)
Calcium: 8.7 mg/dL — ABNORMAL LOW (ref 8.9–10.3)
Chloride: 96 mmol/L — ABNORMAL LOW (ref 98–111)
Creatinine, Ser: 0.83 mg/dL (ref 0.44–1.00)
GFR, Estimated: >60 mL/min (ref >60)
Glucose, Bld: 118 mg/dL — ABNORMAL HIGH (ref 70–99)
Potassium: 3 mmol/L — ABNORMAL LOW (ref 3.5–5.1)
Sodium: 135 mmol/L (ref 135–145)
Total Bilirubin: 0.5 mg/dL (ref 0.0–1.2)
Total Protein: 6.6 g/dL (ref 6.5–8.1)

## 2023-11-03 LAB — CBC WITH DIFFERENTIAL/PLATELET
Abs Immature Granulocytes: 0.01 K/uL (ref 0.00–0.07)
Basophils Absolute: 0 K/uL (ref 0.0–0.1)
Basophils Relative: 1 %
Eosinophils Absolute: 0.2 K/uL (ref 0.0–0.5)
Eosinophils Relative: 4 %
HCT: 35.6 % — ABNORMAL LOW (ref 36.0–46.0)
Hemoglobin: 10.9 g/dL — ABNORMAL LOW (ref 12.0–15.0)
Immature Granulocytes: 0 %
Lymphocytes Relative: 21 %
Lymphs Abs: 0.9 K/uL (ref 0.7–4.0)
MCH: 29.1 pg (ref 26.0–34.0)
MCHC: 30.6 g/dL (ref 30.0–36.0)
MCV: 94.9 fL (ref 80.0–100.0)
Monocytes Absolute: 0.4 K/uL (ref 0.1–1.0)
Monocytes Relative: 10 %
Neutro Abs: 2.8 K/uL (ref 1.7–7.7)
Neutrophils Relative %: 64 %
Platelets: 135 K/uL — ABNORMAL LOW (ref 150–400)
RBC: 3.75 MIL/uL — ABNORMAL LOW (ref 3.87–5.11)
RDW: 13.9 % (ref 11.5–15.5)
WBC: 4.4 K/uL (ref 4.0–10.5)
nRBC: 0 % (ref 0.0–0.2)

## 2023-11-03 LAB — TROPONIN I (HIGH SENSITIVITY)
Troponin I (High Sensitivity): 5 ng/L (ref <18)
Troponin I (High Sensitivity): 4 ng/L (ref <18)

## 2023-11-03 LAB — LIPASE, BLOOD: Lipase: 31 U/L (ref 11–51)

## 2023-11-03 MED ORDER — TRAMADOL HCL 50 MG PO TABS
50.0000 mg | ORAL_TABLET | Freq: Four times a day (QID) | ORAL | 0 refills | Status: AC | PRN
  Filled 2023-11-03: qty 12, 3d supply, fill #0

## 2023-11-03 MED ORDER — ONDANSETRON HCL 4 MG/2ML IJ SOLN
4.0000 mg | Freq: Once | INTRAMUSCULAR | Status: AC
  Administered 2023-11-03: 4 mg via INTRAVENOUS
  Filled 2023-11-03: qty 2

## 2023-11-03 MED ORDER — POTASSIUM CHLORIDE 10 MEQ/100ML IV SOLN
10.0000 meq | Freq: Once | INTRAVENOUS | Status: AC
  Administered 2023-11-03: 10 meq via INTRAVENOUS
  Filled 2023-11-03: qty 100

## 2023-11-03 MED ORDER — IOHEXOL 300 MG/ML  SOLN
100.0000 mL | Freq: Once | INTRAMUSCULAR | Status: AC | PRN
  Administered 2023-11-03: 100 mL via INTRAVENOUS

## 2023-11-03 MED ORDER — FENTANYL CITRATE PF 50 MCG/ML IJ SOSY
50.0000 ug | PREFILLED_SYRINGE | Freq: Once | INTRAMUSCULAR | Status: AC
  Administered 2023-11-03: 50 ug via INTRAVENOUS
  Filled 2023-11-03: qty 1

## 2023-11-03 MED ORDER — POTASSIUM CHLORIDE ER 20 MEQ PO TBCR
20.0000 meq | EXTENDED_RELEASE_TABLET | Freq: Every day | ORAL | 0 refills | Status: AC
  Filled 2023-11-03: qty 5, 5d supply, fill #0

## 2023-11-03 NOTE — ED Provider Notes (Signed)
 [place] EMERGENCY DEPARTMENT AT [facility] Provider Note   CSN: [id number] Arrival date & time: 11/03/23  1753     Patient presents with: Abdominal Pain   Danielle Blanchard is a 83 y.o. female history of hypertension, high cholesterol, A-fib on Eliquis , here presenting with abdominal pain.  Patient states that today she woke up and has left upper quadrant pain.  She told nursing home she has breast pain or chest pain but she denies it to me.  She states that she had a bowel movement yesterday.  Denies any nausea or vomiting.  Denies any fevers or urinary symptoms.   The history is provided by the patient.       Prior to Admission medications   Medication Sig Start Date End Date Taking? Authorizing Provider  acetaminophen  (TYLENOL ) 500 MG tablet Take 1,000 mg by mouth in the morning and at bedtime.    [provider]  amiodarone  (PACERONE ) 100 MG tablet Take 100 mg by mouth daily.    [provider]  apixaban  (ELIQUIS ) 5 MG TABS tablet Take 5 mg by mouth in the morning and at bedtime.    [provider]  bisacodyl  (DULCOLAX) 5 MG EC tablet Take 10 mg by mouth daily as needed for moderate constipation.    [provider]  carvedilol  (COREG ) 3.125 MG tablet Take 3.125 mg by mouth in the morning and at bedtime. Hold for SBP less than 100 or HR less than 60    [provider]  Cholecalciferol (VITAMIN D) 50 MCG (2000 UT) CAPS Take 4,000 Units by mouth daily.    [provider]  docusate sodium  (COLACE) 100 MG capsule Take 1 capsule (100 mg total) by mouth 2 (two) times daily. Patient taking differently: Take 100 mg by mouth daily. 03/21/21   Regalado, Belkys A, MD  Fiber Adult Gummies 2 g CHEW Chew 4 g by mouth daily.    [provider]  hydrochlorothiazide  (HYDRODIURIL ) 12.5 MG tablet Take 12.5 mg by mouth daily.    [provider]  levothyroxine  (SYNTHROID ) 75 MCG tablet Take 75 mcg by mouth daily before  breakfast.    [provider]  lidocaine  4 % Place 1 patch onto the skin daily. Apply to lower back for back pain    [provider]  Melatonin 10 MG CAPS Take 10 mg by mouth at bedtime.    [provider]  OVER THE COUNTER MEDICATION Take 600 mcg by mouth daily. Vitamin B 600 mcg    [provider]  pantoprazole  (PROTONIX ) 40 MG tablet Take 1 tablet (40 mg total) by mouth daily. 03/15/22 04/14/22  Christobal Guadalajara, MD  rosuvastatin  (CRESTOR ) 40 MG tablet Take 40 mg by mouth at bedtime.    [provider]  Sodium Phosphates (FLEET ENEMA RE) Place 1 Dose rectally once.    [provider]  traMADol  (ULTRAM ) 50 MG tablet Take 50 mg by mouth every 6 (six) hours as needed for moderate pain.    [provider]  traZODone (DESYREL) 50 MG tablet Take 50 mg by mouth at bedtime.    [provider]    Allergies: Patient has no known allergies.    Review of Systems  Gastrointestinal:  Positive for abdominal pain.  All other systems reviewed and are negative.   Updated Vital Signs BP (!) 144/61 (BP Location: Right Arm)   Pulse 60   Temp 97.6 F (36.4 C) (Oral)   Resp (!) 22  Ht 5' 2 (1.575 m)   Wt 60.1 kg   SpO2 98%   BMI 24.23 kg/m   Physical Exam Vitals and nursing note reviewed.  Constitutional:      Comments: Chronically ill and slightly uncomfortable  HENT:     Head: Normocephalic.  Eyes:     Extraocular Movements: Extraocular movements intact.     Pupils: Pupils are equal, round, and reactive to light.  Cardiovascular:     Rate and Rhythm: Normal rate and regular rhythm.  Pulmonary:     Effort: Pulmonary effort is normal.     Breath sounds: Normal breath sounds.  Abdominal:     General: Abdomen is flat.     Comments: Mild left upper quadrant tenderness  Skin:    General: Skin is warm.  Neurological:     General: No focal deficit present.     Mental Status: She is oriented to person, place, and time.   Psychiatric:        Mood and Affect: Mood normal.        Behavior: Behavior normal.     (all labs ordered are listed, but only abnormal results are displayed) Labs Reviewed  CBC WITH DIFFERENTIAL/PLATELET - Abnormal; Notable for the following components:      Result Value   RBC 3.75 (*)    Hemoglobin 10.9 (*)    HCT 35.6 (*)    Platelets 135 (*)    All other components within normal limits  COMPREHENSIVE METABOLIC PANEL WITH GFR  LIPASE, BLOOD  URINALYSIS, W/ REFLEX TO CULTURE (INFECTION SUSPECTED)  TROPONIN I (HIGH SENSITIVITY)    EKG: EKG Interpretation Date/Time:  Sunday November 03 2023 18:13:27 EDT Ventricular Rate:  60 PR Interval:  191 QRS Duration:  106 QT Interval:  476 QTC Calculation: 476 R Axis:   79  Text Interpretation: Sinus rhythm RSR' in V1 or V2, probably normal variant No significant change since last tracing Confirmed by Patt Alm DEL [phone]) on 11/03/2023 6:23:23 PM  Radiology: [facility] Chest Port 1 View Result Date: 11/03/2023 CLINICAL DATA:  Chest and left upper quadrant pain since this morning EXAM: PORTABLE CHEST 1 VIEW COMPARISON:  03/13/2022, 03/14/2022 FINDINGS: Single frontal view of the chest demonstrates dual lead pacer overlying left chest. Stable enlargement of the cardiac silhouette. Large hiatal hernia is again noted, unchanged. No acute airspace disease, effusion, or pneumothorax. No acute bony abnormalities. IMPRESSION: 1. Stable enlarged cardiac silhouette. 2. Large hiatal hernia. 3. No acute airspace disease. Electronically Signed   By: Ozell Daring M.D.   On: 11/03/2023 18:48     Procedures   Medications Ordered in the ED  fentaNYL  (SUBLIMAZE ) injection 50 mcg (50 mcg Intravenous Given 11/03/23 1829)  ondansetron  (ZOFRAN ) injection 4 mg (4 mg Intravenous Given 11/03/23 1829)                                    Medical Decision Making Danielle Blanchard is a 83 y.o. female here presenting with left upper quadrant pain.  Consider constipation versus  colitis versus diverticulitis.  Less likely to be ACS.  Plan to get CBC CMP and troponin x 2 and lipase and CT abdomen pelvis.  Will give pain medicine and reassess  10:43 PM Reviewed patient's labs and potassium is 3.0 was supplemented.  CT abdomen pelvis showed large hiatal hernia.  Troponin negative x 2.  Pain is under control.  Patient can follow-up with  general surgery outpatient.  Will prescribe pain medicine  Problems Addressed: Hiatal hernia: acute illness or injury Hypokalemia: acute illness or injury  Amount and/or Complexity of Data Reviewed Labs: ordered. Decision-making details documented in ED Course. Radiology: ordered. ECG/medicine tests: ordered.  Risk Prescription drug management.     Final diagnoses:  None    ED Discharge Orders     None          Patt Alm Macho, MD 11/03/23 2248

## 2023-11-03 NOTE — Discharge Instructions (Addendum)
 Take potassium 20 mg daily.  You have a large hiatal hernia that is causing your pain.  I have prescribed tramadol  50 mg as needed.  Please call general surgery for follow-up regarding your hernia  You need to repeat potassium level in a week  Return to ER if you have severe abdominal pain or vomiting

## 2023-11-03 NOTE — ED Triage Notes (Signed)
 Pt BIB [facility] from [facility] for LUQ pain since this AM.

## 2023-11-04 ENCOUNTER — Other Ambulatory Visit (HOSPITAL_COMMUNITY): Payer: Self-pay

## 2023-11-04 NOTE — ED Notes (Signed)
 Discharge instructions reviewed with patient. Patient questions answered and opportunity for education reviewed. Patient voices understanding of discharge instructions with no further question. Attempted to call report to facility x3 with no answer.

## 2023-11-14 ENCOUNTER — Other Ambulatory Visit (HOSPITAL_COMMUNITY): Payer: Self-pay

## 2023-12-09 IMAGING — CR DG CHEST 2V
2 series · 2 of 2 positions shown · non-contrast
Comparison: March 09, 2021.

CLINICAL DATA: Leukocytosis.

EXAM:
CHEST - 2 VIEW

[chest lat]
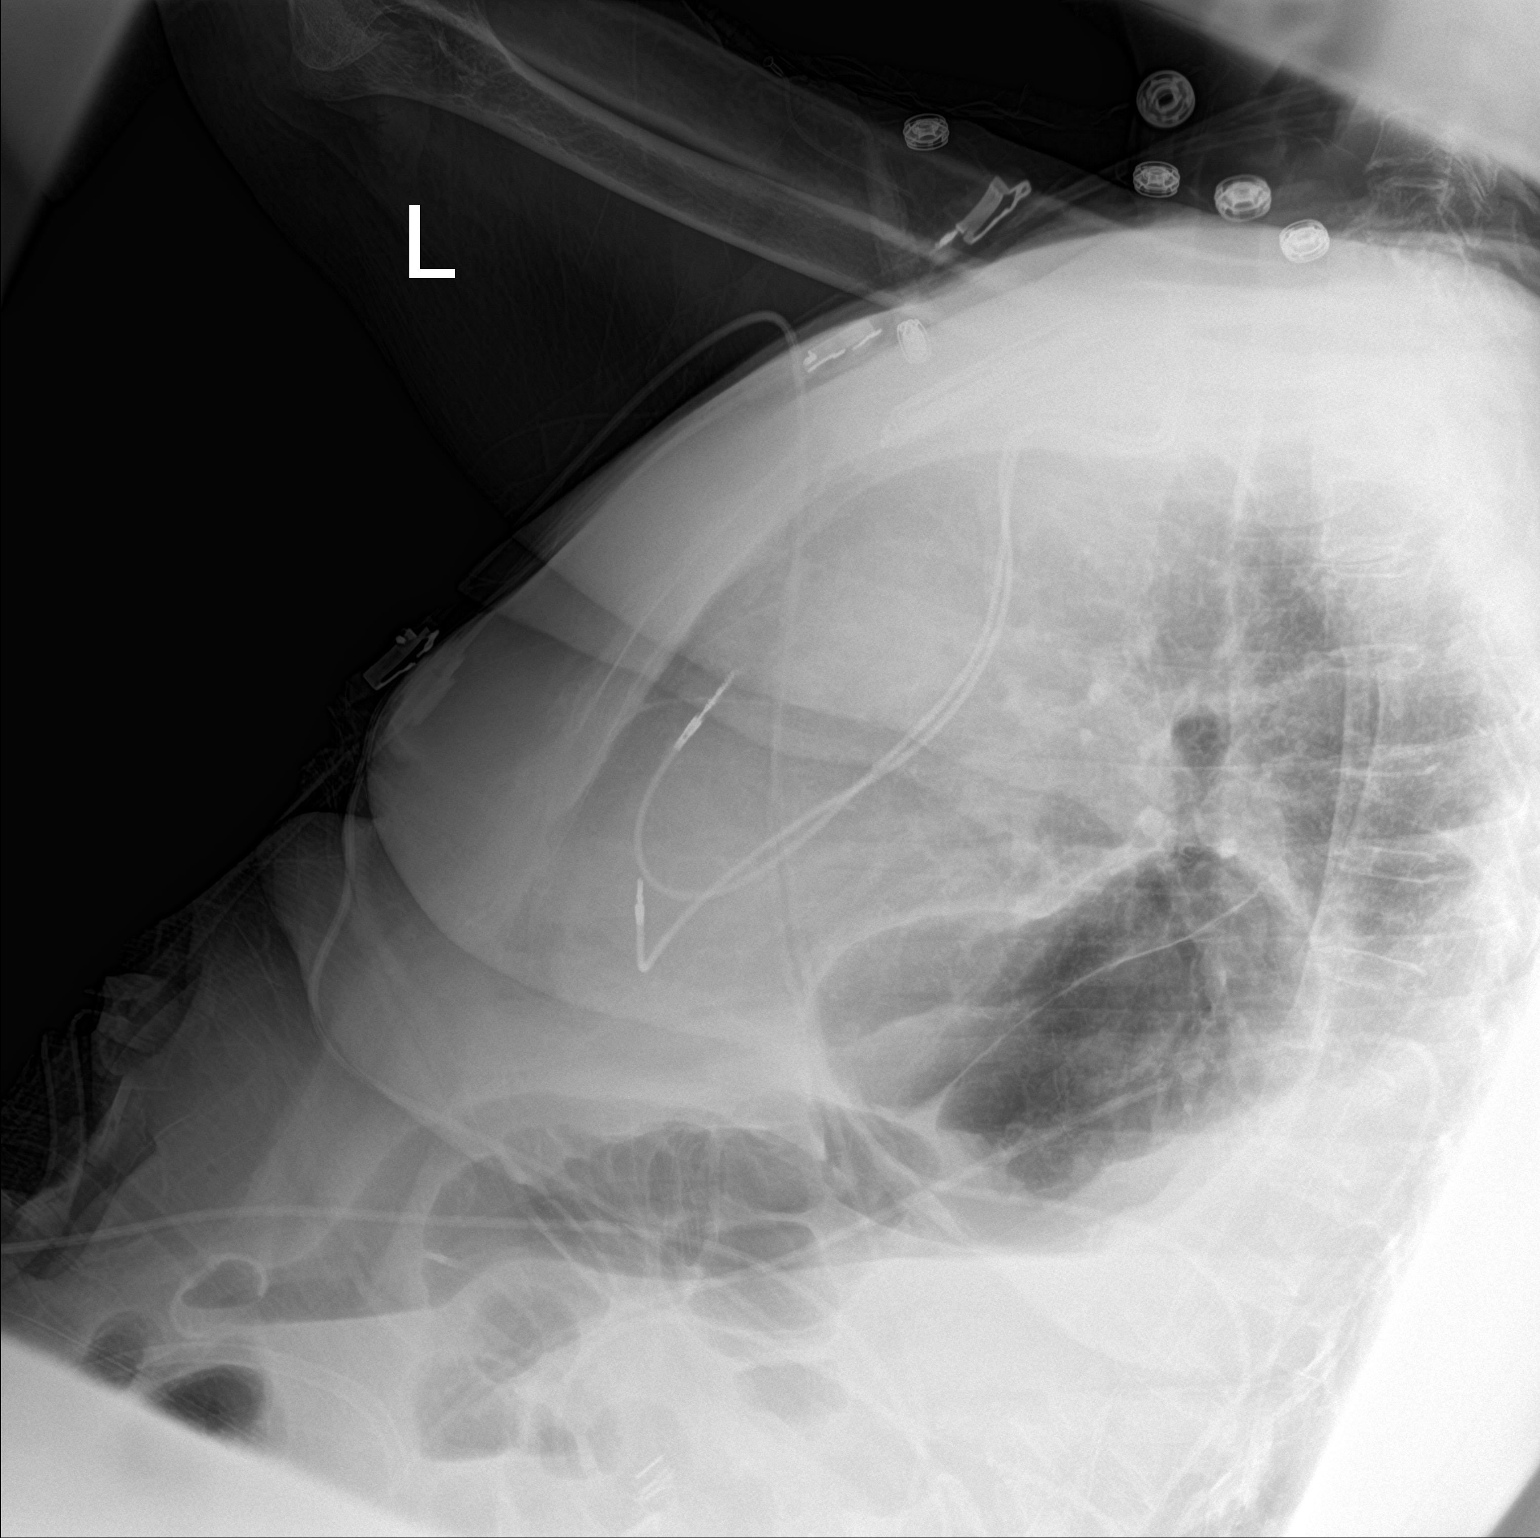

[chest ap]
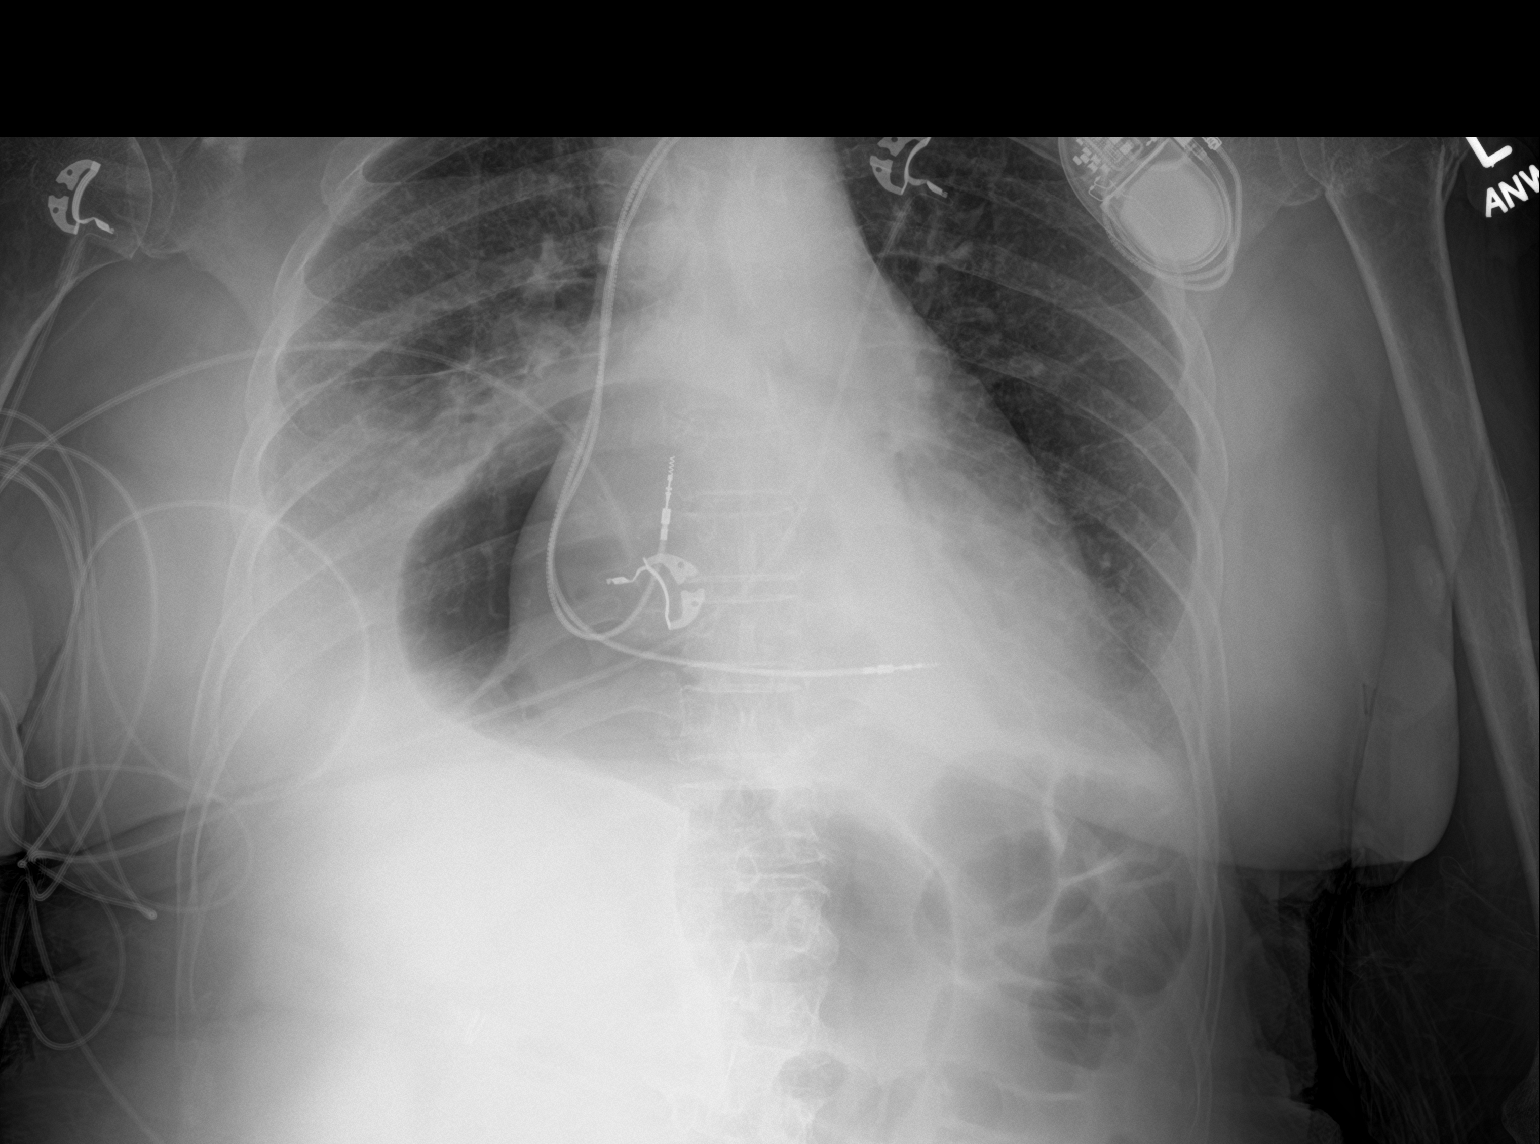

[2 of 2 positions shown; findings below may reference images not displayed]

FINDINGS: Stable cardiomegaly. Left-sided pacemaker is unchanged in position.
No pneumothorax is noted. Large hiatal hernia is again noted.
Moderate right basilar opacity is noted concerning for atelectasis
with associated pleural effusion. Mild left basilar subsegmental
atelectasis is noted. Bony thorax is unremarkable.
IMPRESSION: Large hiatal hernia is noted. Moderate right basilar opacity is now
noted concerning for atelectasis with associated pleural effusion.

## 2023-12-13 IMAGING — CR DG ABDOMEN 1V
1 series · 1 of 1 positions shown · non-contrast
Comparison: 02/14/2021

CLINICAL DATA: Persistent abdominal pain and swelling for several
weeks

EXAM:
ABDOMEN - 1 VIEW

[abdomen kub]
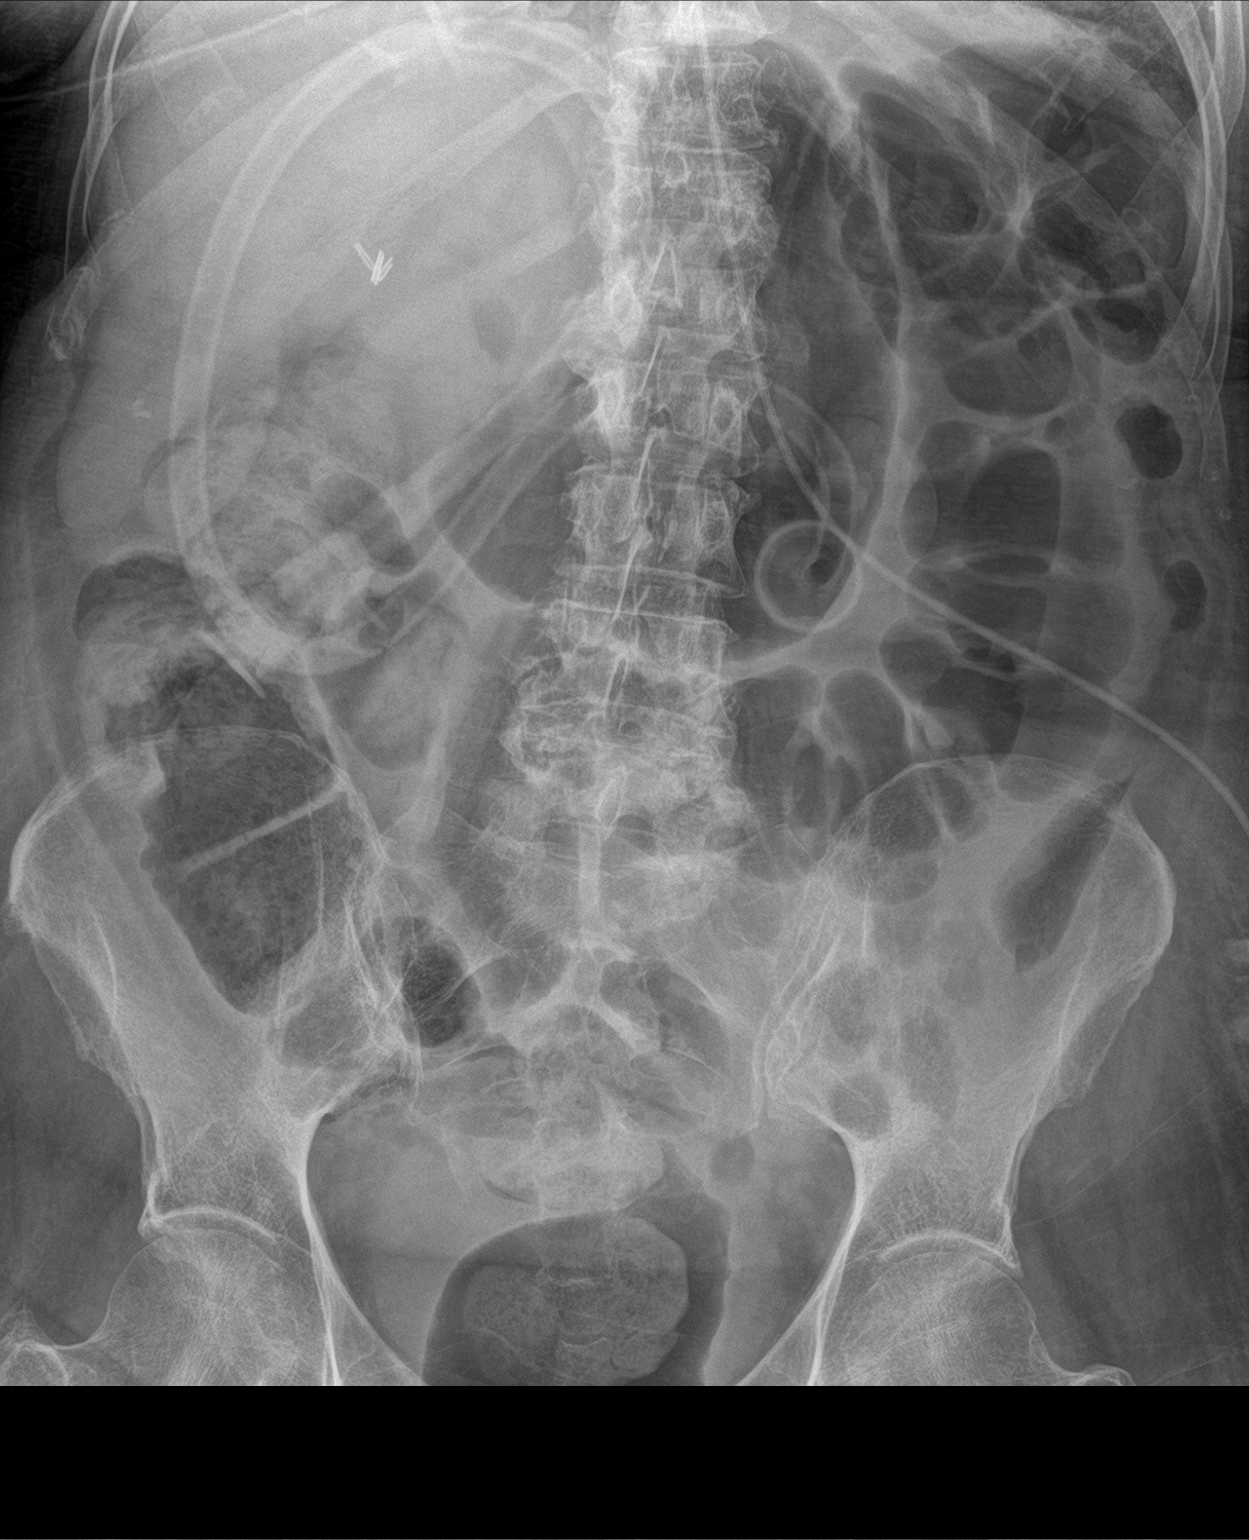

[1 of 1 positions shown; findings below may reference images not displayed]

FINDINGS: Gastrostomy tube projects over stomach.

Mild gas within stomach.

Nonobstructive bowel gas pattern with gas and stool throughout colon
to rectum.

Nondistended small bowel.

No bowel dilatation or bowel wall thickening.

Bones demineralized with degenerative disc and facet disease changes
of thoracolumbar spine.

No urine tract calcification.

Surgical clips RIGHT upper quadrant question cholecystectomy.
IMPRESSION: No acute abnormalities.

## 2024-05-16 IMAGING — CT CT ANGIO CHEST-ABD-PELV FOR DISSECTION W/ AND WO/W CM
2 of 7 series · 14 of 46 positions shown, 16 images · non-contrast
Comparison: CT abdomen pelvis dated 06/03/2021.

CLINICAL DATA: Chest and back pain.  Concern for aortic dissection.

EXAM:
CT ANGIOGRAPHY CHEST, ABDOMEN AND PELVIS
TECHNIQUE: Non-contrast CT of the chest was initially obtained.

[Series 6: arterial · axial · arterial · 0.72mm/px · z∈[-478,+48]mm · 11 of 297 slices shown, 13 images]
[im 17/297  soft-tissue]
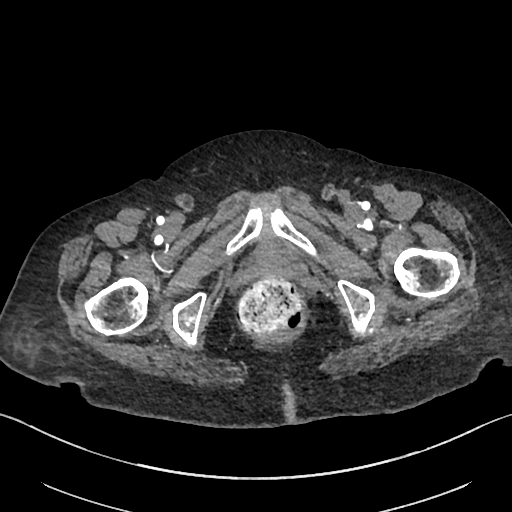
[im 17/297  bone]
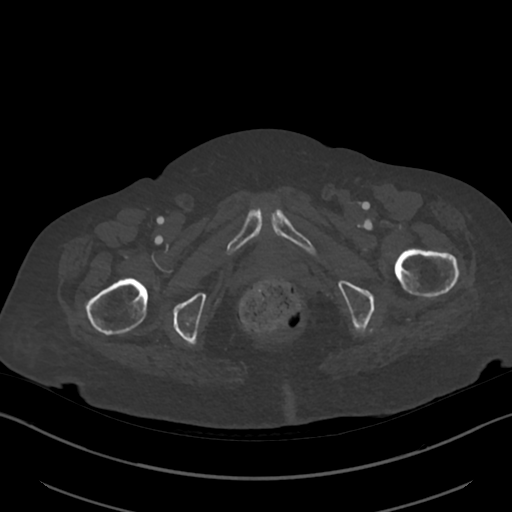
[im 50/297  soft-tissue]
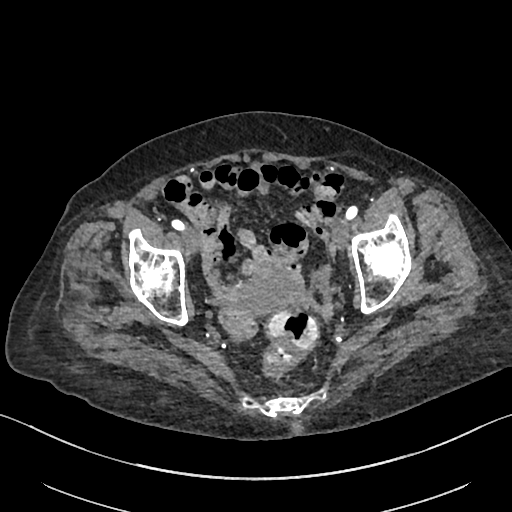
[im 66/297  soft-tissue]
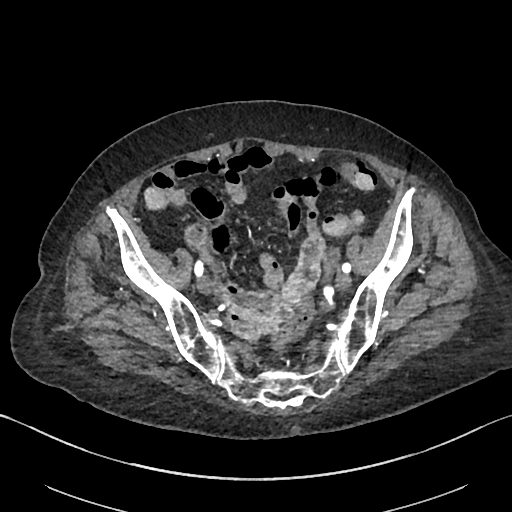
[im 99/297  soft-tissue]
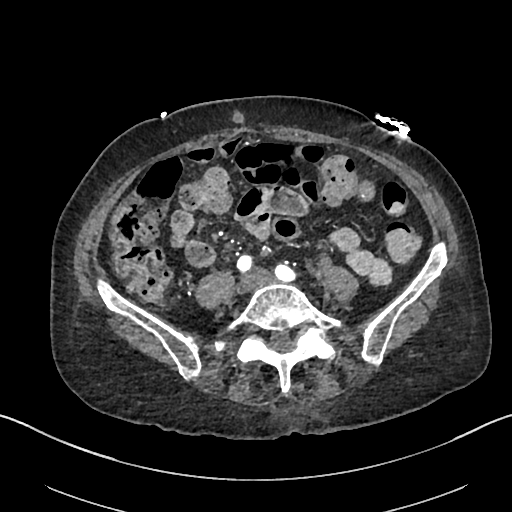
[im 116/297  soft-tissue]
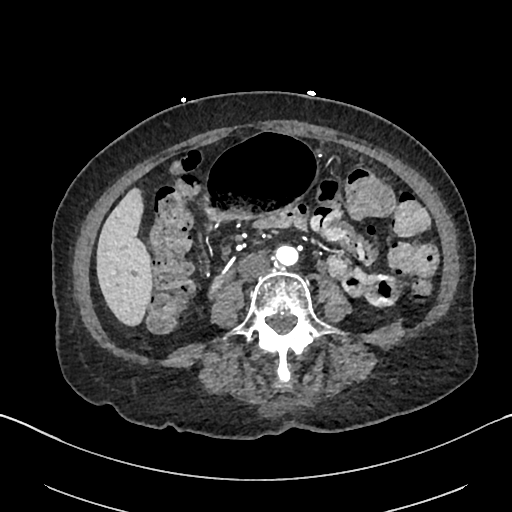
[im 149/297  soft-tissue]
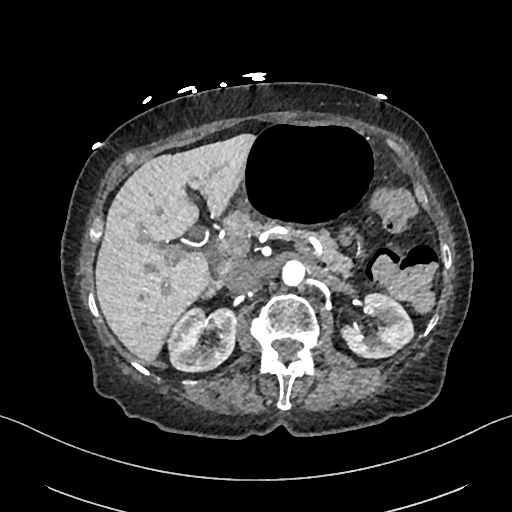
[im 181/297  soft-tissue]
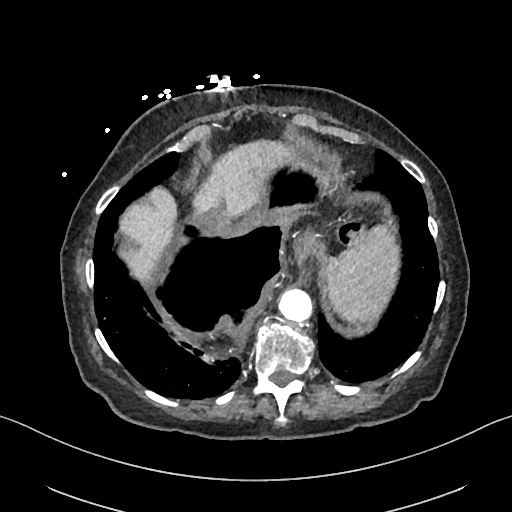
[im 198/297  soft-tissue]
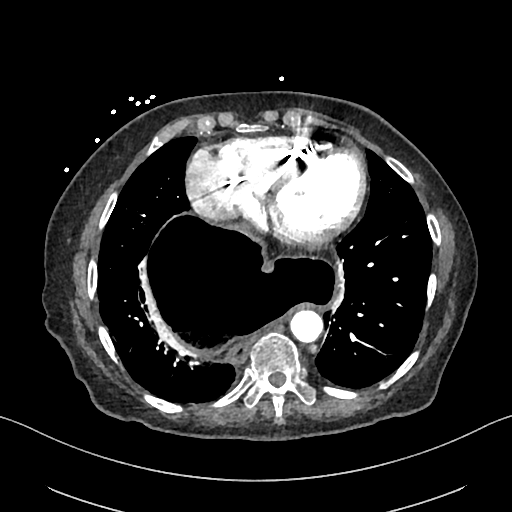
[im 231/297  soft-tissue]
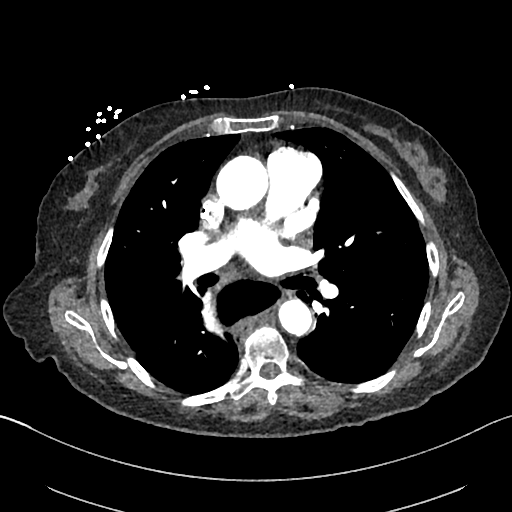
[im 231/297  bone]
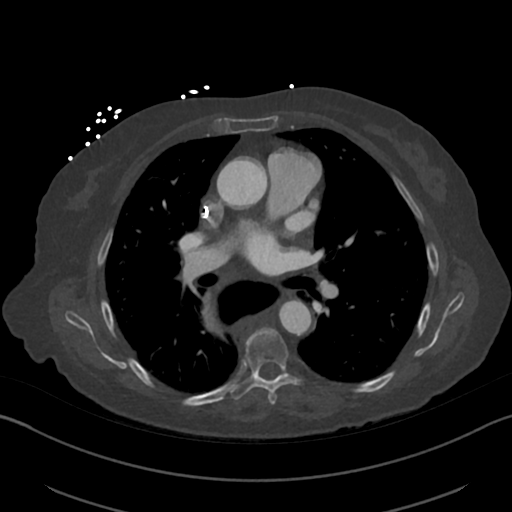
[im 247/297  soft-tissue]
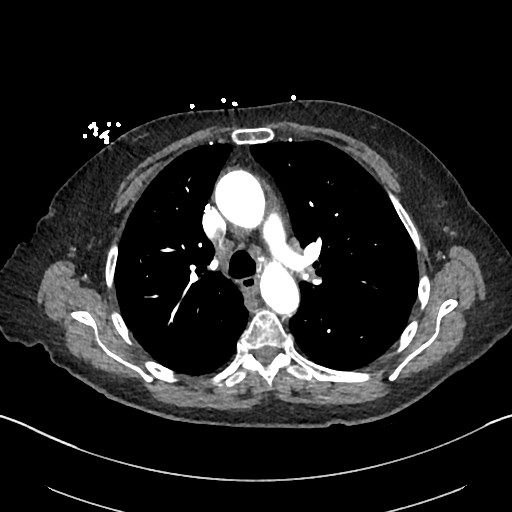
[im 280/297  soft-tissue]
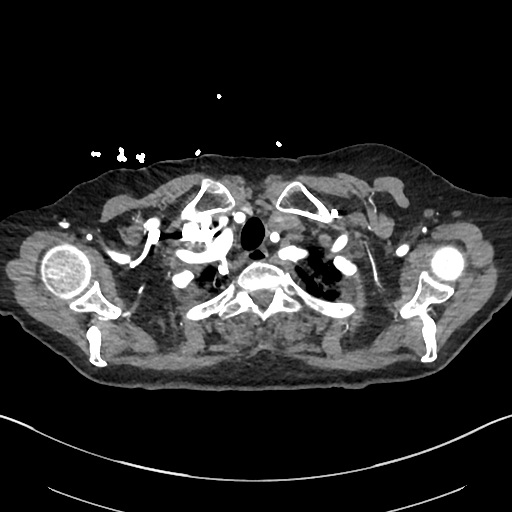

[Series 10: cor · coronal · 0.82mm/px · 3 of 132 slices shown]
[im 33/132  soft-tissue]
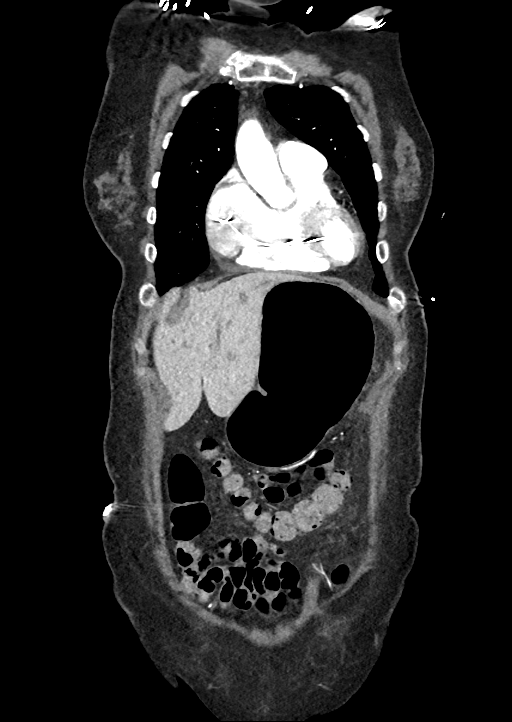
[im 66/132  soft-tissue]
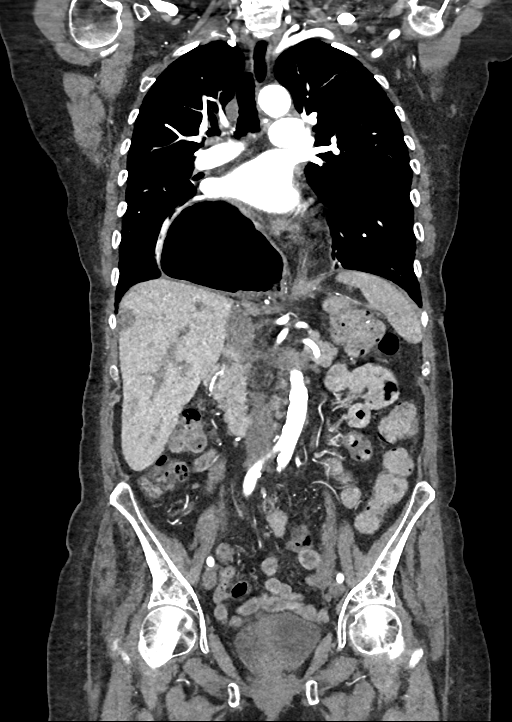
[im 99/132  soft-tissue]
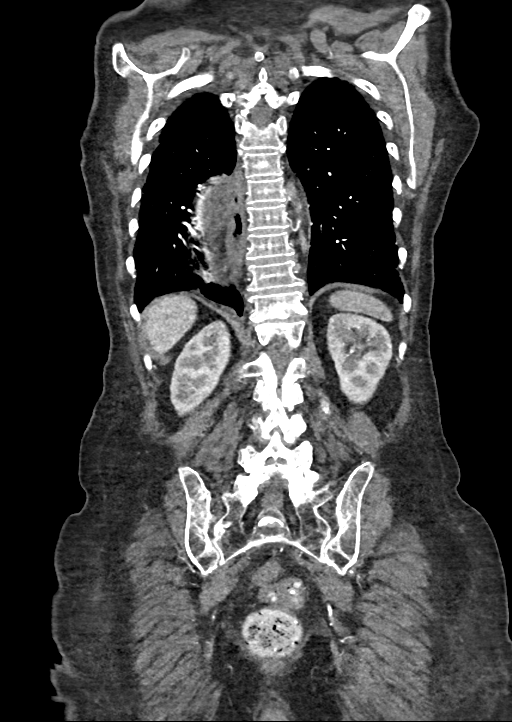

[14 of 46 positions shown; findings below may reference images not displayed]

Multidetector CT imaging through the chest, abdomen and pelvis was
performed using the standard protocol during bolus administration of
intravenous contrast. Multiplanar reconstructed images and MIPs were
obtained and reviewed to evaluate the vascular anatomy.

RADIATION DOSE REDUCTION: This exam was performed according to the
departmental dose-optimization program which includes automated
exposure control, adjustment of the mA and/or kV according to
patient size and/or use of iterative reconstruction technique.

CONTRAST:  80mL OMNIPAQUE IOHEXOL 350 MG/ML SOLN
FINDINGS: CTA CHEST FINDINGS

Cardiovascular: There is no cardiomegaly or pericardial effusion.
Mild atherosclerotic calcification of the thoracic aorta. No
aneurysmal dilatation or dissection. The origins of the great
vessels of the aortic arch appear patent. No pulmonary artery
embolus identified. Left-sided Port-A-Cath noted.

Mediastinum/Nodes: No hilar or mediastinal adenopathy. There is a
large hiatal hernia containing a portion of the stomach. The
esophagus is grossly unremarkable. No mediastinal fluid collection.

Lungs/Pleura: No focal consolidation, pleural effusion, or
pneumothorax. The central airways are patent.

Musculoskeletal: There is osteopenia with degenerative changes of
the spine. No acute osseous pathology. Old T12 compression fracture
with anterior wedging.

Review of the MIP images confirms the above findings.

CTA ABDOMEN AND PELVIS FINDINGS

VASCULAR

Aorta: Mild atherosclerotic calcification. No aneurysmal dilatation
or dissection. No periaortic fluid collection.

Celiac: Patent without evidence of aneurysm, dissection, vasculitis
or significant stenosis.

SMA: Patent without evidence of aneurysm, dissection, vasculitis or
significant stenosis.

Renals: Both renal arteries are patent without evidence of aneurysm,
dissection, vasculitis, fibromuscular dysplasia or significant
stenosis.

IMA: Patent without evidence of aneurysm, dissection, vasculitis or
significant stenosis.

Inflow: Mild atherosclerotic calcification. The iliac arteries are
patent. No aneurysmal dilatation or dissection.

Veins: No obvious venous abnormality within the limitations of this
arterial phase study.

Review of the MIP images confirms the above findings.

NON-VASCULAR

No intra-abdominal free air or free fluid.

Hepatobiliary: The liver is unremarkable. Mild biliary ductal
dilatation, post cholecystectomy. No retained calcified stone noted
in the central CBD.

Pancreas: Unremarkable. No pancreatic ductal dilatation or
surrounding inflammatory changes.

Spleen: Normal in size without focal abnormality.

Adrenals/Urinary Tract: The adrenal glands are unremarkable. The
kidneys, visualized ureters, and urinary bladder appear
unremarkable.

Stomach/Bowel: There is a large hiatal hernia containing a portion
of the stomach similar to prior CT. No evidence of gastric outlet
obstruction. There is large amount of stool throughout the colon.
There are scattered distal colonic diverticula without active
inflammatory changes. There is no bowel obstruction or active
inflammation. The appendix is not visualized with certainty. No
inflammatory changes identified in the right lower quadrant.

Lymphatic: No adenopathy.

Reproductive: The uterus is retroverted.  No adnexal masses.

Other: None

Musculoskeletal: Osteopenia with degenerative changes of the spine.
Grade 1 L4-L5 anterolisthesis. No acute osseous pathology.

Review of the MIP images confirms the above findings.
IMPRESSION: 1. No acute intrathoracic, abdominal, or pelvic pathology. No aortic
dissection or aneurysm.
2. Large hiatal hernia containing a portion of the stomach, similar
to prior CT. No evidence of gastric outlet obstruction.
3. Colonic diverticulosis without active inflammatory changes.
